# Patient Record
Sex: Female | Born: 1945 | Race: White | Hispanic: No | Smoking: Never smoker
Health system: Southern US, Community
[De-identification: ages and names within clinical notes are randomized; demographics above are authoritative.]

## PROBLEM LIST (undated history)

## (undated) DIAGNOSIS — K219 Gastro-esophageal reflux disease without esophagitis: Secondary | ICD-10-CM

## (undated) DIAGNOSIS — E785 Hyperlipidemia, unspecified: Secondary | ICD-10-CM

## (undated) DIAGNOSIS — E119 Type 2 diabetes mellitus without complications: Secondary | ICD-10-CM

## (undated) DIAGNOSIS — I10 Essential (primary) hypertension: Secondary | ICD-10-CM

## (undated) HISTORY — PX: ABDOMINAL HYSTERECTOMY: SHX81

## (undated) HISTORY — PX: CHOLECYSTECTOMY: SHX55

## (undated) HISTORY — DX: Hyperlipidemia, unspecified: E78.5

---

## 1998-12-17 ENCOUNTER — Emergency Department (HOSPITAL_COMMUNITY): Admission: EM | Admit: 1998-12-17 | Discharge: 1998-12-17 | Payer: Self-pay | Admitting: Emergency Medicine

## 2002-05-21 ENCOUNTER — Emergency Department (HOSPITAL_COMMUNITY): Admission: EM | Admit: 2002-05-21 | Discharge: 2002-05-21 | Payer: Self-pay | Admitting: Emergency Medicine

## 2002-05-21 ENCOUNTER — Encounter: Payer: Self-pay | Admitting: Emergency Medicine

## 2003-08-25 ENCOUNTER — Emergency Department (HOSPITAL_COMMUNITY): Admission: EM | Admit: 2003-08-25 | Discharge: 2003-08-25 | Payer: Self-pay | Admitting: Emergency Medicine

## 2003-12-30 ENCOUNTER — Encounter (INDEPENDENT_AMBULATORY_CARE_PROVIDER_SITE_OTHER): Payer: Self-pay | Admitting: *Deleted

## 2003-12-30 ENCOUNTER — Ambulatory Visit (HOSPITAL_COMMUNITY): Admission: RE | Admit: 2003-12-30 | Discharge: 2003-12-30 | Payer: Self-pay | Admitting: Gastroenterology

## 2005-04-26 ENCOUNTER — Emergency Department (HOSPITAL_COMMUNITY): Admission: EM | Admit: 2005-04-26 | Discharge: 2005-04-27 | Payer: Self-pay | Admitting: Emergency Medicine

## 2005-05-06 ENCOUNTER — Emergency Department (HOSPITAL_COMMUNITY): Admission: EM | Admit: 2005-05-06 | Discharge: 2005-05-06 | Payer: Self-pay | Admitting: Emergency Medicine

## 2009-07-07 ENCOUNTER — Encounter: Admission: RE | Admit: 2009-07-07 | Discharge: 2009-07-07 | Payer: Self-pay | Admitting: Family Medicine

## 2009-07-08 ENCOUNTER — Ambulatory Visit (HOSPITAL_COMMUNITY): Admission: RE | Admit: 2009-07-08 | Discharge: 2009-07-08 | Payer: Self-pay | Admitting: Family Medicine

## 2009-08-04 ENCOUNTER — Encounter: Admission: RE | Admit: 2009-08-04 | Discharge: 2009-08-04 | Payer: Self-pay | Admitting: Family Medicine

## 2010-02-16 ENCOUNTER — Encounter: Admission: RE | Admit: 2010-02-16 | Discharge: 2010-02-16 | Payer: Self-pay | Admitting: Otolaryngology

## 2010-08-14 NOTE — Op Note (Signed)
[name], [name]               ACCOUNT NO.:  [id number]   MEDICAL RECORD NO.:  [id number]          PATIENT TYPE:  AMB   LOCATION:  ENDO                         FACILITY:  [facility]   PHYSICIAN:  Anselmo Rod, M.D.  DATE OF BIRTH:  1945-04-15   DATE OF PROCEDURE:  12/30/2003  DATE OF DISCHARGE:                                 OPERATIVE REPORT   PROCEDURE PERFORMED:  Colonoscopy with snare polypectomy x1.   ENDOSCOPIST:  Anselmo Rod, M.D.   INSTRUMENT USED:  Olympus video colonoscope.   INDICATION FOR PROCEDURE:  [age]-year-old white female undergoing  screening colonoscopy to rule out colon polyps, masses, etc.   PREPROCEDURE PREPARATION:  Informed consent was procured from the patient.  The patient was fasted for 8 hours prior to the procedure and prepped with a  bottle of magnesium citrate and a gallon of GoLYTELY the night prior to the  procedure.   PREPROCEDURE PHYSICAL:  VITAL SIGNS:  The patient had stable vital signs.  NECK:  Neck supple.  CHEST:  Chest clear to auscultation.  S1 and S2 regular.  ABDOMEN:  Abdomen soft with normal bowel sounds.  No hepatosplenomegaly and  no masses palpable.   DESCRIPTION OF THE PROCEDURE:  The patient was placed in the left lateral  decubitus position and sedated with 75 mg of Demerol and 7.5 mg of Versed in  slow incremental doses.  Once the patient was adequately sedated and  maintained on low-flow oxygen and continuous cardiac monitoring, the Olympus  video colonoscope was advanced from the rectum to the cecum.  The  appendiceal orifice and the ileocecal valve were visualized and  photographed.  The patient's position was changed from the left lateral to  the supine and the right lateral position with gentle application of  abdominal pressure to reach the cecum.  Because of the patient's body  habitus and somewhat tortuous colon, the procedure was somewhat difficult.  A small flat polyp was snared from the proximal right  colon by hot snare.  There was extensive sigmoid diverticulosis with inspissated stool in several  of the diverticula.  Small internal hemorrhoids were seen on retroflexion.  The patient tolerated the procedure well without complications.   IMPRESSION:  1.  Small flat polyp snared from the proximal right colon by hot snare.  2.  Extensive sigmoid diverticulosis.  3.  Small internal hemorrhoids.   RECOMMENDATIONS:  1.  Continue a high-fiber diet with liberal fluid intake.  2.  Await pathology results.  3.  Avoid nonsteroidals including aspirin for the next 4 weeks.  4.  Outpatient followup in the next 2 weeks for further recommendations on      diverticulosis.       JNM/MEDQ  D:  12/30/2003  T:  12/30/2003  Job:  [id number]   cc:   Derenda Mis, [address] [place], [place], [place].D.

## 2010-08-14 NOTE — Op Note (Signed)
Jasmine Cohen, Jasmine Cohen               ACCOUNT NO.:  [id number]   MEDICAL RECORD NO.:  [id number]          PATIENT TYPE:  AMB   LOCATION:  ENDO                         FACILITY:  [facility]   PHYSICIAN:  Anselmo Rod, M.D.  DATE OF BIRTH:  1945-06-30   DATE OF PROCEDURE:  12/30/2003  DATE OF DISCHARGE:                                 OPERATIVE REPORT   PROCEDURE PERFORMED:  Colonoscopy with snare polypectomy x1.   ENDOSCOPIST:  Anselmo Rod, M.D.   INSTRUMENT USED:  Olympus video colonoscope.   INDICATIONS FOR PROCEDURE:  A 65 year old white female who underwent  screening colonoscopy.  The patient has occasional bright red blood per  rectum which she attributes to her hemorrhoids.   PREPROCEDURE PREPARATION:  Informed consent was procured from the patient.  The patient fasted for eight hours prior to the procedure and prepped with a  bottle of magnesium citrate and a gallop of GoLYTELY the night prior to the  procedure.   PREPROCEDURE PHYSICAL:  VITAL SIGNS:  Stable vital signs.  NECK:  Supple.  CHEST:  Clear to auscultation.  CARDIOVASCULAR:  S1 and S2 regular.  ABDOMEN:  Soft with normal bowel sounds.   DESCRIPTION OF PROCEDURE:  The patient was placed in left lateral decubitus  position, sedated with 75 mg of Demerol and 7.5 mg of Versed in slow  incremental doses.  Once the patient was adequately sedated and maintained  on low flow oxygen and continuous cardiac monitoring, the Olympus video  colonoscope was advanced from the rectum to the cecum with difficulty  because of the patient's ...   Dictation ended at this point.       JNM/MEDQ  D:  12/30/2003  T:  12/30/2003  Job:  [id number]   cc:   Derrek Gu, M.D.  [facility]  [address]

## 2012-11-27 ENCOUNTER — Encounter (HOSPITAL_COMMUNITY): Payer: Self-pay | Admitting: *Deleted

## 2012-11-27 ENCOUNTER — Emergency Department (HOSPITAL_COMMUNITY)
Admission: EM | Admit: 2012-11-27 | Discharge: 2012-11-27 | Disposition: A | Discharge status: Home / Self Care | Payer: Medicare Other | Attending: Emergency Medicine | Admitting: Emergency Medicine

## 2012-11-27 ENCOUNTER — Emergency Department (HOSPITAL_COMMUNITY): Payer: Medicare Other

## 2012-11-27 DIAGNOSIS — I10 Essential (primary) hypertension: Secondary | ICD-10-CM | POA: Insufficient documentation

## 2012-11-27 DIAGNOSIS — E1169 Type 2 diabetes mellitus with other specified complication: Secondary | ICD-10-CM | POA: Insufficient documentation

## 2012-11-27 DIAGNOSIS — Y929 Unspecified place or not applicable: Secondary | ICD-10-CM | POA: Insufficient documentation

## 2012-11-27 DIAGNOSIS — Y9389 Activity, other specified: Secondary | ICD-10-CM | POA: Insufficient documentation

## 2012-11-27 DIAGNOSIS — S91109A Unspecified open wound of unspecified toe(s) without damage to nail, initial encounter: Secondary | ICD-10-CM | POA: Insufficient documentation

## 2012-11-27 DIAGNOSIS — S8002XA Contusion of left knee, initial encounter: Secondary | ICD-10-CM

## 2012-11-27 DIAGNOSIS — W06XXXA Fall from bed, initial encounter: Secondary | ICD-10-CM | POA: Insufficient documentation

## 2012-11-27 DIAGNOSIS — S8000XA Contusion of unspecified knee, initial encounter: Secondary | ICD-10-CM | POA: Insufficient documentation

## 2012-11-27 DIAGNOSIS — W19XXXA Unspecified fall, initial encounter: Secondary | ICD-10-CM

## 2012-11-27 DIAGNOSIS — S91209A Unspecified open wound of unspecified toe(s) with damage to nail, initial encounter: Secondary | ICD-10-CM

## 2012-11-27 DIAGNOSIS — W1809XA Striking against other object with subsequent fall, initial encounter: Secondary | ICD-10-CM | POA: Insufficient documentation

## 2012-11-27 HISTORY — DX: Type 2 diabetes mellitus without complications: E11.9

## 2012-11-27 HISTORY — DX: Essential (primary) hypertension: I10

## 2012-11-27 MED ORDER — ACETAMINOPHEN 325 MG PO TABS
650.0000 mg | ORAL_TABLET | Freq: Once | ORAL | Status: AC
  Administered 2012-11-27: 650 mg via ORAL
  Filled 2012-11-27: qty 2

## 2012-11-27 MED ORDER — TRAMADOL HCL 50 MG PO TABS: 50.0000 mg | ORAL_TABLET | Freq: Three times a day (TID) | ORAL | Status: DC | PRN

## 2012-11-27 NOTE — ED Notes (Signed)
Patient transported to X-ray 

## 2012-11-27 NOTE — ED Notes (Signed)
Romeo Apple, MD, placed toenail into appropriate location and bound toe. MD instructed patient on care of toe and follow-up with a primary care doctor.

## 2012-11-27 NOTE — ED Provider Notes (Signed)
CSN: [id number]     Arrival date & time 11/27/12  3086 History   First MD Initiated Contact with Patient 11/27/12 [phone]     Chief Complaint  Patient presents with  . Foot Injury  . Knee Pain   (Consider location/radiation/quality/duration/timing/severity/associated sxs/prior Treatment) Patient is a 67 y.o. female presenting with foot injury and knee pain. The history is provided by the patient.  Foot Injury Location:  Foot and knee Injury: yes   Knee location:  L knee Foot location:  L foot Pain details:    Quality:  Aching   Radiates to:  Does not radiate   Severity:  Moderate   Onset quality:  Sudden   Duration:  1 hour   Timing:  Constant   Progression:  Unchanged Chronicity:  New Dislocation: no   Foreign body present:  No foreign bodies Prior injury to area: to foot yet, to knee no. Relieved by:  Nothing Ineffective treatments:  None tried Associated symptoms: no back pain, no fatigue, no fever, no neck pain and no numbness   Knee Pain Associated symptoms: no back pain, no fatigue, no fever, no neck pain and no numbness     Past Medical History  Diagnosis Date  . Hypertension   . Diabetes mellitus without complication    Past Surgical History  Procedure Laterality Date  . Cholecystectomy    . Abdominal hysterectomy     No family history on file. History  Substance Use Topics  . Smoking status: Never Smoker   . Smokeless tobacco: Not on file  . Alcohol Use: No   OB History   Grav Para Term Preterm Abortions TAB SAB Ect Mult Living                 Review of Systems  Constitutional: Negative for fever and fatigue.  HENT: Negative for congestion, drooling and neck pain.   Eyes: Negative for pain.  Respiratory: Negative for cough and shortness of breath.   Cardiovascular: Negative for chest pain.  Gastrointestinal: Negative for nausea, vomiting, abdominal pain and diarrhea.  Genitourinary: Negative for dysuria and hematuria.  Musculoskeletal: Negative for  back pain and gait problem.  Skin: Negative for color change.  Neurological: Negative for dizziness and headaches.  Hematological: Negative for adenopathy.  Psychiatric/Behavioral: Negative for behavioral problems.  All other systems reviewed and are negative.    Allergies  Codeine  Home Medications  No current outpatient prescriptions on file. BP 148/52  Pulse 76  Temp(Src) 98.6 F (37 C) (Oral)  Resp 20  SpO2 100% Physical Exam  Nursing note and vitals reviewed. Constitutional: She is oriented to person, place, and time. She appears well-developed and well-nourished.  HENT:  Head: Normocephalic.  Mouth/Throat: No oropharyngeal exudate.  Eyes: Conjunctivae and EOM are normal. Pupils are equal, round, and reactive to light.  Neck: Normal range of motion. Neck supple.  No vertebral ttp.   Cardiovascular: Normal rate, regular rhythm, normal heart sounds and intact distal pulses.  Exam reveals no gallop and no friction rub.   No murmur heard. Pulmonary/Chest: Effort normal and breath sounds normal. No respiratory distress. She has no wheezes.  Abdominal: Soft. Bowel sounds are normal. There is no tenderness. There is no rebound and no guarding.  Musculoskeletal: Normal range of motion. She exhibits no edema and no tenderness.       Feet:  Neurological: She is alert and oriented to person, place, and time.  Skin: Skin is warm and dry.  Psychiatric: She has  a normal mood and affect. Her behavior is normal.    ED Course  Procedures (including critical care time) Labs Review Labs Reviewed - No data to display Imaging Review Dg Pelvis 1-2 Views  11/27/2012   *RADIOLOGY REPORT*  Clinical Data: Fall with pelvic pain.  PELVIS - 1-2 VIEW  Comparison: None.  Findings: No acute fracture or diastasis is seen.  Degenerative changes are seen involving both hips, the sacroiliac joints and the pubic symphysis.  No bony lesions or destruction are identified. Soft tissues are unremarkable.   IMPRESSION: No acute pelvic fracture identified.   Original Report Authenticated By: Irish Lack, M.D.   Dg Tibia/fibula Left  11/27/2012   *RADIOLOGY REPORT*  Clinical Data: Foot injury.  LEFT TIBIA AND FIBULA - 2 VIEW  Comparison: No priors.  Findings: AP and lateral views of the left tibia and fibula demonstrate no acute displaced fractures.  Overlying soft tissues are unremarkable.  IMPRESSION: 1.  No acute radiographic abnormality of the left tibia or fibula.   Original Report Authenticated By: Trudie Reed, M.D.   Dg Ankle Complete Left  11/27/2012   *RADIOLOGY REPORT*  Clinical Data: Fall with left ankle pain.  LEFT ANKLE COMPLETE - 3+ VIEW  Comparison: None.  Findings: Moderate degenerative changes are seen involving the ankle mortise.  There is no evidence of acute fracture or dislocation.  Soft tissues are unremarkable.  No bony lesions are identified.  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  No acute fracture is identified.   Original Report Authenticated By: Irish Lack, M.D.   Dg Knee Complete 4 Views Left  11/27/2012   *RADIOLOGY REPORT*  Clinical Data: Fall with left knee pain.  LEFT KNEE - COMPLETE 4+ VIEW  Comparison: None.  Findings: Advanced osteoarthritis is noted involving all three joint spaces, particularly the lateral joint compartment and the patellofemoral joint.  No evidence of acute fracture, dislocation or joint effusion.  No bony lesions are identified.  Soft tissues are unremarkable.  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  No acute fracture identified.   Original Report Authenticated By: Irish Lack, M.D.   Dg Foot Complete Left  11/27/2012   *RADIOLOGY REPORT*  Clinical Data: Fall with left foot pain.  LEFT FOOT - COMPLETE 3+ VIEW  Comparison: None.  Findings: Degenerative changes are seen involving the toes and tarsal bones.  There is no evidence of acute fracture or dislocation.  Soft tissues are unremarkable.  No  bony lesions or erosions are identified.  IMPRESSION: No acute fracture.  Degenerative changes of the foot are present.   Original Report Authenticated By: Irish Lack, M.D.    MDM   1. Fall, initial encounter   2. Avulsed toenail, initial encounter   3. Contusion of left knee, initial encounter    7:20 AM 67 y.o. female pw accidental fall out of bed while having a nightmare pta. Pt fell onto left knee and left foot. Denies hitting head, no loc, fell onto carpet from 2-3 feet. Pt has partial dislocation of nail on 1st digit, left foot. Will get pain control, imaging to r/o fx, reduction of nail. Tetanus UTD.   I irrigated the nail bed w/ 1 L NS to remove any debris or clots. I reduced the nail plate. Placed the toe in a soft, bulky dressing.   9:10 AM: I interpreted/reviewed the labs and/or imaging which were non-contributory. Will place in hard soled shoe. Tramadol for pain control. I have discussed the diagnosis/risks/treatment options  with the patient and family and believe the pt to be eligible for discharge home to follow-up with pcp in 3-4 days. We also discussed returning to the ED immediately if new or worsening sx occur. We discussed the sx which are most concerning (e.g., worsening pain, fever) that necessitate immediate return. Any new prescriptions provided to the patient are listed below.    Junius Argyle, MD 11/27/12 1014

## 2012-11-27 NOTE — ED Notes (Signed)
Per EMS - pt reports she was having a dream and subsequently fell out of the bed, hit her foot on the floor and ripped her left great toenail almost completely off. Pt also admits to left knee pain. Pt A&Ox4 on arrival. Guaze dressing applied by EMS en route.

## 2012-11-27 NOTE — ED Notes (Signed)
Bed: JY78 Expected date:  Expected time:  Means of arrival:  Comments: EMS left toe nail injury, left knee injury from fall out of bed

## 2012-11-27 NOTE — ED Notes (Signed)
Pt presents w/ partially avulsed left great toenail and c/o left great toe pain and left knee pain. Pt admits to falling out of bed and hitting her toe on the floor. Pt A&Ox4.

## 2012-12-13 ENCOUNTER — Emergency Department (HOSPITAL_COMMUNITY)
Admission: EM | Admit: 2012-12-13 | Discharge: 2012-12-13 | Disposition: A | Discharge status: Home / Self Care | Payer: Medicare Other | Attending: Emergency Medicine | Admitting: Emergency Medicine

## 2012-12-13 ENCOUNTER — Encounter (HOSPITAL_COMMUNITY): Payer: Self-pay | Admitting: *Deleted

## 2012-12-13 ENCOUNTER — Emergency Department (HOSPITAL_COMMUNITY): Payer: Medicare Other

## 2012-12-13 DIAGNOSIS — Z79899 Other long term (current) drug therapy: Secondary | ICD-10-CM | POA: Insufficient documentation

## 2012-12-13 DIAGNOSIS — E119 Type 2 diabetes mellitus without complications: Secondary | ICD-10-CM | POA: Insufficient documentation

## 2012-12-13 DIAGNOSIS — R21 Rash and other nonspecific skin eruption: Secondary | ICD-10-CM | POA: Insufficient documentation

## 2012-12-13 DIAGNOSIS — F29 Unspecified psychosis not due to a substance or known physiological condition: Secondary | ICD-10-CM | POA: Insufficient documentation

## 2012-12-13 DIAGNOSIS — R410 Disorientation, unspecified: Secondary | ICD-10-CM

## 2012-12-13 DIAGNOSIS — R350 Frequency of micturition: Secondary | ICD-10-CM | POA: Insufficient documentation

## 2012-12-13 DIAGNOSIS — R443 Hallucinations, unspecified: Secondary | ICD-10-CM | POA: Insufficient documentation

## 2012-12-13 DIAGNOSIS — F19921 Other psychoactive substance use, unspecified with intoxication with delirium: Secondary | ICD-10-CM | POA: Insufficient documentation

## 2012-12-13 DIAGNOSIS — F411 Generalized anxiety disorder: Secondary | ICD-10-CM | POA: Insufficient documentation

## 2012-12-13 DIAGNOSIS — I10 Essential (primary) hypertension: Secondary | ICD-10-CM | POA: Insufficient documentation

## 2012-12-13 LAB — CBC WITH DIFFERENTIAL/PLATELET
Basophils Absolute: 0.1 10*3/uL (ref 0.0–0.1)
Basophils Relative: 1 % (ref 0–1)
Hemoglobin: 15.4 g/dL — ABNORMAL HIGH (ref 12.0–15.0)
Lymphocytes Relative: 21 % (ref 12–46)
MCH: 29.7 pg (ref 26.0–34.0)
MCV: 86.1 fL (ref 78.0–100.0)
Monocytes Absolute: 0.8 10*3/uL (ref 0.1–1.0)
Neutro Abs: 7.2 10*3/uL (ref 1.7–7.7)
Neutrophils Relative %: 70 % (ref 43–77)
Platelets: 398 10*3/uL (ref 150–400)
RDW: 13.1 % (ref 11.5–15.5)

## 2012-12-13 LAB — COMPREHENSIVE METABOLIC PANEL
Albumin: 3.5 g/dL (ref 3.5–5.2)
Alkaline Phosphatase: 78 U/L (ref 39–117)
CO2: 24 mEq/L (ref 19–32)
Calcium: 9.6 mg/dL (ref 8.4–10.5)
Chloride: 99 mEq/L (ref 96–112)
Creatinine, Ser: 1.1 mg/dL (ref 0.50–1.10)
GFR calc non Af Amer: 51 mL/min — ABNORMAL LOW (ref >90)
Sodium: 138 mEq/L (ref 135–145)
Total Bilirubin: 0.4 mg/dL (ref 0.3–1.2)

## 2012-12-13 LAB — URINALYSIS, ROUTINE W REFLEX MICROSCOPIC
Glucose, UA: NEGATIVE mg/dL
Specific Gravity, Urine: 1.025 (ref 1.005–1.030)
Urobilinogen, UA: 1 mg/dL (ref 0.0–1.0)
pH: 5.5 (ref 5.0–8.0)

## 2012-12-13 LAB — URINE MICROSCOPIC-ADD ON

## 2012-12-13 LAB — RAPID URINE DRUG SCREEN, HOSP PERFORMED: Opiates: NOT DETECTED

## 2012-12-13 MED ORDER — ONDANSETRON 4 MG PO TBDP
4.0000 mg | ORAL_TABLET | Freq: Once | ORAL | Status: AC
  Administered 2012-12-13: 4 mg via ORAL
  Filled 2012-12-13: qty 1

## 2012-12-13 NOTE — ED Notes (Signed)
Per son Pt called him last night around 1:30 am stating she saw someone in the back yard but they weren't really there, also was seeing spots on legs that aren't really there, pt has started new medications recently. Pts son states the pt has been thinking past couple weeks someone has been messing around her house, pt states "I saw what I saw", unable to state if it was real or not. Pt did have a fall on September 1st out of bed, was taking tramadol for that, then states was placed on a new medication to help get protein out of kidneys. Pt is alert/oriented person, place and month/year, unable to state date.

## 2012-12-13 NOTE — ED Provider Notes (Signed)
CSN: [id number]     Arrival date & time 12/13/12  1027 History   First MD Initiated Contact with Patient 12/13/12 1038     Chief Complaint  Patient presents with  . Altered Mental Status   (Consider location/radiation/quality/duration/timing/severity/associated sxs/prior Treatment) HPI Patient is brought in by her son for altered mental status evaluation. Son states that his mother called him multiple times throughout the night concerned that there were people roaming around her house. He states that she complaint on the hands of clocks being manipulated and that she is seeing spots her legs that disappeared. Patient has no history of psychiatric disorders. She states that she has not been herself recently. She's been up and down to the bathroom often times to urinate. Denies fevers or chills. She denies head or neck trauma. She denies alcohol or drug use. She was recently started on medication by her primary Dr. to "protect her kidneys". Patient lives by herself in a trailer. Past Medical History  Diagnosis Date  . Hypertension   . Diabetes mellitus without complication    Past Surgical History  Procedure Laterality Date  . Cholecystectomy    . Abdominal hysterectomy     No family history on file. History  Substance Use Topics  . Smoking status: Never Smoker   . Smokeless tobacco: Never Used  . Alcohol Use: No   OB History   Grav Para Term Preterm Abortions TAB SAB Ect Mult Living                 Review of Systems  Constitutional: Negative for fever and chills.  HENT: Negative for neck pain and neck stiffness.   Eyes: Negative for visual disturbance.  Respiratory: Negative for cough and shortness of breath.   Cardiovascular: Negative for chest pain.  Gastrointestinal: Negative for nausea, vomiting, abdominal pain, diarrhea and constipation.  Genitourinary: Positive for frequency. Negative for dysuria and difficulty urinating.  Musculoskeletal: Negative for myalgias and back  pain.  Skin: Positive for rash. Negative for wound.  Neurological: Negative for dizziness, weakness, light-headedness, numbness and headaches.  Psychiatric/Behavioral: Positive for hallucinations and confusion. The patient is nervous/anxious.   All other systems reviewed and are negative.    Allergies  Codeine and Contrast media  Home Medications   Current Outpatient Rx  Name  Route  Sig  Dispense  Refill  . hydrOXYzine (ATARAX/VISTARIL) 25 MG tablet   Oral   Take 25 mg by mouth 3 (three) times daily as needed for itching or anxiety.         Marland Kitchen lisinopril (PRINIVIL,ZESTRIL) 20 MG tablet   Oral   Take 10 mg by mouth daily.         . pravastatin (PRAVACHOL) 40 MG tablet   Oral   Take 80 mg by mouth at bedtime.         . traMADol (ULTRAM) 50 MG tablet   Oral   Take 50 mg by mouth every 6 (six) hours as needed for pain.         . Vitamin D, Ergocalciferol, (DRISDOL) 50000 UNITS CAPS capsule   Oral   Take 50,000 Units by mouth every 14 (fourteen) days.          BP 141/100  Pulse 81  Temp(Src) 98.1 F (36.7 C) (Oral)  Resp 18  SpO2 100% Physical Exam  Nursing note and vitals reviewed. Constitutional: She appears well-developed and well-nourished. No distress.  HENT:  Head: Normocephalic and atraumatic.  Mouth/Throat: Oropharynx is clear and  moist.  Eyes: EOM are normal. Pupils are equal, round, and reactive to light.  Neck: Normal range of motion. Neck supple.  No posterior cervical spine tenderness.  Cardiovascular: Normal rate and regular rhythm.   Pulmonary/Chest: Effort normal and breath sounds normal. No respiratory distress. She has no wheezes. She has no rales.  Abdominal: Soft. Bowel sounds are normal. She exhibits no distension and no mass. There is tenderness. There is no rebound (mild diffuse abdominal tenderness without focality) and no guarding.  Musculoskeletal: Normal range of motion. She exhibits no edema and no tenderness.  Neurological: She  is alert.  Patient is oriented to person and place. She incorrectly stated that the year was 2004. She does not know the day of the week. Patient is alert clear, goal oriented speech. Patient has 5/5 motor in all extremities. Sensation is intact to light touch.   Skin: Skin is warm and dry. No rash noted. No erythema.  Psychiatric: She has a normal mood and affect. Her behavior is normal.    ED Course  Procedures (including critical care time) Labs Review Labs Reviewed  CBC WITH DIFFERENTIAL - Abnormal; Notable for the following:    RBC 5.18 (*)    Hemoglobin 15.4 (*)    All other components within normal limits  COMPREHENSIVE METABOLIC PANEL - Abnormal; Notable for the following:    Glucose, Bld 139 (*)    GFR calc non Af Amer 51 (*)    GFR calc Af Amer 59 (*)    All other components within normal limits  URINALYSIS, ROUTINE W REFLEX MICROSCOPIC - Abnormal; Notable for the following:    APPearance CLOUDY (*)    Hgb urine dipstick TRACE (*)    Bilirubin Urine SMALL (*)    Ketones, ur 15 (*)    Leukocytes, UA SMALL (*)    All other components within normal limits  URINE MICROSCOPIC-ADD ON - Abnormal; Notable for the following:    Bacteria, UA FEW (*)    Casts HYALINE CASTS (*)    All other components within normal limits  URINE CULTURE  ETHANOL  URINE RAPID DRUG SCREEN (HOSP PERFORMED)   Imaging Review Ct Head Wo Contrast  12/13/2012   CLINICAL DATA:  Altered mental status.  EXAM: CT HEAD WITHOUT CONTRAST  TECHNIQUE: Contiguous axial images were obtained from the base of the skull through the vertex without intravenous contrast.  COMPARISON:  MRI 02/16/2010  FINDINGS: Ventricle size is normal. Negative for acute or chronic infarction. Negative for hemorrhage or mass. Calvarium intact.  IMPRESSION: Negative   Electronically Signed   By: Marlan Palau M.D.   On: 12/13/2012 11:24    MDM   Further history from the patient, she states she took her Vistaril last night at 10 PM  before her symptoms started. It was prescribed a year ago and she's only had 2 doses prior. The medication is out of date. She states that her delusions and hallucinations started roughly 2 hours after taking the medication. She is no longer hallucinating or delusional. She has goal oriented, clear speech. She does not appear to be responding to internal stimuli. Son states she is much closer to her baseline mental status. Psychiatric evaluation offered but the son and patient declined this at this point given that it now appears her symptoms are due to the Vistaril. Discharge the patient home with her son to observe for 2 sure complete symptom resolution. They've been given strict instructions to return to the emergency department for  any worsening of her mental status, fevers, headache, stiff neck, weakness or any concerns. Both the patient and her son state they understand the discharge instructions and agree with plan.  Loren Racer, MD 12/13/12 1339

## 2012-12-13 NOTE — Progress Notes (Signed)
Pt was discharged at 1343. Denice Bors, [facility] 12/13/2012 5:40 PM

## 2012-12-13 NOTE — ED Notes (Signed)
Patient transported to CT 

## 2012-12-13 NOTE — [facility] Counselor (Signed)
Writer informed the extender at [facility], Denice Bors (NP) that the ER has called regarding a consult for this patient.

## 2012-12-14 LAB — URINE CULTURE
Colony Count: NO GROWTH
Culture: NO GROWTH

## 2013-12-13 ENCOUNTER — Other Ambulatory Visit (HOSPITAL_COMMUNITY): Payer: Self-pay | Admitting: Family Medicine

## 2013-12-13 ENCOUNTER — Ambulatory Visit (HOSPITAL_COMMUNITY)
Admission: RE | Admit: 2013-12-13 | Discharge: 2013-12-13 | Disposition: A | Discharge status: Home / Self Care | Payer: Medicare Other | Source: Ambulatory Visit | Attending: Family Medicine | Admitting: Family Medicine

## 2013-12-13 DIAGNOSIS — M79609 Pain in unspecified limb: Secondary | ICD-10-CM | POA: Insufficient documentation

## 2013-12-13 DIAGNOSIS — R599 Enlarged lymph nodes, unspecified: Secondary | ICD-10-CM | POA: Insufficient documentation

## 2013-12-13 DIAGNOSIS — M25561 Pain in right knee: Secondary | ICD-10-CM

## 2013-12-13 DIAGNOSIS — M25569 Pain in unspecified knee: Secondary | ICD-10-CM | POA: Insufficient documentation

## 2013-12-13 NOTE — Progress Notes (Signed)
VASCULAR LAB PRELIMINARY  PRELIMINARY  PRELIMINARY  PRELIMINARY  Right lower extremity venous Doppler completed.    Preliminary report:  There is no DVT noted in the right lower extremity.    Rilei Kravitz, RVT 12/13/2013, 4:54 PM

## 2014-03-26 ENCOUNTER — Inpatient Hospital Stay (HOSPITAL_COMMUNITY)
Admission: EM | Admit: 2014-03-26 | Discharge: 2014-03-27 | DRG: 392 | Disposition: A | Discharge status: Home / Self Care | Payer: Medicare Other | Attending: Internal Medicine | Admitting: Internal Medicine

## 2014-03-26 ENCOUNTER — Emergency Department (HOSPITAL_COMMUNITY): Payer: Medicare Other

## 2014-03-26 ENCOUNTER — Encounter (HOSPITAL_COMMUNITY): Payer: Self-pay | Admitting: Emergency Medicine

## 2014-03-26 DIAGNOSIS — Z885 Allergy status to narcotic agent status: Secondary | ICD-10-CM

## 2014-03-26 DIAGNOSIS — R109 Unspecified abdominal pain: Secondary | ICD-10-CM | POA: Diagnosis present

## 2014-03-26 DIAGNOSIS — E119 Type 2 diabetes mellitus without complications: Secondary | ICD-10-CM

## 2014-03-26 DIAGNOSIS — D72829 Elevated white blood cell count, unspecified: Secondary | ICD-10-CM | POA: Diagnosis present

## 2014-03-26 DIAGNOSIS — R05 Cough: Secondary | ICD-10-CM

## 2014-03-26 DIAGNOSIS — E785 Hyperlipidemia, unspecified: Secondary | ICD-10-CM | POA: Diagnosis present

## 2014-03-26 DIAGNOSIS — R7989 Other specified abnormal findings of blood chemistry: Secondary | ICD-10-CM | POA: Diagnosis present

## 2014-03-26 DIAGNOSIS — R059 Cough, unspecified: Secondary | ICD-10-CM

## 2014-03-26 LAB — CBC WITH DIFFERENTIAL/PLATELET
Basophils Absolute: 0 10*3/uL (ref 0.0–0.1)
Basophils Relative: 0 % (ref 0–1)
Eosinophils Absolute: 0.1 10*3/uL (ref 0.0–0.7)
Eosinophils Relative: 1 % (ref 0–5)
HCT: 46.1 % — ABNORMAL HIGH (ref 36.0–46.0)
Hemoglobin: 15.3 g/dL — ABNORMAL HIGH (ref 12.0–15.0)
Lymphocytes Relative: 5 % — ABNORMAL LOW (ref 12–46)
Lymphs Abs: 1 10*3/uL (ref 0.7–4.0)
MCH: 30.4 pg (ref 26.0–34.0)
MCHC: 33.2 g/dL (ref 30.0–36.0)
MCV: 91.7 fL (ref 78.0–100.0)
Monocytes Absolute: 1.6 10*3/uL — ABNORMAL HIGH (ref 0.1–1.0)
Monocytes Relative: 8 % (ref 3–12)
Neutro Abs: 16.6 10*3/uL — ABNORMAL HIGH (ref 1.7–7.7)
Neutrophils Relative %: 86 % — ABNORMAL HIGH (ref 43–77)
Platelets: 359 10*3/uL (ref 150–400)
RBC: 5.03 MIL/uL (ref 3.87–5.11)
RDW: 13 % (ref 11.5–15.5)
WBC: 19.3 10*3/uL — ABNORMAL HIGH (ref 4.0–10.5)

## 2014-03-26 LAB — COMPREHENSIVE METABOLIC PANEL
ALT: 44 U/L — ABNORMAL HIGH (ref 0–35)
AST: 46 U/L — AB (ref 0–37)
Albumin: 4.5 g/dL (ref 3.5–5.2)
Alkaline Phosphatase: 114 U/L (ref 39–117)
Anion gap: 7 (ref 5–15)
BILIRUBIN TOTAL: 1.1 mg/dL (ref 0.3–1.2)
BUN: 22 mg/dL (ref 6–23)
CO2: 27 mmol/L (ref 19–32)
CREATININE: 0.9 mg/dL (ref 0.50–1.10)
Calcium: 9.4 mg/dL (ref 8.4–10.5)
Chloride: 105 mEq/L (ref 96–112)
GFR calc Af Amer: 74 mL/min — ABNORMAL LOW (ref >90)
GFR calc non Af Amer: 64 mL/min — ABNORMAL LOW (ref >90)
Glucose, Bld: 127 mg/dL — ABNORMAL HIGH (ref 70–99)
Potassium: 4.1 mmol/L (ref 3.5–5.1)
Sodium: 139 mmol/L (ref 135–145)
Total Protein: 7.7 g/dL (ref 6.0–8.3)

## 2014-03-26 LAB — URINALYSIS, ROUTINE W REFLEX MICROSCOPIC
Glucose, UA: NEGATIVE mg/dL
Hgb urine dipstick: NEGATIVE
Ketones, ur: NEGATIVE mg/dL
Leukocytes, UA: NEGATIVE
Nitrite: NEGATIVE
Protein, ur: NEGATIVE mg/dL
Specific Gravity, Urine: 1.035 — ABNORMAL HIGH (ref 1.005–1.030)
Urobilinogen, UA: 1 mg/dL (ref 0.0–1.0)
pH: 5.5 (ref 5.0–8.0)

## 2014-03-26 MED ORDER — ONDANSETRON HCL 4 MG/2ML IJ SOLN
4.0000 mg | Freq: Once | INTRAMUSCULAR | Status: AC
  Administered 2014-03-27: 4 mg via INTRAVENOUS
  Filled 2014-03-26: qty 2

## 2014-03-26 MED ORDER — SODIUM CHLORIDE 0.9 % IV BOLUS (SEPSIS)
500.0000 mL | Freq: Once | INTRAVENOUS | Status: AC
Start: 2014-03-26 — End: 2014-03-27
  Administered 2014-03-27: 500 mL via INTRAVENOUS

## 2014-03-26 NOTE — ED Notes (Addendum)
Per EMS woke up from nap with nausea and abdominal pain, vomited small amount in route with EMS-states she felt better after belching and vomiting-CBG 113-EMS states patient a bit dramatic, states this time is very stressful for her

## 2014-03-27 ENCOUNTER — Encounter (HOSPITAL_COMMUNITY): Payer: Self-pay | Admitting: Emergency Medicine

## 2014-03-27 DIAGNOSIS — R109 Unspecified abdominal pain: Secondary | ICD-10-CM | POA: Diagnosis present

## 2014-03-27 DIAGNOSIS — D72829 Elevated white blood cell count, unspecified: Secondary | ICD-10-CM | POA: Diagnosis present

## 2014-03-27 DIAGNOSIS — E785 Hyperlipidemia, unspecified: Secondary | ICD-10-CM | POA: Diagnosis present

## 2014-03-27 DIAGNOSIS — E119 Type 2 diabetes mellitus without complications: Secondary | ICD-10-CM

## 2014-03-27 DIAGNOSIS — R7989 Other specified abnormal findings of blood chemistry: Secondary | ICD-10-CM | POA: Diagnosis present

## 2014-03-27 DIAGNOSIS — Z885 Allergy status to narcotic agent status: Secondary | ICD-10-CM | POA: Diagnosis not present

## 2014-03-27 DIAGNOSIS — R1084 Generalized abdominal pain: Secondary | ICD-10-CM

## 2014-03-27 LAB — I-STAT TROPONIN, ED: TROPONIN I, POC: 0 ng/mL (ref 0.00–0.08)

## 2014-03-27 LAB — COMPREHENSIVE METABOLIC PANEL
ALBUMIN: 3.5 g/dL (ref 3.5–5.2)
ALT: 35 U/L (ref 0–35)
ANION GAP: 5 (ref 5–15)
AST: 33 U/L (ref 0–37)
Alkaline Phosphatase: 87 U/L (ref 39–117)
BILIRUBIN TOTAL: 0.7 mg/dL (ref 0.3–1.2)
BUN: 17 mg/dL (ref 6–23)
CO2: 26 mmol/L (ref 19–32)
Calcium: 8.1 mg/dL — ABNORMAL LOW (ref 8.4–10.5)
Chloride: 106 mEq/L (ref 96–112)
Creatinine, Ser: 0.84 mg/dL (ref 0.50–1.10)
GFR calc Af Amer: 81 mL/min — ABNORMAL LOW (ref >90)
GFR calc non Af Amer: 70 mL/min — ABNORMAL LOW (ref >90)
GLUCOSE: 124 mg/dL — AB (ref 70–99)
Potassium: 3.4 mmol/L — ABNORMAL LOW (ref 3.5–5.1)
Sodium: 137 mmol/L (ref 135–145)
Total Protein: 5.9 g/dL — ABNORMAL LOW (ref 6.0–8.3)

## 2014-03-27 LAB — CBC WITH DIFFERENTIAL/PLATELET
Basophils Absolute: 0 10*3/uL (ref 0.0–0.1)
Basophils Relative: 0 % (ref 0–1)
EOS PCT: 2 % (ref 0–5)
Eosinophils Absolute: 0.2 10*3/uL (ref 0.0–0.7)
HEMATOCRIT: 39 % (ref 36.0–46.0)
Hemoglobin: 12.9 g/dL (ref 12.0–15.0)
LYMPHS ABS: 2 10*3/uL (ref 0.7–4.0)
LYMPHS PCT: 21 % (ref 12–46)
MCH: 30.3 pg (ref 26.0–34.0)
MCHC: 33.1 g/dL (ref 30.0–36.0)
MCV: 91.5 fL (ref 78.0–100.0)
MONO ABS: 0.8 10*3/uL (ref 0.1–1.0)
Monocytes Relative: 8 % (ref 3–12)
NEUTROS ABS: 6.7 10*3/uL (ref 1.7–7.7)
Neutrophils Relative %: 69 % (ref 43–77)
PLATELETS: 278 10*3/uL (ref 150–400)
RBC: 4.26 MIL/uL (ref 3.87–5.11)
RDW: 13.1 % (ref 11.5–15.5)
WBC: 9.7 10*3/uL (ref 4.0–10.5)

## 2014-03-27 LAB — GLUCOSE, CAPILLARY
GLUCOSE-CAPILLARY: 104 mg/dL — AB (ref 70–99)
GLUCOSE-CAPILLARY: 134 mg/dL — AB (ref 70–99)

## 2014-03-27 LAB — HEPATITIS PANEL, ACUTE
HCV Ab: NEGATIVE
HEP A IGM: NONREACTIVE
Hep B C IgM: NONREACTIVE
Hepatitis B Surface Ag: NEGATIVE

## 2014-03-27 LAB — I-STAT CG4 LACTIC ACID, ED: Lactic Acid, Venous: 0.99 mmol/L (ref 0.5–2.2)

## 2014-03-27 LAB — LACTIC ACID, PLASMA: LACTIC ACID, VENOUS: 1.1 mmol/L (ref 0.5–2.2)

## 2014-03-27 MED ORDER — FAMOTIDINE 20 MG PO TABS
20.0000 mg | ORAL_TABLET | Freq: Every day | ORAL | Status: DC
  Administered 2014-03-27: 20 mg via ORAL
  Filled 2014-03-27: qty 1

## 2014-03-27 MED ORDER — SODIUM CHLORIDE 0.9 % IV SOLN
INTRAVENOUS | Status: DC
  Administered 2014-03-27: 04:00:00 via INTRAVENOUS

## 2014-03-27 MED ORDER — METRONIDAZOLE 500 MG PO TABS: 500.0000 mg | ORAL_TABLET | Freq: Three times a day (TID) | ORAL | Status: DC

## 2014-03-27 MED ORDER — POTASSIUM CHLORIDE CRYS ER 20 MEQ PO TBCR
40.0000 meq | EXTENDED_RELEASE_TABLET | Freq: Once | ORAL | Status: AC
  Administered 2014-03-27: 40 meq via ORAL
  Filled 2014-03-27: qty 2

## 2014-03-27 MED ORDER — INSULIN ASPART 100 UNIT/ML ~~LOC~~ SOLN: 0.0000 [IU] | Freq: Three times a day (TID) | SUBCUTANEOUS | Status: DC

## 2014-03-27 MED ORDER — ONDANSETRON HCL 4 MG/2ML IJ SOLN: 4.0000 mg | Freq: Four times a day (QID) | INTRAMUSCULAR | Status: DC | PRN

## 2014-03-27 MED ORDER — LISINOPRIL 10 MG PO TABS
10.0000 mg | ORAL_TABLET | Freq: Every day | ORAL | Status: DC
  Administered 2014-03-27: 10 mg via ORAL
  Filled 2014-03-27: qty 1

## 2014-03-27 MED ORDER — METRONIDAZOLE IN NACL 5-0.79 MG/ML-% IV SOLN
500.0000 mg | Freq: Once | INTRAVENOUS | Status: AC
  Administered 2014-03-27: 500 mg via INTRAVENOUS
  Filled 2014-03-27: qty 100

## 2014-03-27 MED ORDER — ENOXAPARIN SODIUM 60 MG/0.6ML ~~LOC~~ SOLN
50.0000 mg | SUBCUTANEOUS | Status: DC
  Administered 2014-03-27: 50 mg via SUBCUTANEOUS
  Filled 2014-03-27: qty 0.6

## 2014-03-27 MED ORDER — METRONIDAZOLE IN NACL 5-0.79 MG/ML-% IV SOLN
500.0000 mg | Freq: Three times a day (TID) | INTRAVENOUS | Status: DC
  Filled 2014-03-27: qty 100

## 2014-03-27 MED ORDER — CIPROFLOXACIN HCL 500 MG PO TABS: 500.0000 mg | ORAL_TABLET | Freq: Two times a day (BID) | ORAL | Status: DC

## 2014-03-27 MED ORDER — ONDANSETRON HCL 4 MG PO TABS: 4.0000 mg | ORAL_TABLET | Freq: Four times a day (QID) | ORAL | Status: DC | PRN

## 2014-03-27 MED ORDER — ATORVASTATIN CALCIUM 40 MG PO TABS
40.0000 mg | ORAL_TABLET | Freq: Every day | ORAL | Status: DC
  Filled 2014-03-27: qty 1

## 2014-03-27 MED ORDER — ACETAMINOPHEN 325 MG PO TABS: 650.0000 mg | ORAL_TABLET | Freq: Four times a day (QID) | ORAL | Status: DC | PRN

## 2014-03-27 MED ORDER — CIPROFLOXACIN IN D5W 400 MG/200ML IV SOLN
400.0000 mg | Freq: Once | INTRAVENOUS | Status: AC
  Administered 2014-03-27: 400 mg via INTRAVENOUS
  Filled 2014-03-27: qty 200

## 2014-03-27 MED ORDER — ACETAMINOPHEN 650 MG RE SUPP: 650.0000 mg | Freq: Four times a day (QID) | RECTAL | Status: DC | PRN

## 2014-03-27 MED ORDER — CIPROFLOXACIN IN D5W 400 MG/200ML IV SOLN
400.0000 mg | Freq: Two times a day (BID) | INTRAVENOUS | Status: DC
  Filled 2014-03-27: qty 200

## 2014-03-27 NOTE — Discharge Summary (Signed)
Physician Discharge Summary  Mikle BosworthLucille P Haller FAO:[id number] DOB: 07/09/1945 DOA: 03/26/2014  PCP: Kaleen MaskELKINS,WILSON OLIVER, MD  Admit date: 03/26/2014 Discharge date: 03/27/2014  Recommendations for Outpatient Follow-up:  1. Continue cipro and flagyl for 14 days on discharge and follow up with Dr. Loreta AveMann in 1 week from discharge.  Discharge Diagnoses:  Principal Problem:   Abdominal pain Active Problems:   Leucocytosis   Diabetes mellitus type 2, controlled   Hyperlipidemia    Discharge Condition: stable   Diet recommendation: as tolerated   History of present illness:  68 y.o. female history of diabetes mellitus type 2, hyperlipidemia who presented to the ER because of abdominal discomfort with nausea and vomiting. Patient's symptoms started 1 day prior to this admission. CT abdomen and pelvis showed nonspecific findings in the root of the small bowel mesentery concerning for panniculitis. Given patient's nausea vomiting and leukocytosis patient has been admitted for further management.   Hospital Course:   Principal Problem:    Abdominal pain / Leucocytosis - spoke with Dr. Elnoria HowardHung of GI who recommended cipro and flagyl on discharge and follow up with GI outpatient in 1-2 weeks after discharge - this was communicated with the pt -she feels better this am and prefers to go home today. Active Problems:   Diabetes mellitus type 2, controlled - controlled    Hyperlipidemia - continue statin therapy      Signed:  Manson PasseyEVINE, Joselynn Amoroso, MD  [facility] 03/27/2014, 10:32 AM  Pager #: [phone]  Procedures:  None   Consultations:  GI - phone call only   Discharge Exam: Filed Vitals:   03/27/14 0856  BP: 143/71  Pulse:   Temp:   Resp:    Filed Vitals:   03/27/14 0200 03/27/14 0300 03/27/14 0407 03/27/14 0856  BP: 148/67  175/70 143/71  Pulse: 70  104   Temp:   98 F (36.7 C)   TempSrc:   Oral   Resp: 17  19   Height:  5\' 8"  (1.727 m)    Weight:  108.863 kg  (240 lb)    SpO2: 99%  100%     General: Pt is alert, follows commands appropriately, not in acute distress Cardiovascular: Regular rate and rhythm, S1/S2 +, no murmurs Respiratory: Clear to auscultation bilaterally, no wheezing, no crackles, no rhonchi Abdominal: Soft, non tender, non distended, bowel sounds +, no guarding Extremities: no edema, no cyanosis, pulses palpable bilaterally DP and PT Neuro: Grossly nonfocal  Discharge Instructions  Discharge Instructions    Call MD for:  difficulty breathing, headache or visual disturbances    Complete by:  As directed      Call MD for:  persistant dizziness or light-headedness    Complete by:  As directed      Call MD for:  persistant nausea and vomiting    Complete by:  As directed      Call MD for:  severe uncontrolled pain    Complete by:  As directed      Diet - low sodium heart healthy    Complete by:  As directed      Discharge instructions    Complete by:  As directed   1. Continue cipro and flagyl for 14 days on discharge and follow up with Dr. Loreta AveMann in 1 week from discharge.     Increase activity slowly    Complete by:  As directed             Medication List    TAKE  these medications        atorvastatin 40 MG tablet  Commonly known as:  LIPITOR     ciprofloxacin 500 MG tablet  Commonly known as:  CIPRO  Take 1 tablet (500 mg total) by mouth 2 (two) times daily.     lisinopril 20 MG tablet  Commonly known as:  PRINIVIL,ZESTRIL  Take 10 mg by mouth daily.     meloxicam 15 MG tablet  Commonly known as:  MOBIC  Take 7.5-15 mg by mouth 2 (two) times daily as needed for pain.     metroNIDAZOLE 500 MG tablet  Commonly known as:  FLAGYL  Take 1 tablet (500 mg total) by mouth 3 (three) times daily.     ranitidine 150 MG tablet  Commonly known as:  ZANTAC  Take 150-300 mg by mouth every morning.     Vitamin D (Ergocalciferol) 50000 UNITS Caps capsule  Commonly known as:  DRISDOL  Take 50,000 Units by mouth  every 14 (fourteen) days.          The results of significant diagnostics from this hospitalization (including imaging, microbiology, ancillary and laboratory) are listed below for reference.    Significant Diagnostic Studies: Dg Chest 2 View  03/27/2014   CLINICAL DATA:  Cough.  EXAM: CHEST  2 VIEW  COMPARISON:  07/08/2009  FINDINGS: Normal heart size and Stable mediastinal contours. No acute infiltrate or edema. No effusion or pneumothorax. No acute osseous findings.  IMPRESSION: No pneumonia.   Electronically Signed   By: Tiburcio Pea M.D.   On: 03/27/2014 00:04   Ct Renal Stone Study  03/26/2014   CLINICAL DATA:  69 year old female with acute onset of nausea and global abdominal pain earlier today, with some emesis.  EXAM: CT ABDOMEN AND PELVIS WITHOUT CONTRAST  TECHNIQUE: Multidetector CT imaging of the abdomen and pelvis was performed following the standard protocol without IV contrast.  COMPARISON:  No priors.  FINDINGS: Lower chest: Atherosclerotic calcifications in the left anterior descending, left circumflex and right coronary arteries.  Hepatobiliary: No definite cystic or solid hepatic lesions on today's non contrast CT examination. Status post cholecystectomy.  Pancreas: Unremarkable.  Spleen: Unremarkable.  Adrenals/Urinary Tract: There are no abnormal calcifications within the collecting system of either kidney, along the course of either ureter, or within the lumen of the urinary bladder. No hydroureteronephrosis or perinephric stranding to suggest urinary tract obstruction at this time. The unenhanced appearance of the kidneys is unremarkable bilaterally. Bilateral adrenal glands are normal in appearance.  Stomach/Bowel: Normal appearance of the stomach. No pathologic dilatation of small bowel or colon. 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 Normal appendix. Haziness in the root of the small bowel mesentery where there are multiple prominent but nonenlarged mesenteric lymph nodes.  Vascular/Lymphatic: Atherosclerotic calcifications throughout the abdominal and pelvic vasculature, without evidence of aneurysm. No lymphadenopathy noted in the abdomen or pelvis on today's non contrast CT examination.  Reproductive: Status post hysterectomy.  Ovaries are atrophic.  Other: No significant volume of ascites.  No pneumoperitoneum.  Musculoskeletal: There are no aggressive appearing lytic or blastic lesions noted in the visualized portions of the skeleton.  IMPRESSION: 1. No acute findings in the abdomen or pelvis to account for the patient's symptoms. 2. Specifically, no urinary tract calculi and no findings of urinary tract obstruction. 3. Nonspecific haziness in the root of the small bowel mesentery with multiple  prominent but nonenlarged mesenteric lymph nodes. These findings are nonspecific, but can be seen in the setting of mesenteric panniculitis, which could be a source of chronic abdominal pain. This is unlikely to be related to the acute patient's acute presentation. 4. Atherosclerosis, including 3 vessel coronary artery disease. Please note that although the presence of coronary artery calcium documents the presence of coronary artery disease, the severity of this disease and any potential stenosis cannot be assessed on this non-gated CT examination. Assessment for potential risk factor modification, dietary therapy or pharmacologic therapy may be warranted, if clinically indicated.   Electronically Signed   By: Trudie Reedaniel  Entrikin M.D.   On: 03/26/2014 23:57    Microbiology: No results found for this or any previous visit (from the past 240 hour(s)).   Labs: Basic Metabolic Panel:  Recent Labs Lab 03/26/14 1817 03/27/14 0650  NA 139 137  K 4.1 3.4*  CL 105 106  CO2 27 26  GLUCOSE 127* 124*  BUN 22 17  CREATININE 0.90 0.84   CALCIUM 9.4 8.1*   Liver Function Tests:  Recent Labs Lab 03/26/14 1817 03/27/14 0650  AST 46* 33  ALT 44* 35  ALKPHOS 114 87  BILITOT 1.1 0.7  PROT 7.7 5.9*  ALBUMIN 4.5 3.5   No results for input(s): LIPASE, AMYLASE in the last 168 hours. No results for input(s): AMMONIA in the last 168 hours. CBC:  Recent Labs Lab 03/26/14 1817 03/27/14 0650  WBC 19.3* 9.7  NEUTROABS 16.6* 6.7  HGB 15.3* 12.9  HCT 46.1* 39.0  MCV 91.7 91.5  PLT 359 278   Cardiac Enzymes: No results for input(s): CKTOTAL, CKMB, CKMBINDEX, TROPONINI in the last 168 hours. BNP: BNP (last 3 results) No results for input(s): PROBNP in the last 8760 hours. CBG:  Recent Labs Lab 03/27/14 0729  GLUCAP 104*    Time coordinating discharge: Over 30 minutes

## 2014-03-27 NOTE — ED Provider Notes (Signed)
CSN: [id number]     Arrival date & time 03/26/14  1740 History   First MD Initiated Contact with Patient 03/26/14 2312     Chief Complaint  Patient presents with  . Abdominal Pain     (Consider location/radiation/quality/duration/timing/severity/associated sxs/prior Treatment) Patient is a 68 y.o. female presenting with abdominal pain. The history is provided by the patient.  Abdominal Pain Pain location:  L flank and LLQ Pain quality: cramping   Pain radiates to:  Does not radiate Pain severity:  Moderate Onset quality:  Gradual Timing:  Constant Progression:  Unchanged Chronicity:  New Context: retching   Context: not trauma   Relieved by:  Nothing Worsened by:  Nothing tried Ineffective treatments:  None tried Associated symptoms: cough and vomiting   Associated symptoms: no anorexia, no constipation, no fever and no shortness of breath   Risk factors: being elderly     Past Medical History  Diagnosis Date  . Hypertension   . Diabetes mellitus without complication    Past Surgical History  Procedure Laterality Date  . Cholecystectomy    . Abdominal hysterectomy     History reviewed. No pertinent family history. History  Substance Use Topics  . Smoking status: Never Smoker   . Smokeless tobacco: Never Used  . Alcohol Use: No   OB History    No data available     Review of Systems  Constitutional: Negative for fever.  Respiratory: Positive for cough. Negative for shortness of breath.   Gastrointestinal: Positive for vomiting and abdominal pain. Negative for constipation and anorexia.  All other systems reviewed and are negative.     Allergies  Codeine; Contrast media; and Vistaril  Home Medications   Prior to Admission medications   Medication Sig Start Date End Date Taking? Authorizing Provider  atorvastatin (LIPITOR) 40 MG tablet  12/24/13  Yes Historical Provider, MD  lisinopril (PRINIVIL,ZESTRIL) 20 MG tablet Take 10 mg by mouth daily.   Yes  Historical Provider, MD  meloxicam (MOBIC) 15 MG tablet Take 7.5-15 mg by mouth 2 (two) times daily as needed for pain.  03/20/14  Yes Historical Provider, MD  ranitidine (ZANTAC) 150 MG tablet Take 150-300 mg by mouth every morning.  02/01/14  Yes Historical Provider, MD  Vitamin D, Ergocalciferol, (DRISDOL) 50000 UNITS CAPS capsule Take 50,000 Units by mouth every 14 (fourteen) days.   Yes Historical Provider, MD   BP 157/67 mmHg  Pulse 67  Temp(Src) 97.8 F (36.6 C) (Oral)  Resp 20  SpO2 100% Physical Exam  Constitutional: She is oriented to person, place, and time. She appears well-developed and well-nourished.  HENT:  Head: Normocephalic and atraumatic.  Mouth/Throat: Oropharynx is clear and moist.  Eyes: Conjunctivae are normal. Pupils are equal, round, and reactive to light.  Neck: Normal range of motion. Neck supple.  Cardiovascular: Normal rate, regular rhythm and intact distal pulses.   Pulmonary/Chest: Effort normal and breath sounds normal. No respiratory distress. She has no wheezes. She has no rales.  Abdominal: Soft. She exhibits no distension. Bowel sounds are increased. There is tenderness. There is no rigidity, no rebound, no guarding, no tenderness at McBurney's point and negative Murphy's sign.  Musculoskeletal: Normal range of motion. She exhibits no edema or tenderness.  Neurological: She is alert and oriented to person, place, and time.  Skin: Skin is warm and dry. She is not diaphoretic.  Psychiatric: She has a normal mood and affect.    ED Course  Procedures (including critical care time) Labs  Review Labs Reviewed  CBC WITH DIFFERENTIAL - Abnormal; Notable for the following:    WBC 19.3 (*)    Hemoglobin 15.3 (*)    HCT 46.1 (*)    Neutrophils Relative % 86 (*)    Neutro Abs 16.6 (*)    Lymphocytes Relative 5 (*)    Monocytes Absolute 1.6 (*)    All other components within normal limits  COMPREHENSIVE METABOLIC PANEL - Abnormal; Notable for the  following:    Glucose, Bld 127 (*)    AST 46 (*)    ALT 44 (*)    GFR calc non Af Amer 64 (*)    GFR calc Af Amer 74 (*)    All other components within normal limits  URINALYSIS, ROUTINE W REFLEX MICROSCOPIC - Abnormal; Notable for the following:    Specific Gravity, Urine 1.035 (*)    Bilirubin Urine SMALL (*)    All other components within normal limits  I-STAT TROPOININ, ED    Imaging Review Dg Chest 2 View  03/27/2014   CLINICAL DATA:  Cough.  EXAM: CHEST  2 VIEW  COMPARISON:  07/08/2009  FINDINGS: Normal heart size and Stable mediastinal contours. No acute infiltrate or edema. No effusion or pneumothorax. No acute osseous findings.  IMPRESSION: No pneumonia.   Electronically Signed   By: Tiburcio Pea M.D.   On: 03/27/2014 00:04   Ct Renal Stone Study  03/26/2014   CLINICAL DATA:  68 year old female with acute onset of nausea and global abdominal pain earlier today, with some emesis.  EXAM: CT ABDOMEN AND PELVIS WITHOUT CONTRAST  TECHNIQUE: Multidetector CT imaging of the abdomen and pelvis was performed following the standard protocol without IV contrast.  COMPARISON:  No priors.  FINDINGS: Lower chest: Atherosclerotic calcifications in the left anterior descending, left circumflex and right coronary arteries.  Hepatobiliary: No definite cystic or solid hepatic lesions on today's non contrast CT examination. Status post cholecystectomy.  Pancreas: Unremarkable.  Spleen: Unremarkable.  Adrenals/Urinary Tract: There are no abnormal calcifications within the collecting system of either kidney, along the course of either ureter, or within the lumen of the urinary bladder. No hydroureteronephrosis or perinephric stranding to suggest urinary tract obstruction at this time. The unenhanced appearance of the kidneys is unremarkable bilaterally. Bilateral adrenal glands are normal in appearance.  Stomach/Bowel: Normal appearance of the stomach. No pathologic dilatation of small bowel or colon.  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 Normal appendix. Haziness in the root of the small bowel mesentery where there are multiple prominent but nonenlarged mesenteric lymph nodes.  Vascular/Lymphatic: Atherosclerotic calcifications throughout the abdominal and pelvic vasculature, without evidence of aneurysm. No lymphadenopathy noted in the abdomen or pelvis on today's non contrast CT examination.  Reproductive: Status post hysterectomy.  Ovaries are atrophic.  Other: No significant volume of ascites.  No pneumoperitoneum.  Musculoskeletal: There are no aggressive appearing lytic or blastic lesions noted in the visualized portions of the skeleton.  IMPRESSION: 1. No acute findings in the abdomen or pelvis to account for the patient's symptoms. 2. Specifically, no urinary tract calculi and no findings of urinary tract obstruction. 3. Nonspecific haziness in the root of the small bowel mesentery with multiple prominent but nonenlarged mesenteric lymph nodes. These findings are nonspecific, but can be seen in the setting of mesenteric panniculitis, which could be a source of chronic abdominal pain. This is unlikely to be  related to the acute patient's acute presentation. 4. Atherosclerosis, including 3 vessel coronary artery disease. Please note that although the presence of coronary artery calcium documents the presence of coronary artery disease, the severity of this disease and any potential stenosis cannot be assessed on this non-gated CT examination. Assessment for potential risk factor modification, dietary therapy or pharmacologic therapy may be warranted, if clinically indicated.   Electronically Signed   By: Trudie Reedaniel  Entrikin M.D.   On: 03/26/2014 23:57     EKG Interpretation   Date/Time:  Tuesday March 26 2014 22:59:19 EST Ventricular Rate:  62 PR  Interval:  156 QRS Duration: 86 QT Interval:  448 QTC Calculation: 455 R Axis:   59 Text Interpretation:  Sinus rhythm Confirmed by [facility]  MD, Marysa Wessner  ([id number]) on 03/26/2014 11:34:37 PM      MDM   Final diagnoses:  Cough   Medications  ciprofloxacin (CIPRO) IVPB 400 mg (not administered)  metroNIDAZOLE (FLAGYL) IVPB 500 mg (not administered)  sodium chloride 0.9 % bolus 500 mL (500 mLs Intravenous New Bag/Given 03/27/14 0015)  ondansetron (ZOFRAN) injection 4 mg (4 mg Intravenous Given 03/27/14 0016)       Marigold Mom K Kyo Cocuzza-Rasch, MD 03/27/14 [phone]

## 2014-03-27 NOTE — Progress Notes (Signed)
Spoke with Dr. Elnoria HowardHung who recommended abx on discharge and follow up with Dr. Loreta AveMann. Manson Passeylma Brecklyn [place] [facility] [phone]

## 2014-03-27 NOTE — ED Notes (Addendum)
Pt I-stat troponin drawn an hour ago RN was with another pt forgetting tube was in pocket. Blood redrawn by mini lab.

## 2014-03-27 NOTE — H&P (Signed)
[facility] History and Physical  Jasmine BosworthLucille P Buhl WUJ:[id number] DOB: 09/29/1945 DOA: 03/26/2014  Referring physician: ER physician. PCP: Kaleen MaskELKINS,WILSON OLIVER, MD   Chief Complaint: Abdominal discomfort and nausea vomiting.  HPI: Jasmine Cohen is a 68 y.o. female history of diabetes mellitus type 2, hyperlipidemia presented to the ER because of abdominal discomfort with nausea vomiting. Patient's symptoms started last evening. Denies any chest pain or shortness of breath or any fever chills or diarrhea. Denies any new medications. In the ER patient's labs revealed leukocytosis but patient was afebrile. CT abdomen and pelvis showed nonspecific findings in the root of the small bowel mesentery concerning for panniculitis. Given patient's nausea vomiting and leukocytosis patient has been admitted for further management. On exam patient's abdomen is benign. Patient Arixtra on Cipro and Flagyl.   Review of Systems: As presented in the history of presenting illness, rest negative.  Past Medical History  Diagnosis Date  . Hypertension   . Diabetes mellitus without complication    Past Surgical History  Procedure Laterality Date  . Cholecystectomy    . Abdominal hysterectomy     Social History:  reports that she has never smoked. She has never used smokeless tobacco. She reports that she does not drink alcohol or use illicit drugs. Where does patient live home. Can patient participate in ADLs? Yes.  Allergies  Allergen Reactions  . Codeine     Numbness and tingling  . Contrast Media [Iodinated Diagnostic Agents] Hives  . Vistaril [Hydroxyzine] Other (See Comments)    hallucinations    Family History:  Family History  Problem Relation Age of Onset  . Breast cancer Mother   . Diabetes Mellitus II Neg Hx       Prior to Admission medications   Medication Sig Start Date End Date Taking? Authorizing Provider  atorvastatin (LIPITOR) 40 MG tablet  12/24/13  Yes Historical  Provider, MD  lisinopril (PRINIVIL,ZESTRIL) 20 MG tablet Take 10 mg by mouth daily.   Yes Historical Provider, MD  meloxicam (MOBIC) 15 MG tablet Take 7.5-15 mg by mouth 2 (two) times daily as needed for pain.  03/20/14  Yes Historical Provider, MD  ranitidine (ZANTAC) 150 MG tablet Take 150-300 mg by mouth every morning.  02/01/14  Yes Historical Provider, MD  Vitamin D, Ergocalciferol, (DRISDOL) 50000 UNITS CAPS capsule Take 50,000 Units by mouth every 14 (fourteen) days.   Yes Historical Provider, MD    Physical Exam: Filed Vitals:   03/26/14 2300 03/27/14 0147 03/27/14 0200 03/27/14 0300  BP: 157/67 144/65 148/67   Pulse: 67 68 70   Temp:      TempSrc:      Resp: 20 18 17    Height:    5\' 8"  (1.727 m)  Weight:    108.863 kg (240 lb)  SpO2: 100% 100% 99%      General:  Well-developed and nourished.  Eyes: Anicteric no pallor.  ENT: No discharge from the ears eyes nose or mouth.  Neck: No mass felt.  Cardiovascular: S1 and S2 heard.  Respiratory: No rhonchi or crepitations.  Abdomen: Soft nontender bowel sounds present. No guarding or rigidity.  Skin: No rash.  Musculoskeletal: No edema.  Psychiatric: Appears normal.  Neurologic: Alert awake oriented to time place and person. Moves all extremities.  Labs on Admission:  Basic Metabolic Panel:  Recent Labs Lab 03/26/14 1817  NA 139  K 4.1  CL 105  CO2 27  GLUCOSE 127*  BUN 22  CREATININE 0.90  CALCIUM  9.4   Liver Function Tests:  Recent Labs Lab 03/26/14 1817  AST 46*  ALT 44*  ALKPHOS 114  BILITOT 1.1  PROT 7.7  ALBUMIN 4.5   No results for input(s): LIPASE, AMYLASE in the last 168 hours. No results for input(s): AMMONIA in the last 168 hours. CBC:  Recent Labs Lab 03/26/14 1817  WBC 19.3*  NEUTROABS 16.6*  HGB 15.3*  HCT 46.1*  MCV 91.7  PLT 359   Cardiac Enzymes: No results for input(s): CKTOTAL, CKMB, CKMBINDEX, TROPONINI in the last 168 hours.  BNP (last 3 results) No results  for input(s): PROBNP in the last 8760 hours. CBG: No results for input(s): GLUCAP in the last 168 hours.  Radiological Exams on Admission: Dg Chest 2 View  03/27/2014   CLINICAL DATA:  Cough.  EXAM: CHEST  2 VIEW  COMPARISON:  07/08/2009  FINDINGS: Normal heart size and Stable mediastinal contours. No acute infiltrate or edema. No effusion or pneumothorax. No acute osseous findings.  IMPRESSION: No pneumonia.   Electronically Signed   By: Tiburcio PeaJonathan  Watts M.D.   On: 03/27/2014 00:04   Ct Renal Stone Study  03/26/2014   CLINICAL DATA:  68 year old female with acute onset of nausea and global abdominal pain earlier today, with some emesis.  EXAM: CT ABDOMEN AND PELVIS WITHOUT CONTRAST  TECHNIQUE: Multidetector CT imaging of the abdomen and pelvis was performed following the standard protocol without IV contrast.  COMPARISON:  No priors.  FINDINGS: Lower chest: Atherosclerotic calcifications in the left anterior descending, left circumflex and right coronary arteries.  Hepatobiliary: No definite cystic or solid hepatic lesions on today's non contrast CT examination. Status post cholecystectomy.  Pancreas: Unremarkable.  Spleen: Unremarkable.  Adrenals/Urinary Tract: There are no abnormal calcifications within the collecting system of either kidney, along the course of either ureter, or within the lumen of the urinary bladder. No hydroureteronephrosis or perinephric stranding to suggest urinary tract obstruction at this time. The unenhanced appearance of the kidneys is unremarkable bilaterally. Bilateral adrenal glands are normal in appearance.  Stomach/Bowel: Normal appearance of the stomach. No pathologic dilatation of small bowel or colon. 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 Normal appendix. Haziness in the root of the small bowel mesentery where there are  multiple prominent but nonenlarged mesenteric lymph nodes.  Vascular/Lymphatic: Atherosclerotic calcifications throughout the abdominal and pelvic vasculature, without evidence of aneurysm. No lymphadenopathy noted in the abdomen or pelvis on today's non contrast CT examination.  Reproductive: Status post hysterectomy.  Ovaries are atrophic.  Other: No significant volume of ascites.  No pneumoperitoneum.  Musculoskeletal: There are no aggressive appearing lytic or blastic lesions noted in the visualized portions of the skeleton.  IMPRESSION: 1. No acute findings in the abdomen or pelvis to account for the patient's symptoms. 2. Specifically, no urinary tract calculi and no findings of urinary tract obstruction. 3. Nonspecific haziness in the root of the small bowel mesentery with multiple prominent but nonenlarged mesenteric lymph nodes. These findings are nonspecific, but can be seen in the setting of mesenteric panniculitis, which could be a source of chronic abdominal pain. This is unlikely to be related to the acute patient's acute presentation. 4. Atherosclerosis, including 3 vessel coronary artery disease. Please note that although the presence of coronary artery calcium documents the presence of coronary artery disease, the severity of this disease and any potential stenosis cannot be assessed on  this non-gated CT examination. Assessment for potential risk factor modification, dietary therapy or pharmacologic therapy may be warranted, if clinically indicated.   Electronically Signed   By: Trudie Reed M.D.   On: 03/26/2014 23:57    EKG: Independently reviewed. Normal sinus rhythm.  Assessment/Plan Principal Problem:   Abdominal pain Active Problems:   Leucocytosis   Diabetes mellitus type 2, controlled   Hyperlipidemia   1. 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 Patient has been placed on ciprofloxacin clear  liquid diet and IV fluids for now. May discuss with patient's gastroenterologist Dr. Loreta Ave in a.m. Patient states she has had a colonoscopy last year which was unremarkable. 2. Leukocytosis - patient is afebrile. Follow CBC closely. See #1. 3. Diabetes mellitus type 2 - patient is usually on diet control. Patient has been placed on sliding scale coverage for now. 4. Hyperlipidemia - on statins. Patient's LFTs mildly elevated and if LFTs show an increasing trend in statins should be held. 5. Elevated LFTs - patient has had previous history of cholecystectomy. Check acute hepatitis panel and follow LFTs. See #4.   DVT Prophylaxis Lovenox.  Code Status: Full code.  Family Communication: Patient's son thru the phone. Patient's son lives in [place] state.  Disposition Plan: Admit to inpatient.    S[address] [place]ow[place] N. [facility] Pager [phone].  If 7PM-7AM, please contact night-coverage [email or web address] Password [email or web address] 03/27/2014, 4:07 AM

## 2014-03-27 NOTE — ED Notes (Signed)
RN informed this Clinical research associatewriter labs would be collected by Charity fundraiserN.

## 2014-03-27 NOTE — Progress Notes (Signed)
ANTIBIOTIC CONSULT NOTE - INITIAL  Pharmacy Consult for Cipro Indication: Intra-abdominal infection  Allergies  Allergen Reactions  . Codeine     Numbness and tingling  . Contrast Media [Iodinated Diagnostic Agents] Hives  . Vistaril [Hydroxyzine] Other (See Comments)    hallucinations    Patient Measurements: Height: 5\' 8"  (172.7 cm) Weight: 240 lb (108.863 kg) IBW/kg (Calculated) : 63.9  Vital Signs: Temp: 98 F (36.7 C) (12/30 0407) Temp Source: Oral (12/30 0407) BP: 175/70 mmHg (12/30 0407) Pulse Rate: 104 (12/30 0407) Intake/Output from previous day: 12/29 0701 - 12/30 0700 In: -  Out: 300 [Urine:300] Intake/Output from this shift: Total I/O In: -  Out: 300 [Urine:300]  Labs:  Recent Labs  03/26/14 1817  WBC 19.3*  HGB 15.3*  PLT 359  CREATININE 0.90   Estimated Creatinine Clearance: 77.4 mL/min (by C-G formula based on Cr of 0.9). No results for input(s): VANCOTROUGH, VANCOPEAK, VANCORANDOM, GENTTROUGH, GENTPEAK, GENTRANDOM, TOBRATROUGH, TOBRAPEAK, TOBRARND, AMIKACINPEAK, AMIKACINTROU, AMIKACIN in the last 72 hours.   Microbiology: No results found for this or any previous visit (from the past 720 hour(s)).  Medical History: Past Medical History  Diagnosis Date  . Hypertension   . Diabetes mellitus without complication     Medications:  Scheduled:  . atorvastatin  40 mg Oral q1800  . ciprofloxacin  400 mg Intravenous Once  . ciprofloxacin  400 mg Intravenous Q12H  . enoxaparin (LOVENOX) injection  50 mg Subcutaneous Q24H  . famotidine  20 mg Oral Daily  . insulin aspart  0-9 Units Subcutaneous TID WC  . lisinopril  10 mg Oral Daily  . metronidazole  500 mg Intravenous Once  . metronidazole  500 mg Intravenous Q8H   Infusions:  . sodium chloride     PRN: acetaminophen **OR** acetaminophen, ondansetron **OR** ondansetron (ZOFRAN) IV  Assessment: 68 yo female presents with nausea and abdominal pain. Pharmacy is consulted to dose cipro for  possible intra-abdominal infection; MD dosing metronidazole.  12/30 >> Cipro >> 12/30 >> Flagyl >>  Tmax: Afebrile WBC: 19.3k Renal: SCr 0.9, CrCl 77 ml/min  No cultures ordered  Goal of Therapy:  Eradication of infection Dose appropriate for renal function  Plan:   Cipro 400mg  IV q12h Follow up renal function & cultures  Loralee PacasErin Antwyne Pingree, PharmD, BCPS Pharmacy: [phone] 03/27/2014,4:11 AM

## 2014-03-28 NOTE — Care Management Utilization Note (Signed)
UR Complete.  Marji Kuehnel, BSN, CM [phone]. 

## 2014-08-07 ENCOUNTER — Ambulatory Visit
Admission: RE | Admit: 2014-08-07 | Discharge: 2014-08-07 | Disposition: A | Discharge status: Home / Self Care | Payer: Medicare Other | Source: Ambulatory Visit | Attending: Family Medicine | Admitting: Family Medicine

## 2014-08-07 ENCOUNTER — Other Ambulatory Visit: Payer: Self-pay | Admitting: Family Medicine

## 2014-08-07 DIAGNOSIS — R0781 Pleurodynia: Secondary | ICD-10-CM

## 2014-09-01 ENCOUNTER — Emergency Department (HOSPITAL_COMMUNITY): Payer: Medicare Other

## 2014-09-01 ENCOUNTER — Encounter (HOSPITAL_COMMUNITY): Payer: Self-pay | Admitting: Emergency Medicine

## 2014-09-01 ENCOUNTER — Emergency Department (HOSPITAL_COMMUNITY)
Admission: EM | Admit: 2014-09-01 | Discharge: 2014-09-02 | Disposition: A | Discharge status: Home / Self Care | Payer: Medicare Other | Attending: Emergency Medicine | Admitting: Emergency Medicine

## 2014-09-01 DIAGNOSIS — E119 Type 2 diabetes mellitus without complications: Secondary | ICD-10-CM | POA: Insufficient documentation

## 2014-09-01 DIAGNOSIS — Z792 Long term (current) use of antibiotics: Secondary | ICD-10-CM | POA: Diagnosis not present

## 2014-09-01 DIAGNOSIS — Z79899 Other long term (current) drug therapy: Secondary | ICD-10-CM | POA: Insufficient documentation

## 2014-09-01 DIAGNOSIS — R05 Cough: Secondary | ICD-10-CM | POA: Insufficient documentation

## 2014-09-01 DIAGNOSIS — R059 Cough, unspecified: Secondary | ICD-10-CM

## 2014-09-01 DIAGNOSIS — I10 Essential (primary) hypertension: Secondary | ICD-10-CM | POA: Insufficient documentation

## 2014-09-01 MED ORDER — IPRATROPIUM-ALBUTEROL 0.5-2.5 (3) MG/3ML IN SOLN
3.0000 mL | RESPIRATORY_TRACT | Status: DC
  Administered 2014-09-01 – 2014-09-02 (×2): 3 mL via RESPIRATORY_TRACT
  Filled 2014-09-01 (×2): qty 3

## 2014-09-01 NOTE — ED Provider Notes (Signed)
CSN: [id number]     Arrival date & time 09/01/14  2246 History   First MD Initiated Contact with Patient 09/01/14 2302     Chief Complaint  Patient presents with  . Cough     (Consider location/radiation/quality/duration/timing/severity/associated sxs/prior Treatment) HPI Jasmine Cohen is a 69 y.o. female comes in for evaluation of cough. Patient states she started coughing on May 4 has been intermittently coughing since then. She reports associated brownish yellow sputum production. She denies any hemoptysis. She reports seeing her primary care doctor who started her on benzonatate, which did not help. She was also started on a promethazine syrup which helped somewhat. She also reports she saw her PCP and the again on Friday and was started on amoxicillin, took her first dose yesterday. She denies fevers, chills, nausea or vomiting. She does report associated chest discomfort when she coughs, but not at rest. Denies any leg swelling, recent travel or surgeries.  Past Medical History  Diagnosis Date  . Hypertension   . Diabetes mellitus without complication    Past Surgical History  Procedure Laterality Date  . Cholecystectomy    . Abdominal hysterectomy     Family History  Problem Relation Age of Onset  . Breast cancer Mother   . Diabetes Mellitus II Neg Hx    History  Substance Use Topics  . Smoking status: Never Smoker   . Smokeless tobacco: Never Used  . Alcohol Use: No   OB History    No data available     Review of Systems A 10 point review of systems was completed and was negative except for pertinent positives and negatives as mentioned in the history of present illness     Allergies  Codeine; Contrast media; and Vistaril  Home Medications   Prior to Admission medications   Medication Sig Start Date End Date Taking? Authorizing Provider  atorvastatin (LIPITOR) 40 MG tablet  12/24/13   Historical Provider, MD  ciprofloxacin (CIPRO) 500 MG tablet Take 1 tablet  (500 mg total) by mouth 2 (two) times daily. 03/27/14   Alison MurrayAlma M Devine, MD  guaiFENesin-dextromethorphan (ROBITUSSIN DM) 100-10 MG/5ML syrup Take 5 mLs by mouth every 4 (four) hours as needed for cough. 09/02/14   Joycie PeekBenjamin Ademide Schaberg, PA-C  lisinopril (PRINIVIL,ZESTRIL) 20 MG tablet Take 10 mg by mouth daily.    Historical Provider, MD  meloxicam (MOBIC) 15 MG tablet Take 7.5-15 mg by mouth 2 (two) times daily as needed for pain.  03/20/14   Historical Provider, MD  metroNIDAZOLE (FLAGYL) 500 MG tablet Take 1 tablet (500 mg total) by mouth 3 (three) times daily. 03/27/14   Alison MurrayAlma M Devine, MD  ranitidine (ZANTAC) 150 MG tablet Take 150-300 mg by mouth every morning.  02/01/14   Historical Provider, MD  Vitamin D, Ergocalciferol, (DRISDOL) 50000 UNITS CAPS capsule Take 50,000 Units by mouth every 14 (fourteen) days.    Historical Provider, MD   BP 141/68 mmHg  Pulse 90  Temp(Src) 98 F (36.7 C) (Oral)  Resp 18  SpO2 97% Physical Exam  Constitutional: She is oriented to person, place, and time. She appears well-developed and well-nourished.  HENT:  Head: Normocephalic and atraumatic.  Mouth/Throat: Oropharynx is clear and moist.  Eyes: Conjunctivae are normal. Pupils are equal, round, and reactive to light. Right eye exhibits no discharge. Left eye exhibits no discharge. No scleral icterus.  Neck: Neck supple.  Cardiovascular: Normal rate, regular rhythm and normal heart sounds.   Pulmonary/Chest: Effort normal and breath sounds  normal. No respiratory distress. She has no wheezes. She has no rales.  Patient actively coughing. Diffuse wheezing throughout lung fields. No tachypnea, hypoxia or other evidence of respiratory distress.  Abdominal: Soft. There is no tenderness.  Musculoskeletal: She exhibits no tenderness.  Neurological: She is alert and oriented to person, place, and time.  Cranial Nerves II-XII grossly intact  Skin: Skin is warm and dry. No rash noted.  Psychiatric: She has a normal  mood and affect.  Nursing note and vitals reviewed.   ED Course  Procedures (including critical care time) Labs Review Labs Reviewed - No data to display  Imaging Review Dg Chest 2 View  09/02/2014   CLINICAL DATA:  Cough for 1 month.  EXAM: CHEST  2 VIEW  COMPARISON:  08/07/2014.  FINDINGS: The heart size and mediastinal contours are within normal limits. Both lungs are clear. The visualized skeletal structures are unremarkable.  IMPRESSION: No active cardiopulmonary disease. Unchanged from prior chest radiograph.   Electronically Signed   By: Davonna Belling M.D.   On: 09/02/2014 00:26     EKG Interpretation None     Meds given in ED:  Medications  ipratropium-albuterol (DUONEB) 0.5-2.5 (3) MG/3ML nebulizer solution 3 mL (3 mLs Nebulization Given 09/02/14 0044)  albuterol (PROVENTIL HFA;VENTOLIN HFA) 108 (90 BASE) MCG/ACT inhaler 2 puff (not administered)    New Prescriptions   GUAIFENESIN-DEXTROMETHORPHAN (ROBITUSSIN DM) 100-10 MG/5ML SYRUP    Take 5 mLs by mouth every 4 (four) hours as needed for cough.   Filed Vitals:   09/01/14 2246 09/01/14 2250  BP:  141/68  Pulse:  90  Temp:  98 F (36.7 C)  TempSrc:  Oral  Resp:  18  SpO2: 98% 97%     MDM  Vitals stable - WNL -afebrile Pt resting comfortably in ED. PE--mild diffuse wheezing. Patient actively coughing. No unilateral leg swelling, calf tenderness or erythema. Imaging--chest x-ray shows no acute cardiopulmonary disease and is unchanged from prior chest x-ray.  DDX--patient likely suffering from bronchitis/viral process. Recently placed on antibiotics by her PCP, encouraged to complete trial. Reports she feels better after breathing treatments in ED. Will DC with MDI, antitussives. Low concern for bacterial infection at this time. No evidence of PE, low Well's score.  I discussed all relevant lab findings and imaging results with pt and they verbalized understanding. Discussed f/u with PCP within 48 hrs and return  precautions, pt very amenable to plan.  Final diagnoses:  Cough        Joycie Peek, PA-C 09/02/14 0125  Tomasita Crumble, MD 09/02/14 [phone]

## 2014-09-01 NOTE — ED Notes (Signed)
Pt arrives with c/o cough, states she been coughing x1 month and was seen by PCP who gave suppressent. Pt was seen two days ago for same by PCP, XRAY was negative, given amoxicillin and promethazine. Started again at 2030, cough dry and nonproductive, pt dry heaves as well.

## 2014-09-02 MED ORDER — GUAIFENESIN-DM 100-10 MG/5ML PO SYRP: 5.0000 mL | ORAL_SOLUTION | ORAL | Status: DC | PRN

## 2014-09-02 MED ORDER — ALBUTEROL SULFATE HFA 108 (90 BASE) MCG/ACT IN AERS
2.0000 | INHALATION_SPRAY | Freq: Once | RESPIRATORY_TRACT | Status: AC
  Administered 2014-09-02: 2 via RESPIRATORY_TRACT
  Filled 2014-09-02: qty 6.7

## 2014-09-02 MED ORDER — IPRATROPIUM-ALBUTEROL 0.5-2.5 (3) MG/3ML IN SOLN: 3.0000 mL | RESPIRATORY_TRACT | Status: DC

## 2014-09-02 NOTE — Discharge Instructions (Signed)
Please follow-up with your primary care for further evaluation and management of your symptoms. Please usually inhaler as needed for your wheezing. Take your medications as prescribed for your cough. Return to ED for new or worsening symptoms.  Cough, Adult  A cough is a reflex that helps clear your throat and airways. It can help heal the body or may be a reaction to an irritated airway. A cough may only last 2 or 3 weeks (acute) or may last more than 8 weeks (chronic).  CAUSES Acute cough:  Viral or bacterial infections. Chronic cough:  Infections.  Allergies.  Asthma.  Post-nasal drip.  Smoking.  Heartburn or acid reflux.  Some medicines.  Chronic lung problems (COPD).  Cancer. SYMPTOMS   Cough.  Fever.  Chest pain.  Increased breathing rate.  High-pitched whistling sound when breathing (wheezing).  Colored mucus that you cough up (sputum). TREATMENT   A bacterial cough may be treated with antibiotic medicine.  A viral cough must run its course and will not respond to antibiotics.  Your caregiver may recommend other treatments if you have a chronic cough. HOME CARE INSTRUCTIONS   Only take over-the-counter or prescription medicines for pain, discomfort, or fever as directed by your caregiver. Use cough suppressants only as directed by your caregiver.  Use a cold steam vaporizer or humidifier in your bedroom or home to help loosen secretions.  Sleep in a semi-upright position if your cough is worse at night.  Rest as needed.  Stop smoking if you smoke. SEEK IMMEDIATE MEDICAL CARE IF:   You have pus in your sputum.  Your cough starts to worsen.  You cannot control your cough with suppressants and are losing sleep.  You begin coughing up blood.  You have difficulty breathing.  You develop pain which is getting worse or is uncontrolled with medicine.  You have a fever. MAKE SURE YOU:   Understand these instructions.  Will watch your  condition.  Will get help right away if you are not doing well or get worse. Document Released: 09/11/2010 Document Revised: 06/07/2011 Document Reviewed: 09/11/2010 [facility] Patient Information 2015 [place], [place]. This information is not intended to replace advice given to you by your health care provider. Make sure you discuss any questions you have with your health care provider.

## 2014-11-18 ENCOUNTER — Emergency Department (HOSPITAL_COMMUNITY)
Admission: EM | Admit: 2014-11-18 | Discharge: 2014-11-18 | Disposition: A | Discharge status: Home / Self Care | Payer: Medicare Other | Attending: Emergency Medicine | Admitting: Emergency Medicine

## 2014-11-18 ENCOUNTER — Emergency Department (HOSPITAL_COMMUNITY): Payer: Medicare Other

## 2014-11-18 ENCOUNTER — Encounter (HOSPITAL_COMMUNITY): Payer: Self-pay | Admitting: Emergency Medicine

## 2014-11-18 DIAGNOSIS — R1013 Epigastric pain: Secondary | ICD-10-CM | POA: Insufficient documentation

## 2014-11-18 DIAGNOSIS — R110 Nausea: Secondary | ICD-10-CM | POA: Insufficient documentation

## 2014-11-18 DIAGNOSIS — R61 Generalized hyperhidrosis: Secondary | ICD-10-CM | POA: Diagnosis not present

## 2014-11-18 DIAGNOSIS — Z8709 Personal history of other diseases of the respiratory system: Secondary | ICD-10-CM | POA: Insufficient documentation

## 2014-11-18 DIAGNOSIS — F419 Anxiety disorder, unspecified: Secondary | ICD-10-CM | POA: Insufficient documentation

## 2014-11-18 DIAGNOSIS — K219 Gastro-esophageal reflux disease without esophagitis: Secondary | ICD-10-CM | POA: Insufficient documentation

## 2014-11-18 DIAGNOSIS — R0602 Shortness of breath: Secondary | ICD-10-CM | POA: Diagnosis present

## 2014-11-18 DIAGNOSIS — R079 Chest pain, unspecified: Secondary | ICD-10-CM | POA: Diagnosis not present

## 2014-11-18 DIAGNOSIS — Z7982 Long term (current) use of aspirin: Secondary | ICD-10-CM | POA: Diagnosis not present

## 2014-11-18 DIAGNOSIS — I10 Essential (primary) hypertension: Secondary | ICD-10-CM | POA: Insufficient documentation

## 2014-11-18 DIAGNOSIS — E876 Hypokalemia: Secondary | ICD-10-CM | POA: Insufficient documentation

## 2014-11-18 DIAGNOSIS — R202 Paresthesia of skin: Secondary | ICD-10-CM | POA: Insufficient documentation

## 2014-11-18 DIAGNOSIS — E119 Type 2 diabetes mellitus without complications: Secondary | ICD-10-CM | POA: Diagnosis not present

## 2014-11-18 DIAGNOSIS — Z79899 Other long term (current) drug therapy: Secondary | ICD-10-CM | POA: Diagnosis not present

## 2014-11-18 HISTORY — DX: Gastro-esophageal reflux disease without esophagitis: K21.9

## 2014-11-18 LAB — I-STAT TROPONIN, ED
TROPONIN I, POC: 0 ng/mL (ref 0.00–0.08)
Troponin i, poc: 0 ng/mL (ref 0.00–0.08)

## 2014-11-18 LAB — BASIC METABOLIC PANEL
Anion gap: 12 (ref 5–15)
BUN: 14 mg/dL (ref 6–20)
CO2: 24 mmol/L (ref 22–32)
Calcium: 9.1 mg/dL (ref 8.9–10.3)
Chloride: 105 mmol/L (ref 101–111)
Creatinine, Ser: 1.05 mg/dL — ABNORMAL HIGH (ref 0.44–1.00)
GFR calc non Af Amer: 53 mL/min — ABNORMAL LOW (ref >60)
GLUCOSE: 120 mg/dL — AB (ref 65–99)
POTASSIUM: 3.4 mmol/L — AB (ref 3.5–5.1)
Sodium: 141 mmol/L (ref 135–145)

## 2014-11-18 LAB — CBC
HEMATOCRIT: 40.3 % (ref 36.0–46.0)
HEMOGLOBIN: 13.7 g/dL (ref 12.0–15.0)
MCH: 29.7 pg (ref 26.0–34.0)
MCHC: 34 g/dL (ref 30.0–36.0)
MCV: 87.2 fL (ref 78.0–100.0)
Platelets: 380 10*3/uL (ref 150–400)
RBC: 4.62 MIL/uL (ref 3.87–5.11)
RDW: 13.6 % (ref 11.5–15.5)
WBC: 7.6 10*3/uL (ref 4.0–10.5)

## 2014-11-18 LAB — LIPASE, BLOOD: Lipase: 32 U/L (ref 22–51)

## 2014-11-18 LAB — BRAIN NATRIURETIC PEPTIDE: B NATRIURETIC PEPTIDE 5: 12.5 pg/mL (ref 0.0–100.0)

## 2014-11-18 MED ORDER — PANTOPRAZOLE SODIUM 40 MG IV SOLR
40.0000 mg | Freq: Once | INTRAVENOUS | Status: AC
  Administered 2014-11-18: 40 mg via INTRAVENOUS
  Filled 2014-11-18: qty 40

## 2014-11-18 MED ORDER — SODIUM CHLORIDE 0.9 % IV BOLUS (SEPSIS)
500.0000 mL | Freq: Once | INTRAVENOUS | Status: AC
  Administered 2014-11-18: 500 mL via INTRAVENOUS

## 2014-11-18 NOTE — ED Provider Notes (Addendum)
Pt seen and evaluated.  D/W PA Muthersbaugh.  Patient described sudden onset of symptoms about awakening. Last less than 1 minute. Had no associated shortness of breath, nausea, dyspnea, or other changes. No history of known heart disease. History of diabetes. States she has not truly hypertensive but is on a medicine "protect the kidneys from the diabetes.  EKG shows no changes. First troponin normal. Patient with heart score of 3. Plan second troponin and reevaluation.  On exam she has tenderness across her chest diffusely. She states the pain she has had swelling in the emergency room was with leaning against the x-ray plate". Sounds and seems reproducible.   EKG: normal EKG, normal sinus rhythm, unchanged from previous tracings.    Rolland Porter, MD 11/18/14 0800  Rolland Porter, MD 11/18/14 [phone]

## 2014-11-18 NOTE — ED Notes (Signed)
MD at bedside. 

## 2014-11-18 NOTE — Discharge Instructions (Signed)
1. Medications: usual home medications 2. Treatment: rest, drink plenty of fluids,  3. Follow Up: Please followup with your primary doctor in 2 days for discussion of your diagnoses and further evaluation after today's visit; if you do not have a primary care doctor use the resource guide provided to find one; Please return to the ER for return of chest pain, worsening symptoms or other concerns

## 2014-11-18 NOTE — ED Notes (Signed)
PA Muthersbaugh at bedside. 

## 2014-11-18 NOTE — ED Provider Notes (Signed)
CSN: [id number]     Arrival date & time 11/18/14  7829 History   First MD Initiated Contact with Patient 11/18/14 0602     Chief Complaint  Patient presents with  . Chest Pain  . Shortness of Breath     (Consider location/radiation/quality/duration/timing/severity/associated sxs/prior Treatment) The history is provided by the patient and medical records. No language interpreter was used.     Jasmine Cohen is a 69 y.o. female  with a hx of HTN, NIDDM, GERD presents to the Emergency Department complaining of sudden onset, now resolved chest pain that felt like a baseball bat to the chest with associated left arm paresthesias onset 4am. Associated symptoms include nausea and shortness of breath.  Pt reports she has bronchitis 1 month ago.  Pt reports persistence of chest tightness, but all other symptoms including pain have resolved.  Pt reports the pain was 10/10 when it happened.  She took ASA at home PTA.  No nitroglycerine given.  Nothing makes it better and nothing makes it worse.  Pt denies fever, chills, headache, neck pain, neck stiffness, abd pain, vomiting, diarrhea, weakness, dizziness, syncope.       Past Medical History  Diagnosis Date  . Hypertension   . Diabetes mellitus without complication   . GERD (gastroesophageal reflux disease)    Past Surgical History  Procedure Laterality Date  . Cholecystectomy    . Abdominal hysterectomy     Family History  Problem Relation Age of Onset  . Breast cancer Mother   . Diabetes Mellitus II Neg Hx    Social History  Substance Use Topics  . Smoking status: Never Smoker   . Smokeless tobacco: Never Used  . Alcohol Use: No   OB History    No data available     Review of Systems  Constitutional: Positive for diaphoresis. Negative for fever, appetite change, fatigue and unexpected weight change.  HENT: Negative for mouth sores.   Eyes: Negative for visual disturbance.  Respiratory: Positive for shortness of breath. Negative  for cough, chest tightness and wheezing.   Cardiovascular: Positive for chest pain.  Gastrointestinal: Positive for nausea. Negative for vomiting, abdominal pain, diarrhea and constipation.  Endocrine: Negative for polydipsia, polyphagia and polyuria.  Genitourinary: Negative for dysuria, urgency, frequency and hematuria.  Musculoskeletal: Negative for back pain and neck stiffness.  Skin: Negative for rash.  Allergic/Immunologic: Negative for immunocompromised state.  Neurological: Negative for syncope, light-headedness and headaches.  Hematological: Does not bruise/bleed easily.  Psychiatric/Behavioral: Negative for sleep disturbance. The patient is not nervous/anxious.       Allergies  Codeine; Contrast media; and Vistaril  Home Medications   Prior to Admission medications   Medication Sig Start Date End Date Taking? Authorizing Provider  aspirin EC 325 MG tablet Take 650 mg by mouth 4 (four) times daily.   Yes Historical Provider, MD  atorvastatin (LIPITOR) 40 MG tablet Take 40 mg by mouth at bedtime.  12/24/13  Yes Historical Provider, MD  lisinopril (PRINIVIL,ZESTRIL) 20 MG tablet Take 10 mg by mouth daily.   Yes Historical Provider, MD  metFORMIN (GLUCOPHAGE) 1000 MG tablet Take 500 mg by mouth 2 (two) times daily with a meal.   Yes Historical Provider, MD  ranitidine (ZANTAC) 150 MG tablet Take 300 mg by mouth every morning.  02/01/14  Yes Historical Provider, MD  saccharomyces boulardii (FLORASTOR) 250 MG capsule Take 250 mg by mouth daily.   Yes Historical Provider, MD   BP 120/61 mmHg  Pulse 72  Temp(Src) 98.3 F (36.8 C) (Oral)  Resp 16  Ht  (1.727 m)  Wt 240 lb (108.863 kg)  BMI 36.50 kg/m2  SpO2 99% Physical Exam  Constitutional: She appears well-developed and well-nourished. No distress.  Awake, alert, nontoxic appearance Obese  HENT:  Head: Normocephalic and atraumatic.  Mouth/Throat: Oropharynx is clear and moist. No oropharyngeal exudate.  Eyes:  Conjunctivae are normal. No scleral icterus.  Neck: Normal range of motion. Neck supple.  Cardiovascular: Normal rate, regular rhythm, normal heart sounds and intact distal pulses.   No murmur heard. Pulmonary/Chest: Effort normal and breath sounds normal. No respiratory distress. She has no wheezes.  Equal chest expansion  Abdominal: Soft. Bowel sounds are normal. She exhibits no mass. There is no tenderness. There is no rebound and no guarding.  Soft and nontender  Musculoskeletal: Normal range of motion. She exhibits no edema.  Neurological: She is alert.  Speech is clear and goal oriented Moves extremities without ataxia  Skin: Skin is warm and dry. No rash noted. She is not diaphoretic. No erythema.  Psychiatric: She has a normal mood and affect.  Nursing note and vitals reviewed.   ED Course  Procedures (including critical care time) Labs Review Labs Reviewed  BASIC METABOLIC PANEL - Abnormal; Notable for the following:    Potassium 3.4 (*)    Glucose, Bld 120 (*)    Creatinine, Ser 1.05 (*)    GFR calc non Af Amer 53 (*)    All other components within normal limits  CBC  BRAIN NATRIURETIC PEPTIDE  LIPASE, BLOOD  I-STAT TROPOININ, ED  Jasmine Cohen, ED    Imaging Review Dg Chest 2 View  11/18/2014   CLINICAL DATA:  Centralized chest pain tonight.  EXAM: CHEST  2 VIEW  COMPARISON:  09/01/2014  FINDINGS: The cardiomediastinal contours are normal. The lungs are clear. Pulmonary vasculature is normal. No consolidation, pleural effusion, or pneumothorax. No acute osseous abnormalities are seen. There is degenerative change in the spine.  IMPRESSION: No acute pulmonary process.   Electronically Signed   By: Rubye Oaks M.D.   On: 11/18/2014 06:47   I have personally reviewed and evaluated these images and lab results as part of my medical decision-making.   EKG Interpretation None        MDM   Final diagnoses:  Chest pain, unspecified chest pain type   Epigastric abdominal pain  Anxiety  Diabetes mellitus type 2, controlled   Jasmine Cohen presents with improved chest pain which woke her from sleep.  No cardiac hx.  Pt with hx of anxiety and she is clearly anxious during hx and physical.  Will begin cardiac work-up.  Heart Score 3 - low risk will delta trop. ECG nonischemic.    9:03 AM  Pt resting comfortably.  No CP.  Mild hypokalemia noted and repleted in the department.    9:49 AM Pt remains CP free and resting comfortably.  Abd remains soft and nontender.  Initial troponin and delta troponin negative.  Slightly elevated serum creatinine, patient given judicious fluid bolus. BNP within normal limits and chest x-ray without evidence of pneumonia or pulmonary edema.  Lipase is not elevated.  Patients symptoms are more consistent with anxiety but no evidence of ACS today.  PERC negative.    BP 120/61 mmHg  Pulse 72  Temp(Src) 98.3 F (36.8 C) (Oral)  Resp 16  Ht  (1.727 m)  Wt 240 lb (108.863 kg)  BMI 36.50 kg/m2  SpO2  99%  The patient was discussed with and seen by Dr. Fayrene Fearing who agrees with the treatment plan.   Dierdre Forth, PA-C 11/18/14 0981  April Palumbo, MD 11/18/14 [phone]

## 2014-11-18 NOTE — ED Notes (Signed)
Pt brought to ED from home by [facility] for 10/10 mid chest pain going to her left, pt describe pain as pressure when she woke up at 4 am with nausea and SOB associated. Pt got 324 mg PO Aspirin before EMS arrival. VS for [facility] 120/87, HR 70, R18, 94% on RA, CBG 116.

## 2014-11-18 NOTE — ED Notes (Signed)
Pt mild anxious after CXray, c/o CP 6/10, son at the bedside. MD to be notified.

## 2015-02-15 IMAGING — CR DG FOOT COMPLETE 3+V*L*
3 series · 3 of 3 positions shown · non-contrast
Comparison: None.

CLINICAL DATA: Fall with left foot pain.

LEFT FOOT - COMPLETE 3+ VIEW

[x foot ap left]
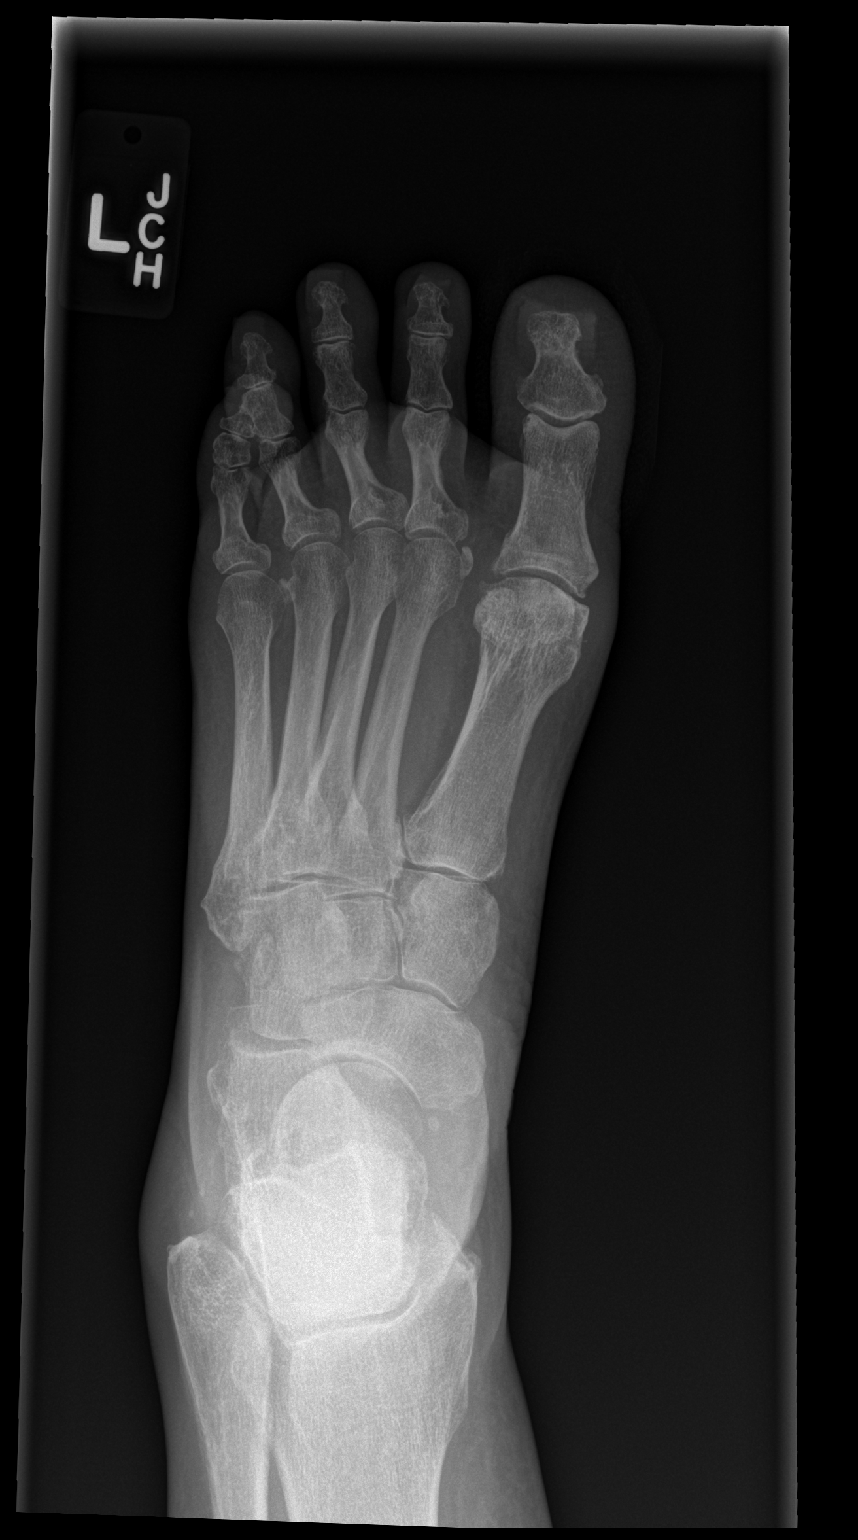

[x foot obl left]
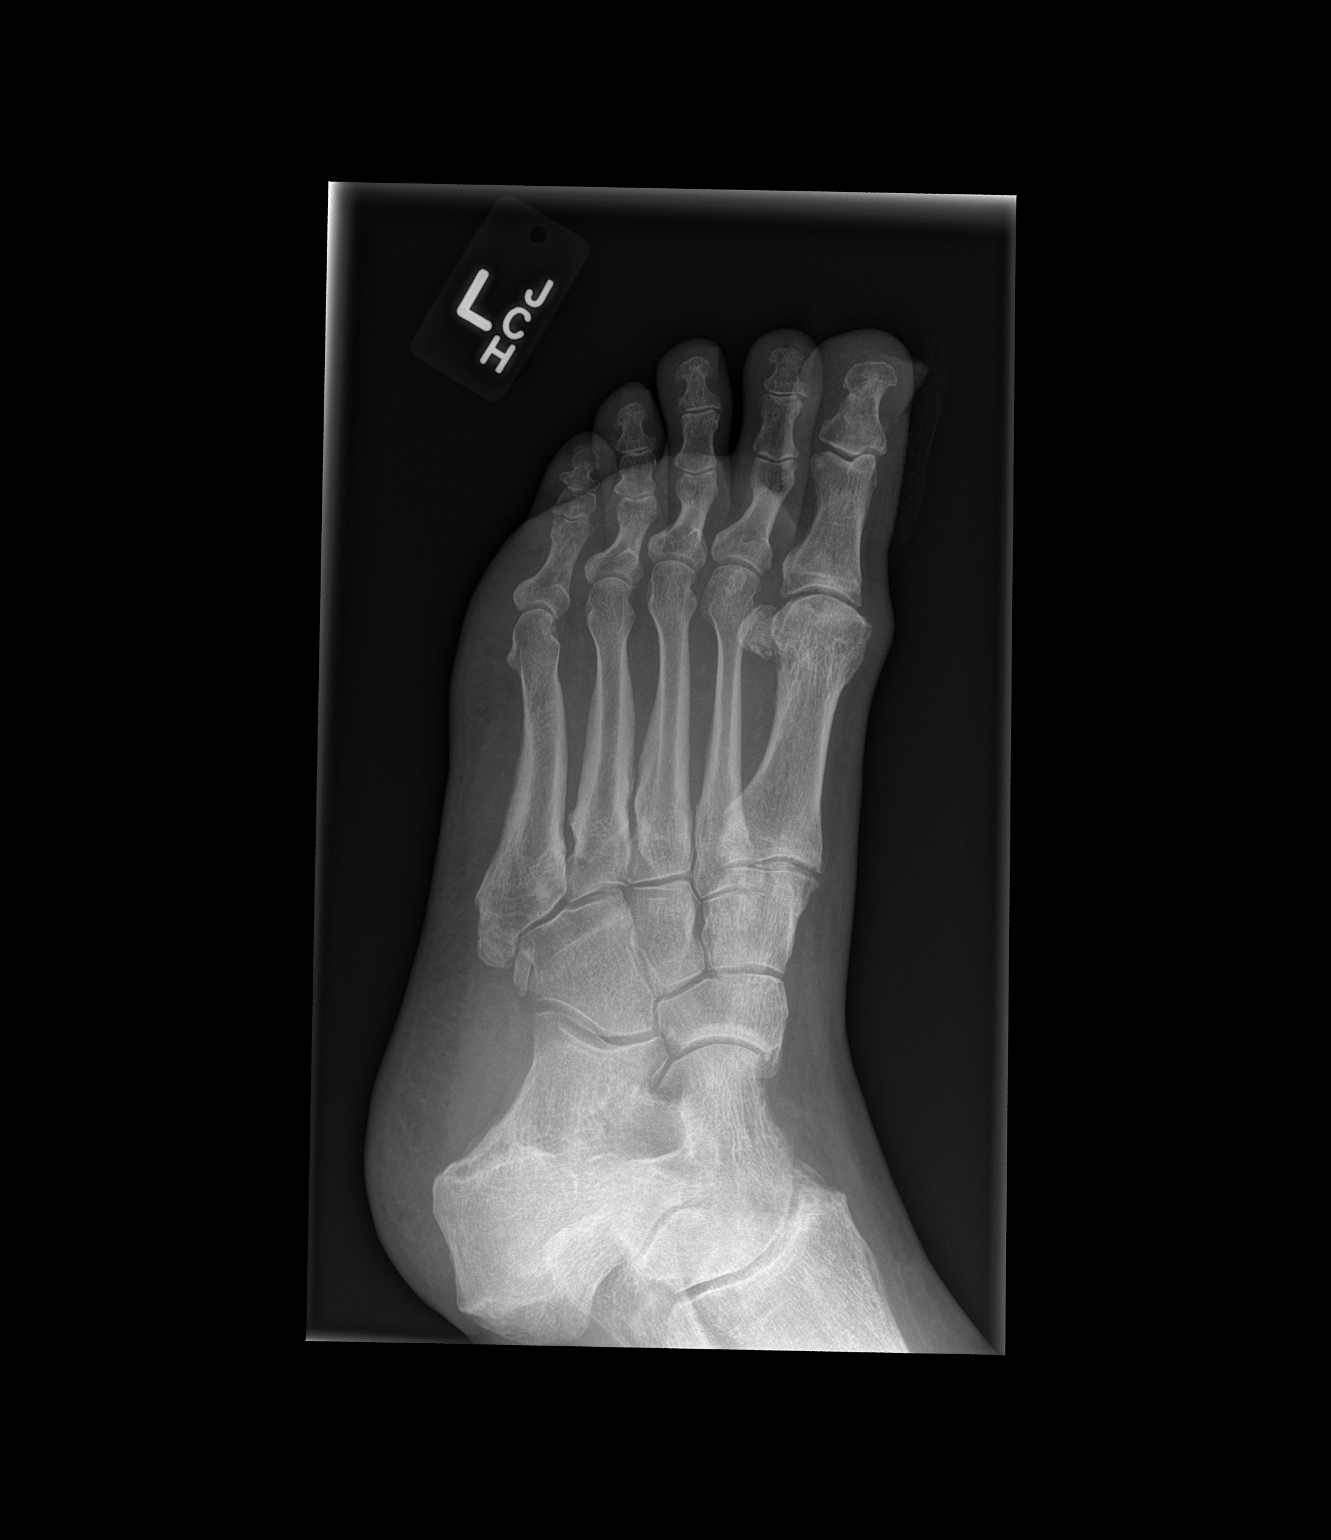

[x foot lat left]
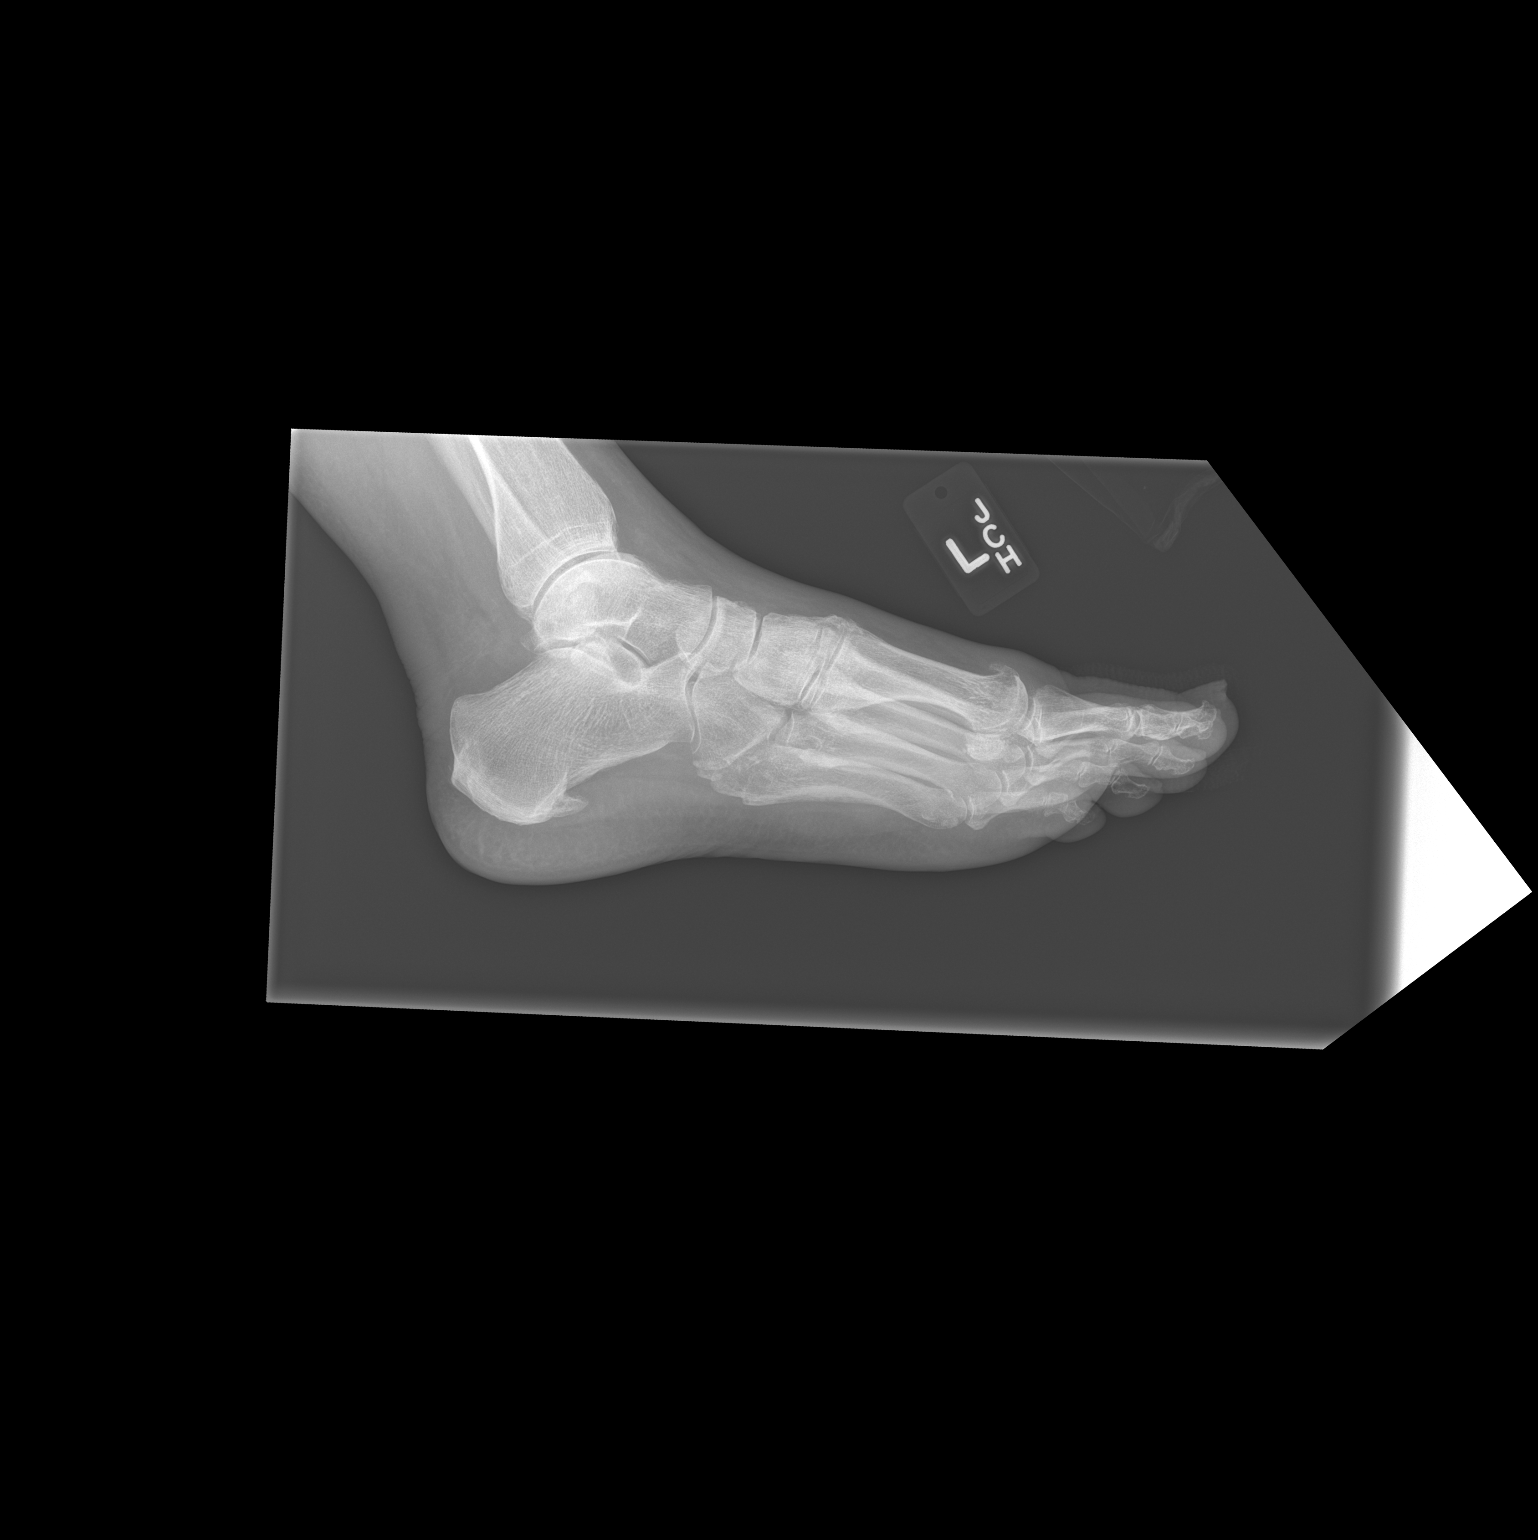

[3 of 3 positions shown; findings below may reference images not displayed]

FINDINGS: Degenerative changes are seen involving the toes and
tarsal bones.  There is no evidence of acute fracture or
dislocation.  Soft tissues are unremarkable.  No bony lesions or
erosions are identified.
IMPRESSION: No acute fracture.  Degenerative changes of the foot are present.

## 2015-02-15 IMAGING — CR DG ANKLE COMPLETE 3+V*L*
3 series · 3 of 3 positions shown · non-contrast
Comparison: None.

CLINICAL DATA: Fall with left ankle pain.

LEFT ANKLE COMPLETE - 3+ VIEW

[x ankle ap left]
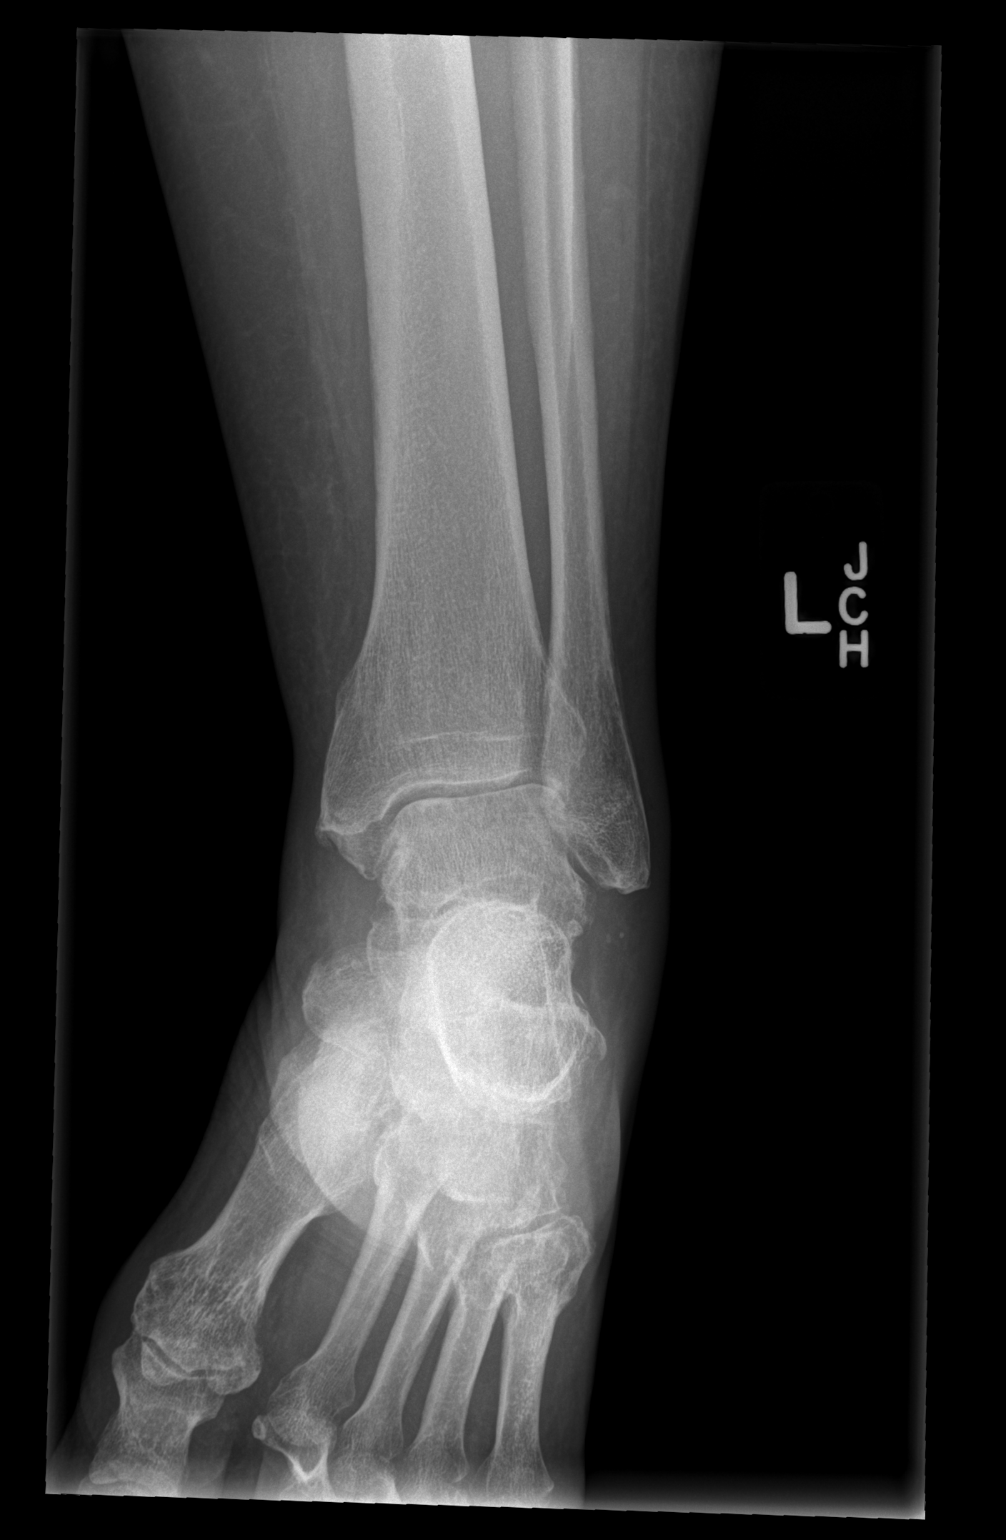

[x ankle obl left]
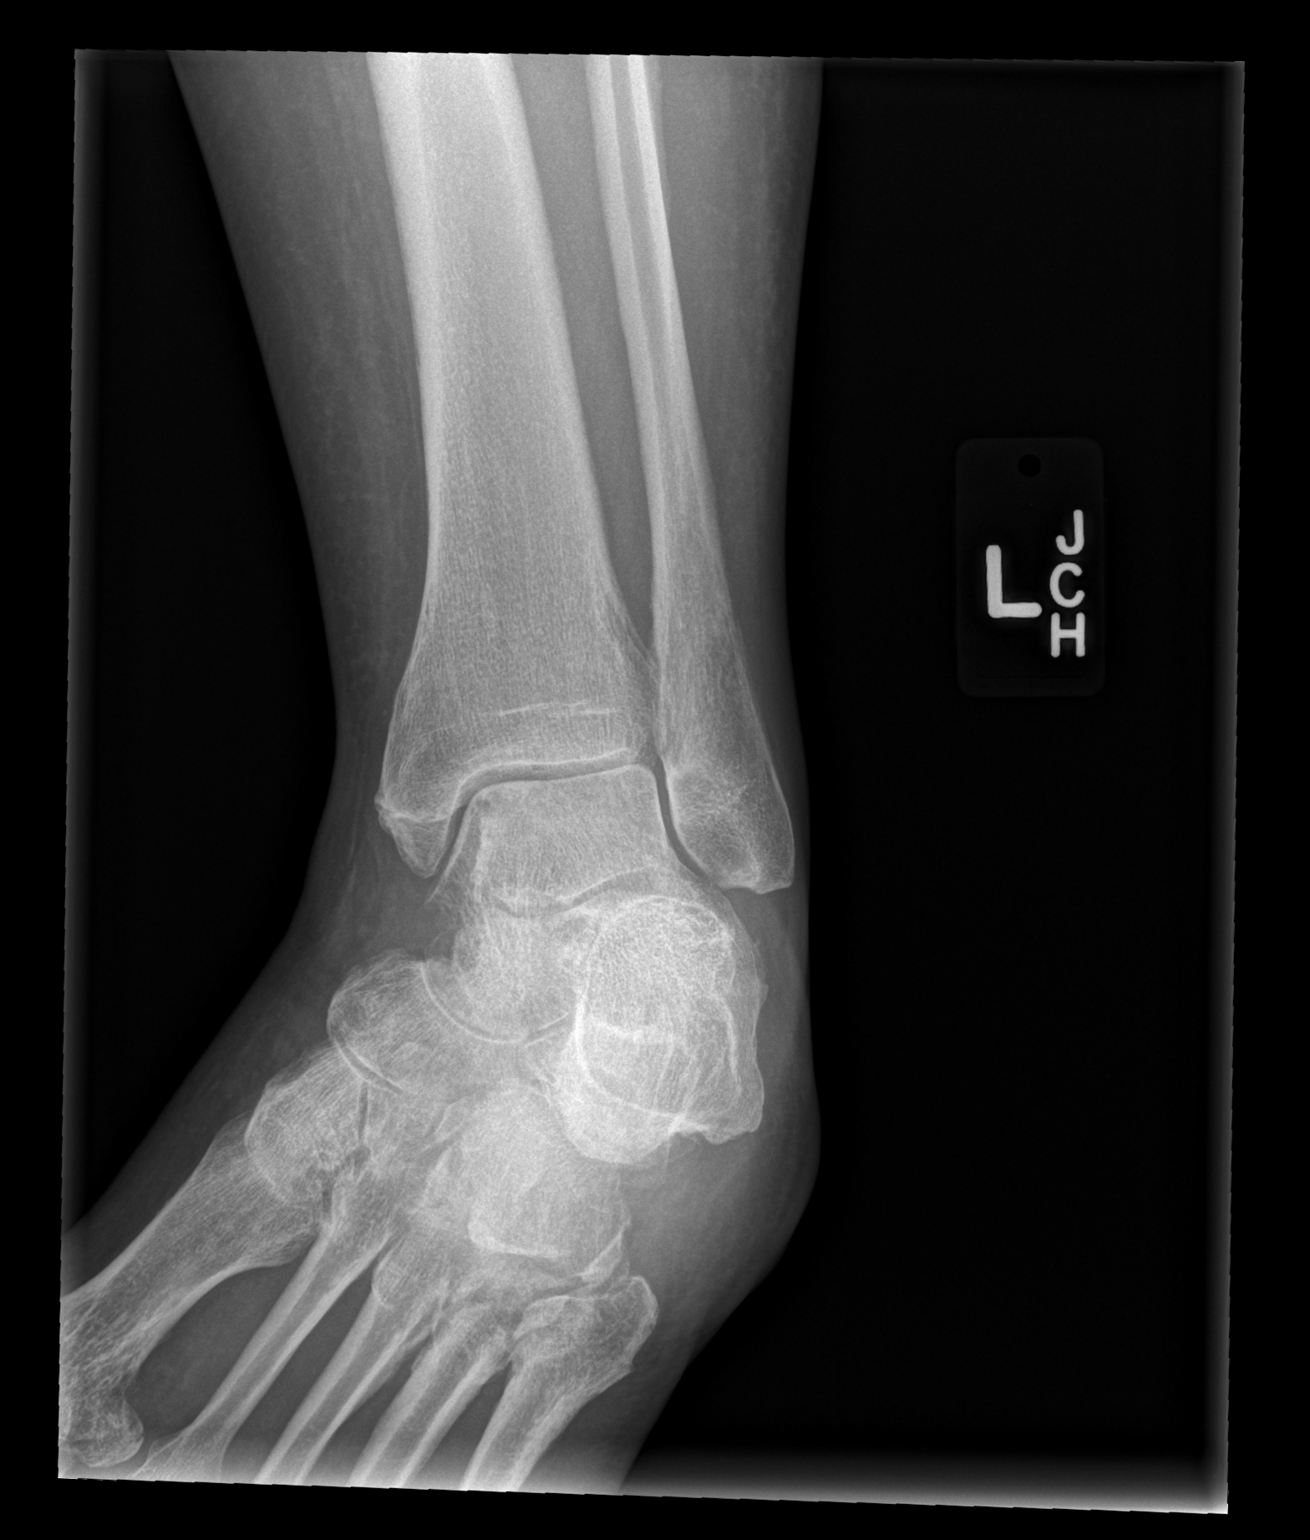

[x ankle lat left]
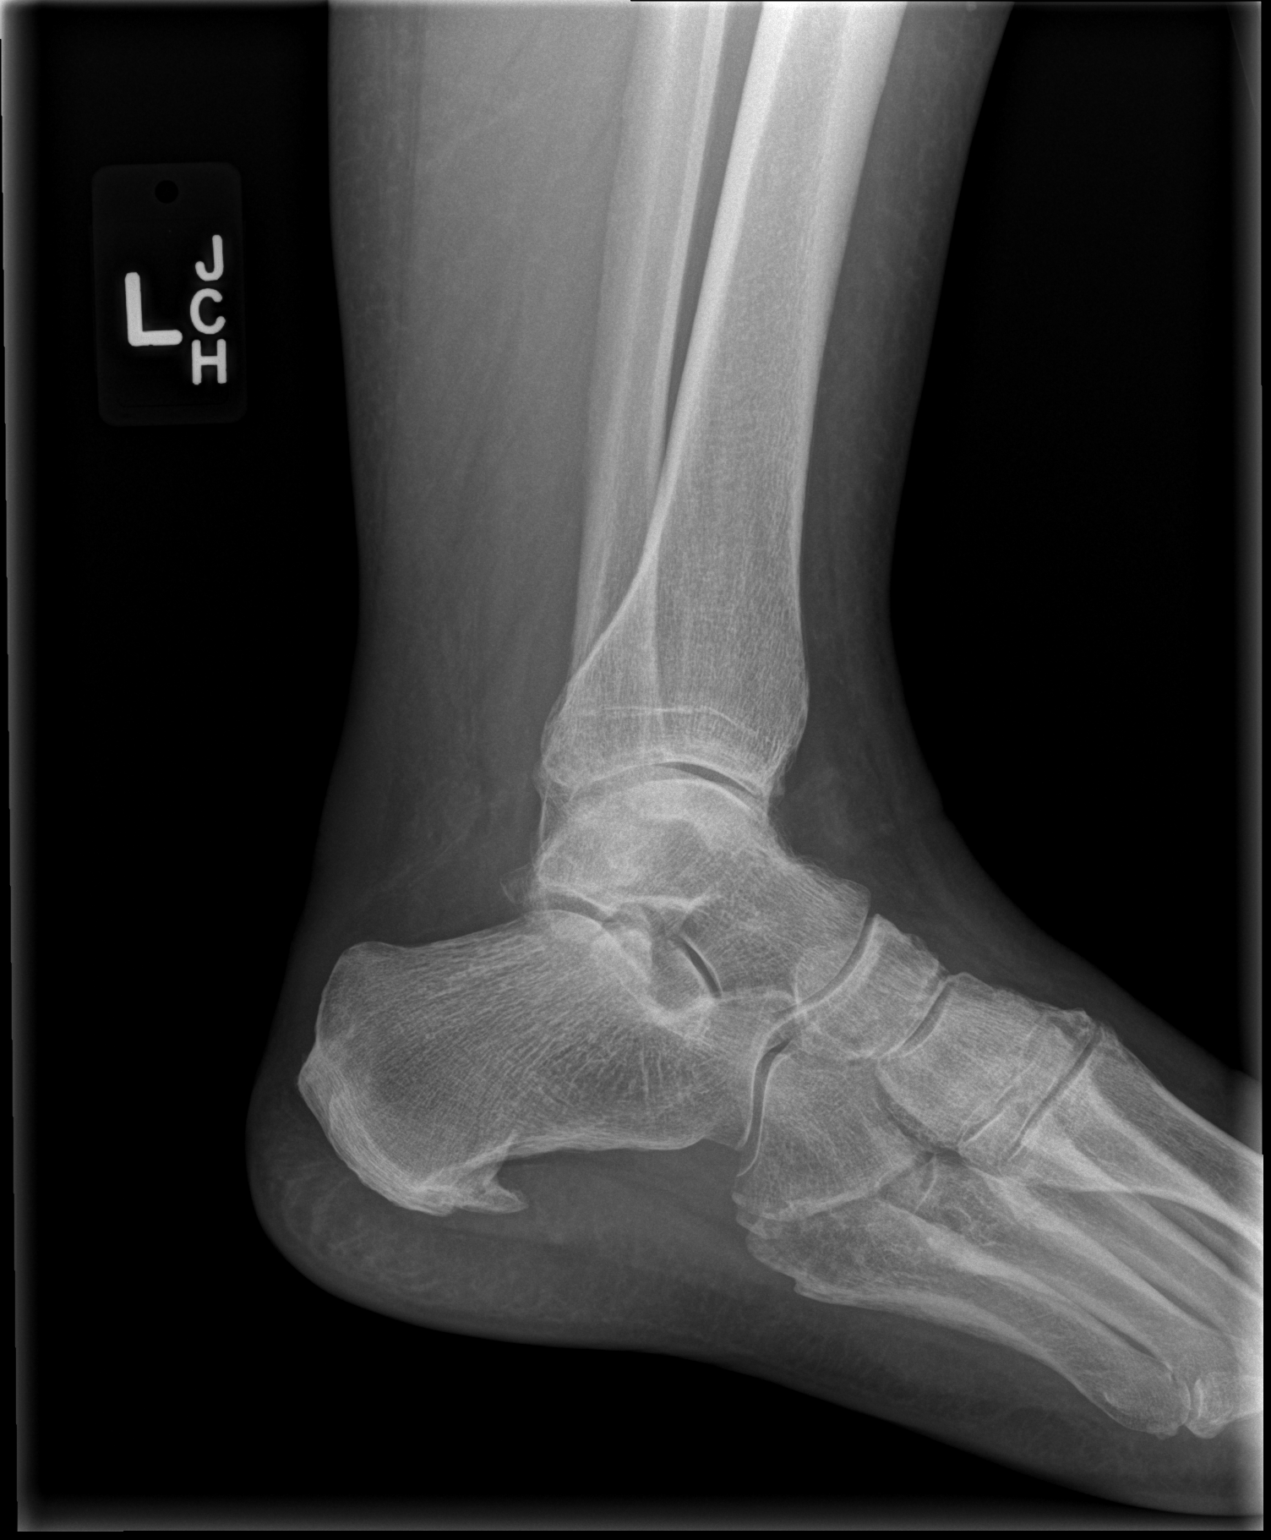

[3 of 3 positions shown; findings below may reference images not displayed]

FINDINGS: Moderate degenerative changes are seen involving the
ankle mortise.  There is no evidence of acute fracture or
dislocation.  Soft tissues are unremarkable.  No bony lesions are
identified.
IMPRESSION: Moderate degenerative changes of the left ankle.  No acute fracture
is identified.

## 2016-09-03 ENCOUNTER — Encounter (HOSPITAL_COMMUNITY): Payer: Self-pay | Admitting: Nurse Practitioner

## 2016-09-03 ENCOUNTER — Emergency Department (HOSPITAL_COMMUNITY)
Admission: EM | Admit: 2016-09-03 | Discharge: 2016-09-03 | Disposition: A | Discharge status: Home / Self Care | Payer: Medicare Other | Attending: Emergency Medicine | Admitting: Emergency Medicine

## 2016-09-03 ENCOUNTER — Emergency Department (HOSPITAL_COMMUNITY): Payer: Medicare Other

## 2016-09-03 DIAGNOSIS — W010XXA Fall on same level from slipping, tripping and stumbling without subsequent striking against object, initial encounter: Secondary | ICD-10-CM | POA: Diagnosis not present

## 2016-09-03 DIAGNOSIS — Y92009 Unspecified place in unspecified non-institutional (private) residence as the place of occurrence of the external cause: Secondary | ICD-10-CM | POA: Diagnosis not present

## 2016-09-03 DIAGNOSIS — S60222A Contusion of left hand, initial encounter: Secondary | ICD-10-CM | POA: Diagnosis not present

## 2016-09-03 DIAGNOSIS — I10 Essential (primary) hypertension: Secondary | ICD-10-CM | POA: Insufficient documentation

## 2016-09-03 DIAGNOSIS — E119 Type 2 diabetes mellitus without complications: Secondary | ICD-10-CM | POA: Insufficient documentation

## 2016-09-03 DIAGNOSIS — Z79899 Other long term (current) drug therapy: Secondary | ICD-10-CM | POA: Insufficient documentation

## 2016-09-03 DIAGNOSIS — S8011XA Contusion of right lower leg, initial encounter: Secondary | ICD-10-CM | POA: Insufficient documentation

## 2016-09-03 DIAGNOSIS — S8001XA Contusion of right knee, initial encounter: Secondary | ICD-10-CM

## 2016-09-03 DIAGNOSIS — Z7982 Long term (current) use of aspirin: Secondary | ICD-10-CM | POA: Insufficient documentation

## 2016-09-03 DIAGNOSIS — Y9301 Activity, walking, marching and hiking: Secondary | ICD-10-CM | POA: Insufficient documentation

## 2016-09-03 DIAGNOSIS — S0083XA Contusion of other part of head, initial encounter: Secondary | ICD-10-CM | POA: Diagnosis not present

## 2016-09-03 DIAGNOSIS — Z7984 Long term (current) use of oral hypoglycemic drugs: Secondary | ICD-10-CM | POA: Insufficient documentation

## 2016-09-03 DIAGNOSIS — Y999 Unspecified external cause status: Secondary | ICD-10-CM | POA: Insufficient documentation

## 2016-09-03 DIAGNOSIS — S0093XA Contusion of unspecified part of head, initial encounter: Secondary | ICD-10-CM

## 2016-09-03 DIAGNOSIS — S0990XA Unspecified injury of head, initial encounter: Secondary | ICD-10-CM | POA: Diagnosis present

## 2016-09-03 MED ORDER — BACITRACIN ZINC 500 UNIT/GM EX OINT
TOPICAL_OINTMENT | CUTANEOUS | Status: AC
  Administered 2016-09-03: 1
  Filled 2016-09-03: qty 0.9

## 2016-09-03 MED ORDER — ACETAMINOPHEN 325 MG PO TABS
650.0000 mg | ORAL_TABLET | Freq: Once | ORAL | Status: AC
  Administered 2016-09-03: 650 mg via ORAL
  Filled 2016-09-03: qty 2

## 2016-09-03 NOTE — Discharge Instructions (Signed)
Use Tylenol as needed for pain. Use a knee sleeve as needed for comfort.

## 2016-09-03 NOTE — ED Provider Notes (Signed)
[facility]-EMERGENCY DEPT Provider Note   CSN: [id number] Arrival date & time: 09/03/16  1351     History   Chief Complaint No chief complaint on file.   HPI Jasmine Cohen is a 71 y.o. female.  71 y/o female here after a mechnical fall at home No loc or headache since fall C/o pain to left hand and right knee described and dull and worse with movement or palpation Denies neck pain, chest discomfort, back pain, or abd pain Pt denies use of blood thinners Ems called and pt transported here      Past Medical History:  Diagnosis Date  . Diabetes mellitus without complication (HCC)   . GERD (gastroesophageal reflux disease)   . Hypertension     Patient Active Problem List   Diagnosis Date Noted  . Abdominal pain 03/27/2014  . Leucocytosis 03/27/2014  . Diabetes mellitus type 2, controlled (HCC) 03/27/2014  . Hyperlipidemia 03/27/2014  . Fall 11/27/2012  . Avulsed toenail 11/27/2012  . Contusion of left knee 11/27/2012    Past Surgical History:  Procedure Laterality Date  . ABDOMINAL HYSTERECTOMY    . CHOLECYSTECTOMY      OB History    No data available       Home Medications    Prior to Admission medications   Medication Sig Start Date End Date Taking? Authorizing Provider  aspirin EC 325 MG tablet Take 650 mg by mouth 4 (four) times daily.   Yes [provider]  atorvastatin (LIPITOR) 40 MG tablet Take 40 mg by mouth at bedtime.  12/24/13  Yes [provider]  lisinopril (PRINIVIL,ZESTRIL) 20 MG tablet Take 10 mg by mouth daily.   Yes [provider]  metFORMIN (GLUCOPHAGE) 1000 MG tablet Take 500 mg by mouth 2 (two) times daily with a meal.   Yes [provider]  ranitidine (ZANTAC) 150 MG tablet Take 300 mg by mouth every morning.  02/01/14  Yes [provider]  saccharomyces boulardii (FLORASTOR) 250 MG capsule Take 250 mg by mouth daily.   Yes [provider]    Family History Family History  Problem  Relation Age of Onset  . Breast cancer Mother   . Diabetes Mellitus II Neg Hx     Social History Social History  Substance Use Topics  . Smoking status: Never Smoker  . Smokeless tobacco: Never Used  . Alcohol use No     Allergies   Codeine; Contrast media [iodinated diagnostic agents]; and Vistaril [hydroxyzine]   Review of Systems Review of Systems  All other systems reviewed and are negative.    Physical Exam Updated Vital Signs BP 138/84   Pulse 82   Resp 18   Ht 1.727 m (5\' 8" )   Wt 108.9 kg (240 lb)   SpO2 99%   BMI 36.49 kg/m   Physical Exam  Constitutional: She is oriented to person, place, and time. She appears well-developed and well-nourished.  Non-toxic appearance. No distress.  HENT:  Head: Normocephalic and atraumatic.    Eyes: Conjunctivae, EOM and lids are normal. Pupils are equal, round, and reactive to light.  Neck: Normal range of motion. Neck supple. No spinous process tenderness and no muscular tenderness present. No tracheal deviation present. No thyroid mass present.  Cardiovascular: Normal rate, regular rhythm and normal heart sounds.  Exam reveals no gallop.   No murmur heard. Pulmonary/Chest: Effort normal and breath sounds normal. No stridor. No respiratory distress. She has no decreased breath sounds. She has no  wheezes. She has no rhonchi. She has no rales.  Abdominal: Soft. Normal appearance and bowel sounds are normal. She exhibits no distension. There is no tenderness. There is no rebound and no CVA tenderness.  Musculoskeletal: She exhibits no edema.       Right knee: She exhibits decreased range of motion and swelling. She exhibits no effusion and no ecchymosis. Tenderness found.       Hands: Pt able to open and close hand w/o difficulty Radial pulse 2 plus No brusining to skin  Neurological: She is alert and oriented to person, place, and time. She has normal strength. No cranial nerve deficit or sensory deficit. GCS eye subscore  is 4. GCS verbal subscore is 5. GCS motor subscore is 6.  Skin: Skin is warm and dry. No abrasion and no rash noted.  Psychiatric: She has a normal mood and affect. Her speech is normal and behavior is normal.  Nursing note and vitals reviewed.    ED Treatments / Results  Labs (all labs ordered are listed, but only abnormal results are displayed) Labs Reviewed - No data to display  EKG  EKG Interpretation None       Radiology No results found.  Procedures Procedures (including critical care time)  Medications Ordered in ED Medications  acetaminophen (TYLENOL) tablet 650 mg (not administered)     Initial Impression / Assessment and Plan / ED Course  I have reviewed the triage vital signs and the nursing notes.  Pertinent labs & imaging results that were available during my care of the patient were reviewed by me and considered in my medical decision making (see chart for details).     Patient given Tylenol here. Feels better. X-rays of her tib-fib knee and left hand were all negative. Patient alert and oriented 4 here. Has not been vomiting. Has no headache. No indication for acute intracranial imaging. Will be discharged home with return precautions.  Final Clinical Impressions(s) / ED Diagnoses   Final diagnoses:  None    New Prescriptions New Prescriptions   No medications on file     Lorre Nick, MD 09/03/16 1542

## 2016-09-03 NOTE — ED Triage Notes (Signed)
Patient was walking up the ramp to her house and tripped and fell hit her head. NO LOC. Right knee pain. Patient not on blood thinners.

## 2016-09-03 NOTE — ED Notes (Signed)
Ice placed on patient's right knee.

## 2016-09-03 NOTE — ED Notes (Signed)
[facility] Expected date:  Expected time:  Means of arrival:  Comments: EMS , fall, leg pain

## 2016-09-03 NOTE — ED Notes (Signed)
Ortho tech at bedside 

## 2016-09-03 NOTE — ED Notes (Signed)
Pt ambulatory and independent at discharge.  Verbalized understanding of discharge instructions 

## 2017-04-28 ENCOUNTER — Ambulatory Visit
Admission: RE | Admit: 2017-04-28 | Discharge: 2017-04-28 | Disposition: A | Discharge status: Home / Self Care | Payer: Medicare Other | Source: Ambulatory Visit | Attending: Nurse Practitioner | Admitting: Nurse Practitioner

## 2017-04-28 ENCOUNTER — Other Ambulatory Visit: Payer: Self-pay | Admitting: Nurse Practitioner

## 2017-04-28 DIAGNOSIS — R52 Pain, unspecified: Secondary | ICD-10-CM

## 2017-08-16 ENCOUNTER — Ambulatory Visit
Admission: RE | Admit: 2017-08-16 | Discharge: 2017-08-16 | Disposition: A | Discharge status: Home / Self Care | Payer: Medicare Other | Source: Ambulatory Visit | Attending: Family Medicine | Admitting: Family Medicine

## 2017-08-16 ENCOUNTER — Other Ambulatory Visit: Payer: Self-pay | Admitting: Family Medicine

## 2017-08-16 DIAGNOSIS — R0602 Shortness of breath: Secondary | ICD-10-CM

## 2017-08-23 ENCOUNTER — Other Ambulatory Visit: Payer: Self-pay | Admitting: Gastroenterology

## 2017-08-23 DIAGNOSIS — R1032 Left lower quadrant pain: Secondary | ICD-10-CM

## 2017-09-02 ENCOUNTER — Ambulatory Visit
Admission: RE | Admit: 2017-09-02 | Discharge: 2017-09-02 | Disposition: A | Discharge status: Home / Self Care | Payer: Medicare Other | Source: Ambulatory Visit | Attending: Gastroenterology | Admitting: Gastroenterology

## 2017-09-02 ENCOUNTER — Other Ambulatory Visit: Payer: Self-pay | Admitting: Gastroenterology

## 2017-09-02 DIAGNOSIS — R1032 Left lower quadrant pain: Secondary | ICD-10-CM

## 2017-10-11 ENCOUNTER — Ambulatory Visit
Admission: RE | Admit: 2017-10-11 | Discharge: 2017-10-11 | Disposition: A | Discharge status: Home / Self Care | Payer: Medicare Other | Source: Ambulatory Visit | Attending: Nurse Practitioner | Admitting: Nurse Practitioner

## 2017-10-11 ENCOUNTER — Other Ambulatory Visit: Payer: Self-pay | Admitting: Nurse Practitioner

## 2017-10-11 DIAGNOSIS — M542 Cervicalgia: Secondary | ICD-10-CM

## 2018-03-31 ENCOUNTER — Emergency Department (HOSPITAL_COMMUNITY)
Admission: EM | Admit: 2018-03-31 | Discharge: 2018-03-31 | Disposition: A | Discharge status: Home / Self Care | Payer: Medicare Other | Attending: Emergency Medicine | Admitting: Emergency Medicine

## 2018-03-31 ENCOUNTER — Encounter (HOSPITAL_COMMUNITY): Payer: Self-pay

## 2018-03-31 ENCOUNTER — Other Ambulatory Visit: Payer: Self-pay

## 2018-03-31 ENCOUNTER — Emergency Department (HOSPITAL_COMMUNITY): Payer: Medicare Other

## 2018-03-31 DIAGNOSIS — E119 Type 2 diabetes mellitus without complications: Secondary | ICD-10-CM | POA: Diagnosis not present

## 2018-03-31 DIAGNOSIS — Z79899 Other long term (current) drug therapy: Secondary | ICD-10-CM | POA: Insufficient documentation

## 2018-03-31 DIAGNOSIS — I10 Essential (primary) hypertension: Secondary | ICD-10-CM | POA: Insufficient documentation

## 2018-03-31 DIAGNOSIS — M7918 Myalgia, other site: Secondary | ICD-10-CM

## 2018-03-31 DIAGNOSIS — R52 Pain, unspecified: Secondary | ICD-10-CM | POA: Insufficient documentation

## 2018-03-31 LAB — CBC
HCT: 43.8 % (ref 36.0–46.0)
HEMOGLOBIN: 14.1 g/dL (ref 12.0–15.0)
MCH: 29.5 pg (ref 26.0–34.0)
MCHC: 32.2 g/dL (ref 30.0–36.0)
MCV: 91.6 fL (ref 80.0–100.0)
NRBC: 0 % (ref 0.0–0.2)
Platelets: 365 10*3/uL (ref 150–400)
RBC: 4.78 MIL/uL (ref 3.87–5.11)
RDW: 13.7 % (ref 11.5–15.5)
WBC: 11 10*3/uL — ABNORMAL HIGH (ref 4.0–10.5)

## 2018-03-31 LAB — URINALYSIS, ROUTINE W REFLEX MICROSCOPIC
Bilirubin Urine: NEGATIVE
GLUCOSE, UA: NEGATIVE mg/dL
Hgb urine dipstick: NEGATIVE
Ketones, ur: NEGATIVE mg/dL
LEUKOCYTES UA: NEGATIVE
Nitrite: NEGATIVE
PH: 9 — AB (ref 5.0–8.0)
PROTEIN: NEGATIVE mg/dL
Specific Gravity, Urine: 1.006 (ref 1.005–1.030)

## 2018-03-31 LAB — BASIC METABOLIC PANEL
ANION GAP: 12 (ref 5–15)
BUN: 16 mg/dL (ref 8–23)
CO2: 25 mmol/L (ref 22–32)
Calcium: 9.2 mg/dL (ref 8.9–10.3)
Chloride: 104 mmol/L (ref 98–111)
Creatinine, Ser: 0.89 mg/dL (ref 0.44–1.00)
GFR calc Af Amer: >60 mL/min (ref >60)
Glucose, Bld: 141 mg/dL — ABNORMAL HIGH (ref 70–99)
POTASSIUM: 4.1 mmol/L (ref 3.5–5.1)
Sodium: 141 mmol/L (ref 135–145)

## 2018-03-31 LAB — CBG MONITORING, ED: GLUCOSE-CAPILLARY: 107 mg/dL — AB (ref 70–99)

## 2018-03-31 MED ORDER — FENTANYL CITRATE (PF) 100 MCG/2ML IJ SOLN
50.0000 ug | Freq: Once | INTRAMUSCULAR | Status: AC
Start: 2018-03-31 — End: 2018-03-31
  Administered 2018-03-31: 50 ug via INTRAVENOUS
  Filled 2018-03-31: qty 2

## 2018-03-31 NOTE — ED Notes (Signed)
Patient ambulated to restroom with 1 person assistance due to pain.

## 2018-03-31 NOTE — ED Triage Notes (Signed)
Patient comes from home. Patient is AOx4 and ambulatory. Patient arrived via [facility]. Patient called [phone] due to having left flank pain and left back pain that began at 4am this morning. Patient recently had colonoscopy about 15 days ago. Pain is 10/10. Patient has no other complaints.

## 2018-03-31 NOTE — ED Notes (Addendum)
MD made aware patient is requesting additional pain medicine.

## 2018-03-31 NOTE — Discharge Instructions (Addendum)
Test showed no life-threatening condition.  Sent on ice pack at home.  Take your normal pain medication 2-3 times per day.

## 2018-03-31 NOTE — ED Notes (Addendum)
Patient ambulated to restroom with minimal assistance. Did not get urine sample, as there was not an order and patient was in a lot of pain. Will attempt again.

## 2018-03-31 NOTE — ED Notes (Signed)
ED Provider at bedside. 

## 2018-03-31 NOTE — ED Notes (Signed)
Bed: [facility] Expected date:  Expected time:  Means of arrival:  Comments: Abdominal pain s/p colonoscopy

## 2018-03-31 NOTE — ED Notes (Signed)
Urine culture has been sent to lab with urine.

## 2018-04-01 NOTE — ED Provider Notes (Signed)
[facility]-EMERGENCY DEPT Provider Note   CSN: [id number] Arrival date & time: 03/31/18  0734     History   Chief Complaint Chief Complaint  Patient presents with  . Flank Pain    HPI Jasmine Cohen is a 73 y.o. female.  Pain over the left buttock area since 4 AM today.  No flank pain, hematuria, dysuria, fever, chills trauma, radiation down the left leg.  No evidence of pathology over the flank.  Severity is moderate.  Movement makes pain worse.     Past Medical History:  Diagnosis Date  . Diabetes mellitus without complication (HCC)   . GERD (gastroesophageal reflux disease)   . Hypertension     Patient Active Problem List   Diagnosis Date Noted  . Abdominal pain 03/27/2014  . Leucocytosis 03/27/2014  . Diabetes mellitus type 2, controlled (HCC) 03/27/2014  . Hyperlipidemia 03/27/2014  . Fall 11/27/2012  . Avulsed toenail 11/27/2012  . Contusion of left knee 11/27/2012    Past Surgical History:  Procedure Laterality Date  . ABDOMINAL HYSTERECTOMY    . CHOLECYSTECTOMY       OB History   No obstetric history on file.      Home Medications    Prior to Admission medications   Medication Sig Start Date End Date Taking? Authorizing Provider  acetaminophen (TYLENOL) 650 MG CR tablet Take 650 mg by mouth daily.   Yes [provider]  atorvastatin (LIPITOR) 40 MG tablet Take 40 mg by mouth at bedtime.  12/24/13  Yes [provider]  lisinopril (PRINIVIL,ZESTRIL) 20 MG tablet Take 20 mg by mouth daily.    Yes [provider]  metFORMIN (GLUCOPHAGE) 1000 MG tablet Take 500 mg by mouth 2 (two) times daily with a meal.   Yes [provider]  saccharomyces boulardii (FLORASTOR) 250 MG capsule Take 250 mg by mouth daily.   Yes [provider]    Family History Family History  Problem Relation Age of Onset  . Breast cancer Mother   . Diabetes Mellitus II Neg Hx     Social History Social History    Tobacco Use  . Smoking status: Never Smoker  . Smokeless tobacco: Never Used  Substance Use Topics  . Alcohol use: No  . Drug use: No     Allergies   Codeine; Contrast media [iodinated diagnostic agents]; and Vistaril [hydroxyzine]   Review of Systems Review of Systems  All other systems reviewed and are negative.    Physical Exam Updated Vital Signs BP (!) 145/70   Pulse 70   Temp 97.6 F (36.4 C) (Oral)   Resp 20   Ht 5\' 8"  (1.727 m)   Wt 113.4 kg   SpO2 98%   BMI 38.01 kg/m   Physical Exam Vitals signs and nursing note reviewed.  Constitutional:      Appearance: She is well-developed.  HENT:     Head: Normocephalic and atraumatic.  Eyes:     Conjunctiva/sclera: Conjunctivae normal.  Neck:     Musculoskeletal: Neck supple.  Cardiovascular:     Rate and Rhythm: Normal rate and regular rhythm.  Pulmonary:     Effort: Pulmonary effort is normal.     Breath sounds: Normal breath sounds.  Abdominal:     General: Bowel sounds are normal.     Palpations: Abdomen is soft.  Musculoskeletal:     Comments: Left buttocks: Tender over the mid gluteus maximus on the left.  No lower back tenderness.  No flank tenderness.  No radicular pathology.  Skin:    General: Skin is warm and dry.  Neurological:     Mental Status: She is alert and oriented to person, place, and time.  Psychiatric:        Behavior: Behavior normal.      ED Treatments / Results  Labs (all labs ordered are listed, but only abnormal results are displayed) Labs Reviewed  URINALYSIS, ROUTINE W REFLEX MICROSCOPIC - Abnormal; Notable for the following components:      Result Value   Color, Urine STRAW (*)    pH 9.0 (*)    All other components within normal limits  BASIC METABOLIC PANEL - Abnormal; Notable for the following components:   Glucose, Bld 141 (*)    All other components within normal limits  CBC - Abnormal; Notable for the following components:   WBC 11.0 (*)    All other  components within normal limits  CBG MONITORING, ED - Abnormal; Notable for the following components:   Glucose-Capillary 107 (*)    All other components within normal limits    EKG None  Radiology Dg Hip Unilat W Or Wo Pelvis 2-3 Views Left  Result Date: 03/31/2018 CLINICAL DATA:  Left flank pain radiating into the left hip and groin for 2 days. EXAM: DG HIP (WITH OR WITHOUT PELVIS) 2-3V LEFT COMPARISON:  Pelvic CT 09/02/2017. FINDINGS: The mineralization and alignment are normal. There is no evidence of acute fracture or dislocation. There is no evidence of femoral head avascular necrosis. The hip joint spaces are preserved. Mild osteitis pubis. Bilateral pelvic calcifications, likely phleboliths. IMPRESSION: Negative left hip radiographs.  Mild osteitis pubis. Electronically Signed   By: Carey Bullocks M.D.   On: 03/31/2018 09:51    Procedures Procedures (including critical care time)  Medications Ordered in ED Medications  fentaNYL (SUBLIMAZE) injection 50 mcg (50 mcg Intravenous Given 03/31/18 0918)  fentaNYL (SUBLIMAZE) injection 50 mcg (50 mcg Intravenous Given 03/31/18 1224)     Initial Impression / Assessment and Plan / ED Course  I have reviewed the triage vital signs and the nursing notes.  Pertinent labs & imaging results that were available during my care of the patient were reviewed by me and considered in my medical decision making (see chart for details).     Patient presents with a predominantly buttock pain.  Uncertain etiology.  No flank pain.  Urinalysis negative.  Basic labs acceptable.  Plain films of left hip negative.  Discussed findings with the patient and her granddaughter in law.  Pain was well controlled at discharge.  Final Clinical Impressions(s) / ED Diagnoses   Final diagnoses:  Left buttock pain    ED Discharge Orders    None       Donnetta Hutching, MD 04/01/18 [phone]

## 2019-02-19 ENCOUNTER — Ambulatory Visit
Admission: RE | Admit: 2019-02-19 | Discharge: 2019-02-19 | Disposition: A | Discharge status: Home / Self Care | Payer: Medicare Other | Source: Ambulatory Visit | Attending: Family Medicine | Admitting: Family Medicine

## 2019-02-19 ENCOUNTER — Other Ambulatory Visit: Payer: Self-pay | Admitting: Family Medicine

## 2019-02-19 ENCOUNTER — Other Ambulatory Visit: Payer: Self-pay

## 2019-02-19 DIAGNOSIS — R609 Edema, unspecified: Secondary | ICD-10-CM

## 2019-03-13 ENCOUNTER — Emergency Department (HOSPITAL_COMMUNITY): Payer: Medicare Other

## 2019-03-13 ENCOUNTER — Other Ambulatory Visit: Payer: Self-pay

## 2019-03-13 ENCOUNTER — Inpatient Hospital Stay (HOSPITAL_COMMUNITY)
Admission: EM | Admit: 2019-03-13 | Discharge: 2019-03-17 | DRG: 871 | Disposition: A | Discharge status: Home Health | Payer: Medicare Other | Attending: Internal Medicine | Admitting: Internal Medicine

## 2019-03-13 DIAGNOSIS — H9190 Unspecified hearing loss, unspecified ear: Secondary | ICD-10-CM | POA: Diagnosis present

## 2019-03-13 DIAGNOSIS — E876 Hypokalemia: Secondary | ICD-10-CM | POA: Diagnosis not present

## 2019-03-13 DIAGNOSIS — Z20828 Contact with and (suspected) exposure to other viral communicable diseases: Secondary | ICD-10-CM | POA: Diagnosis present

## 2019-03-13 DIAGNOSIS — Z7984 Long term (current) use of oral hypoglycemic drugs: Secondary | ICD-10-CM

## 2019-03-13 DIAGNOSIS — R55 Syncope and collapse: Secondary | ICD-10-CM | POA: Diagnosis present

## 2019-03-13 DIAGNOSIS — I10 Essential (primary) hypertension: Secondary | ICD-10-CM

## 2019-03-13 DIAGNOSIS — Z885 Allergy status to narcotic agent status: Secondary | ICD-10-CM

## 2019-03-13 DIAGNOSIS — A419 Sepsis, unspecified organism: Principal | ICD-10-CM | POA: Diagnosis present

## 2019-03-13 DIAGNOSIS — Z91041 Radiographic dye allergy status: Secondary | ICD-10-CM

## 2019-03-13 DIAGNOSIS — R4182 Altered mental status, unspecified: Secondary | ICD-10-CM

## 2019-03-13 DIAGNOSIS — Z90710 Acquired absence of both cervix and uterus: Secondary | ICD-10-CM

## 2019-03-13 DIAGNOSIS — E119 Type 2 diabetes mellitus without complications: Secondary | ICD-10-CM | POA: Diagnosis not present

## 2019-03-13 DIAGNOSIS — R6510 Systemic inflammatory response syndrome (SIRS) of non-infectious origin without acute organ dysfunction: Secondary | ICD-10-CM

## 2019-03-13 DIAGNOSIS — N179 Acute kidney failure, unspecified: Secondary | ICD-10-CM

## 2019-03-13 DIAGNOSIS — K219 Gastro-esophageal reflux disease without esophagitis: Secondary | ICD-10-CM | POA: Diagnosis present

## 2019-03-13 DIAGNOSIS — G9341 Metabolic encephalopathy: Secondary | ICD-10-CM | POA: Diagnosis not present

## 2019-03-13 DIAGNOSIS — K5732 Diverticulitis of large intestine without perforation or abscess without bleeding: Secondary | ICD-10-CM | POA: Diagnosis present

## 2019-03-13 DIAGNOSIS — R339 Retention of urine, unspecified: Secondary | ICD-10-CM | POA: Diagnosis present

## 2019-03-13 DIAGNOSIS — E860 Dehydration: Secondary | ICD-10-CM | POA: Diagnosis present

## 2019-03-13 DIAGNOSIS — Z803 Family history of malignant neoplasm of breast: Secondary | ICD-10-CM

## 2019-03-13 DIAGNOSIS — Z888 Allergy status to other drugs, medicaments and biological substances status: Secondary | ICD-10-CM

## 2019-03-13 DIAGNOSIS — E785 Hyperlipidemia, unspecified: Secondary | ICD-10-CM

## 2019-03-13 DIAGNOSIS — R6520 Severe sepsis without septic shock: Secondary | ICD-10-CM | POA: Diagnosis present

## 2019-03-13 DIAGNOSIS — K5792 Diverticulitis of intestine, part unspecified, without perforation or abscess without bleeding: Secondary | ICD-10-CM

## 2019-03-13 LAB — COMPREHENSIVE METABOLIC PANEL
ALT: 19 U/L (ref 0–44)
AST: 24 U/L (ref 15–41)
Albumin: 4 g/dL (ref 3.5–5.0)
Alkaline Phosphatase: 101 U/L (ref 38–126)
Anion gap: 14 (ref 5–15)
BUN: 11 mg/dL (ref 8–23)
CO2: 23 mmol/L (ref 22–32)
Calcium: 9.1 mg/dL (ref 8.9–10.3)
Chloride: 100 mmol/L (ref 98–111)
Creatinine, Ser: 1.17 mg/dL — ABNORMAL HIGH (ref 0.44–1.00)
GFR calc Af Amer: 54 mL/min — ABNORMAL LOW (ref >60)
GFR calc non Af Amer: 46 mL/min — ABNORMAL LOW (ref >60)
Glucose, Bld: 161 mg/dL — ABNORMAL HIGH (ref 70–99)
Potassium: 4.1 mmol/L (ref 3.5–5.1)
Sodium: 137 mmol/L (ref 135–145)
Total Bilirubin: 0.6 mg/dL (ref 0.3–1.2)
Total Protein: 7 g/dL (ref 6.5–8.1)

## 2019-03-13 LAB — CBC WITH DIFFERENTIAL/PLATELET
Abs Immature Granulocytes: 0.06 10*3/uL (ref 0.00–0.07)
Basophils Absolute: 0.1 10*3/uL (ref 0.0–0.1)
Basophils Relative: 1 %
Eosinophils Absolute: 0 10*3/uL (ref 0.0–0.5)
Eosinophils Relative: 0 %
HCT: 44 % (ref 36.0–46.0)
Hemoglobin: 14.5 g/dL (ref 12.0–15.0)
Immature Granulocytes: 1 %
Lymphocytes Relative: 8 %
Lymphs Abs: 1.1 10*3/uL (ref 0.7–4.0)
MCH: 29.9 pg (ref 26.0–34.0)
MCHC: 33 g/dL (ref 30.0–36.0)
MCV: 90.7 fL (ref 80.0–100.0)
Monocytes Absolute: 0.3 10*3/uL (ref 0.1–1.0)
Monocytes Relative: 2 %
Neutro Abs: 11.6 10*3/uL — ABNORMAL HIGH (ref 1.7–7.7)
Neutrophils Relative %: 88 %
Platelets: 350 10*3/uL (ref 150–400)
RBC: 4.85 MIL/uL (ref 3.87–5.11)
RDW: 12.9 % (ref 11.5–15.5)
WBC: 13.1 10*3/uL — ABNORMAL HIGH (ref 4.0–10.5)
nRBC: 0 % (ref 0.0–0.2)

## 2019-03-13 LAB — POCT I-STAT EG7
Bicarbonate: 23 mmol/L (ref 20.0–28.0)
Calcium, Ion: 1.01 mmol/L — ABNORMAL LOW (ref 1.15–1.40)
HCT: 36 % (ref 36.0–46.0)
Hemoglobin: 12.2 g/dL (ref 12.0–15.0)
O2 Saturation: 95 %
Potassium: 3.5 mmol/L (ref 3.5–5.1)
Sodium: 139 mmol/L (ref 135–145)
TCO2: 24 mmol/L (ref 22–32)
pCO2, Ven: 33 mmHg — ABNORMAL LOW (ref 44.0–60.0)
pH, Ven: 7.451 — ABNORMAL HIGH (ref 7.250–7.430)
pO2, Ven: 69 mmHg — ABNORMAL HIGH (ref 32.0–45.0)

## 2019-03-13 LAB — FERRITIN: Ferritin: 133 ng/mL (ref 11–307)

## 2019-03-13 LAB — SODIUM, URINE, RANDOM: Sodium, Ur: 110 mmol/L

## 2019-03-13 LAB — LACTIC ACID, PLASMA
Lactic Acid, Venous: 2.7 mmol/L (ref 0.5–1.9)
Lactic Acid, Venous: 0.9 mmol/L (ref 0.5–1.9)
Lactic Acid, Venous: 1.1 mmol/L (ref 0.5–1.9)

## 2019-03-13 LAB — D-DIMER, QUANTITATIVE: D-Dimer, Quant: 5.18 ug/mL-FEU — ABNORMAL HIGH (ref 0.00–0.50)

## 2019-03-13 LAB — APTT: aPTT: 26 seconds (ref 24–36)

## 2019-03-13 LAB — CBG MONITORING, ED
Glucose-Capillary: 155 mg/dL — ABNORMAL HIGH (ref 70–99)
Glucose-Capillary: 101 mg/dL — ABNORMAL HIGH (ref 70–99)

## 2019-03-13 LAB — URINALYSIS, ROUTINE W REFLEX MICROSCOPIC
Bilirubin Urine: NEGATIVE
Glucose, UA: NEGATIVE mg/dL
Hgb urine dipstick: NEGATIVE
Ketones, ur: 20 mg/dL — AB
Leukocytes,Ua: NEGATIVE
Nitrite: NEGATIVE
Protein, ur: NEGATIVE mg/dL
Specific Gravity, Urine: 1.017 (ref 1.005–1.030)
pH: 8 (ref 5.0–8.0)

## 2019-03-13 LAB — MAGNESIUM: Magnesium: 1.7 mg/dL (ref 1.7–2.4)

## 2019-03-13 LAB — RESPIRATORY PANEL BY RT PCR (FLU A&B, COVID)
Influenza A by PCR: NEGATIVE
Influenza B by PCR: NEGATIVE
SARS Coronavirus 2 by RT PCR: NEGATIVE

## 2019-03-13 LAB — PROTIME-INR
INR: 0.9 (ref 0.8–1.2)
Prothrombin Time: 12.5 seconds (ref 11.4–15.2)

## 2019-03-13 LAB — FIBRINOGEN: Fibrinogen: 474 mg/dL (ref 210–475)

## 2019-03-13 LAB — TRIGLYCERIDES: Triglycerides: 57 mg/dL (ref <150)

## 2019-03-13 LAB — C-REACTIVE PROTEIN: CRP: 0.5 mg/dL (ref <1.0)

## 2019-03-13 LAB — CK: Total CK: 313 U/L — ABNORMAL HIGH (ref 38–234)

## 2019-03-13 LAB — CREATININE, URINE, RANDOM: Creatinine, Urine: 55.38 mg/dL

## 2019-03-13 LAB — POC SARS CORONAVIRUS 2 AG -  ED: SARS Coronavirus 2 Ag: NEGATIVE

## 2019-03-13 LAB — AMMONIA: Ammonia: 30 umol/L (ref 9–35)

## 2019-03-13 LAB — LACTATE DEHYDROGENASE: LDH: 182 U/L (ref 98–192)

## 2019-03-13 LAB — PROCALCITONIN: Procalcitonin: <0.1 ng/mL

## 2019-03-13 MED ORDER — VANCOMYCIN HCL 10 G IV SOLR
1250.0000 mg | INTRAVENOUS | Status: DC
  Filled 2019-03-13: qty 1250

## 2019-03-13 MED ORDER — METRONIDAZOLE IN NACL 5-0.79 MG/ML-% IV SOLN
500.0000 mg | Freq: Three times a day (TID) | INTRAVENOUS | Status: DC
  Administered 2019-03-14: 500 mg via INTRAVENOUS
  Filled 2019-03-13: qty 100

## 2019-03-13 MED ORDER — VANCOMYCIN HCL 10 G IV SOLR
2500.0000 mg | Freq: Once | INTRAVENOUS | Status: AC
  Administered 2019-03-13: 2500 mg via INTRAVENOUS
  Filled 2019-03-13: qty 2500

## 2019-03-13 MED ORDER — SODIUM CHLORIDE 0.9 % IV SOLN: 2.0000 g | Freq: Once | INTRAVENOUS | Status: DC

## 2019-03-13 MED ORDER — DIPHENHYDRAMINE HCL 50 MG/ML IJ SOLN
50.0000 mg | Freq: Once | INTRAMUSCULAR | Status: AC
  Administered 2019-03-14: 50 mg via INTRAVENOUS
  Filled 2019-03-13 (×2): qty 1

## 2019-03-13 MED ORDER — METRONIDAZOLE IN NACL 5-0.79 MG/ML-% IV SOLN
500.0000 mg | Freq: Once | INTRAVENOUS | Status: AC
  Administered 2019-03-13: 500 mg via INTRAVENOUS
  Filled 2019-03-13: qty 100

## 2019-03-13 MED ORDER — SODIUM CHLORIDE 0.9 % IV BOLUS (SEPSIS)
1000.0000 mL | Freq: Once | INTRAVENOUS | Status: AC
  Administered 2019-03-13: 1000 mL via INTRAVENOUS

## 2019-03-13 MED ORDER — HYDROCORTISONE NA SUCCINATE PF 250 MG IJ SOLR
200.0000 mg | Freq: Once | INTRAMUSCULAR | Status: AC
  Administered 2019-03-13: 200 mg via INTRAVENOUS
  Filled 2019-03-13: qty 200

## 2019-03-13 MED ORDER — SODIUM CHLORIDE 0.9 % IV BOLUS
500.0000 mL | Freq: Once | INTRAVENOUS | Status: AC
  Administered 2019-03-13: 500 mL via INTRAVENOUS

## 2019-03-13 MED ORDER — ACETAMINOPHEN 650 MG RE SUPP
650.0000 mg | Freq: Once | RECTAL | Status: AC
  Administered 2019-03-13: 16:00:00 650 mg via RECTAL
  Filled 2019-03-13: qty 1

## 2019-03-13 MED ORDER — INSULIN ASPART 100 UNIT/ML ~~LOC~~ SOLN
0.0000 [IU] | SUBCUTANEOUS | Status: DC
  Administered 2019-03-15 (×3): 1 [IU] via SUBCUTANEOUS
  Administered 2019-03-16 (×2): 2 [IU] via SUBCUTANEOUS
  Administered 2019-03-16: 1 [IU] via SUBCUTANEOUS

## 2019-03-13 MED ORDER — VANCOMYCIN HCL IN DEXTROSE 1-5 GM/200ML-% IV SOLN: 1000.0000 mg | Freq: Once | INTRAVENOUS | Status: DC

## 2019-03-13 MED ORDER — DIPHENHYDRAMINE HCL 25 MG PO CAPS
50.0000 mg | ORAL_CAPSULE | Freq: Once | ORAL | Status: AC
  Filled 2019-03-13: qty 2

## 2019-03-13 MED ORDER — SODIUM CHLORIDE 0.9 % IV SOLN
2.0000 g | Freq: Two times a day (BID) | INTRAVENOUS | Status: DC
  Administered 2019-03-13 – 2019-03-14 (×2): 2 g via INTRAVENOUS
  Filled 2019-03-13 (×3): qty 2

## 2019-03-13 NOTE — ED Provider Notes (Signed)
[facility] EMERGENCY DEPARTMENT Provider Note   CSN: [id number] Arrival date & time: 03/13/19  1355     History Chief Complaint  Patient presents with  . Altered Mental Status    Jasmine Cohen is a 73 y.o. female.  Level 5 caveat secondary to altered mental status.  EMS was called to this patient's house by a neighbor after the neighbor found her unresponsive.  Reportedly last known well was yesterday.  At baseline it sounds like she is very functional and drives.  Patient unable to give any history.  The history is provided by the EMS personnel. The history is limited by the condition of the patient.  Altered Mental Status Presenting symptoms: partial responsiveness   Severity:  Severe Most recent episode:  Today Timing:  Constant Progression:  Unchanged Chronicity:  New      Past Medical History:  Diagnosis Date  . Diabetes mellitus without complication (HCC)   . GERD (gastroesophageal reflux disease)   . Hypertension     Patient Active Problem List   Diagnosis Date Noted  . Abdominal pain 03/27/2014  . Leucocytosis 03/27/2014  . Diabetes mellitus type 2, controlled (HCC) 03/27/2014  . Hyperlipidemia 03/27/2014  . Fall 11/27/2012  . Avulsed toenail 11/27/2012  . Contusion of left knee 11/27/2012    Past Surgical History:  Procedure Laterality Date  . ABDOMINAL HYSTERECTOMY    . CHOLECYSTECTOMY       OB History   No obstetric history on file.     Family History  Problem Relation Age of Onset  . Breast cancer Mother   . Diabetes Mellitus II Neg Hx     Social History   Tobacco Use  . Smoking status: Never Smoker  . Smokeless tobacco: Never Used  Substance Use Topics  . Alcohol use: No  . Drug use: No    Home Medications Prior to Admission medications   Medication Sig Start Date End Date Taking? Authorizing Provider  acetaminophen (TYLENOL) 650 MG CR tablet Take 650 mg by mouth daily.    [provider]    atorvastatin (LIPITOR) 40 MG tablet Take 40 mg by mouth at bedtime.  12/24/13   [provider]  lisinopril (PRINIVIL,ZESTRIL) 20 MG tablet Take 20 mg by mouth daily.     [provider]  metFORMIN (GLUCOPHAGE) 1000 MG tablet Take 500 mg by mouth 2 (two) times daily with a meal.    [provider]  saccharomyces boulardii (FLORASTOR) 250 MG capsule Take 250 mg by mouth daily.    [provider]    Allergies    Codeine, Contrast media [iodinated diagnostic agents], and Vistaril [hydroxyzine]  Review of Systems   Review of Systems  Unable to perform ROS: Mental status change    Physical Exam Updated Vital Signs BP (!) 144/59   Pulse 87   Temp (!) 101.1 F (38.4 C) (Rectal)   Resp 18   Ht  (1.727 m)   Wt 113.4 kg   SpO2 97%   BMI 38.01 kg/m   Physical Exam Vitals and nursing note reviewed.  Constitutional:      General: She is not in acute distress.    Appearance: She is well-developed.  HENT:     Head: Normocephalic and atraumatic.  Eyes:     Conjunctiva/sclera: Conjunctivae normal.  Cardiovascular:     Rate and Rhythm: Normal rate and regular rhythm.     Heart sounds: No murmur.  Pulmonary:  Effort: Pulmonary effort is normal. No respiratory distress.     Breath sounds: Normal breath sounds.  Abdominal:     Palpations: Abdomen is soft.     Tenderness: There is no abdominal tenderness. There is no guarding or rebound.  Musculoskeletal:        General: No deformity. Normal range of motion.     Cervical back: Neck supple.     Right lower leg: No edema.     Left lower leg: No edema.  Skin:    General: Skin is warm and dry.     Capillary Refill: Capillary refill takes less than 2 seconds.  Neurological:     Comments: Patient is opening her eyes to voice and stimulation.  Not speaking.  Moving both upper extremities purposefully.     ED Results / Procedures / Treatments   Labs (all labs ordered are listed, but only  abnormal results are displayed) Labs Reviewed  CBC WITH DIFFERENTIAL/PLATELET - Abnormal; Notable for the following components:      Result Value   WBC 13.1 (*)    Neutro Abs 11.6 (*)    All other components within normal limits  COMPREHENSIVE METABOLIC PANEL - Abnormal; Notable for the following components:   Glucose, Bld 161 (*)    Creatinine, Ser 1.17 (*)    GFR calc non Af Amer 46 (*)    GFR calc Af Amer 54 (*)    All other components within normal limits  LACTIC ACID, PLASMA - Abnormal; Notable for the following components:   Lactic Acid, Venous 2.7 (*)    All other components within normal limits  D-DIMER, QUANTITATIVE (NOT AT [facility]) - Abnormal; Notable for the following components:   D-Dimer, Quant 5.18 (*)    All other components within normal limits  CBG MONITORING, ED - Abnormal; Notable for the following components:   Glucose-Capillary 155 (*)    All other components within normal limits  CULTURE, BLOOD (ROUTINE X 2)  CULTURE, BLOOD (ROUTINE X 2)  URINE CULTURE  RESPIRATORY PANEL BY RT PCR (FLU A&B, COVID)  AMMONIA  PROCALCITONIN  LACTATE DEHYDROGENASE  FERRITIN  FIBRINOGEN  C-REACTIVE PROTEIN  TRIGLYCERIDES  PROTIME-INR  URINALYSIS, ROUTINE W REFLEX MICROSCOPIC  POC SARS CORONAVIRUS 2 AG -  ED    EKG EKG Interpretation  Date/Time:  Tuesday March 13 2019 14:04:50 EST Ventricular Rate:  84 PR Interval:    QRS Duration: 81 QT Interval:  407 QTC Calculation: 482 R Axis:   24 Text Interpretation: Sinus rhythm Probable left atrial enlargement similar to prior 12/15 Confirmed by Aletta Edouard [id number]) on 03/13/2019 2:10:53 PM Also confirmed by Aletta Edouard [phone])  on 03/13/2019 2:27:03 PM   Radiology CT Head Wo Contrast  Result Date: 03/13/2019 CLINICAL DATA:  Encephalopathy, unresponsive EXAM: CT HEAD WITHOUT CONTRAST TECHNIQUE: Contiguous axial images were obtained from the base of the skull through the vertex without intravenous contrast.  COMPARISON:  December 13, 2012 FINDINGS: Brain: No evidence of acute territorial infarction, hemorrhage, hydrocephalus,extra-axial collection or mass lesion/mass effect. There is mild dilatation the ventricles and sulci consistent with age-related atrophy. Low-attenuation changes in the deep white matter consistent with small vessel ischemia. Vascular: No hyperdense vessel or unexpected calcification. Skull: The skull is intact. No fracture or focal lesion identified. Sinuses/Orbits: The visualized paranasal sinuses and mastoid air cells are clear. The orbits and globes intact. Other: None IMPRESSION: No acute intracranial abnormality. Findings consistent with mild age related atrophy and chronic small vessel ischemia Electronically Signed  By: Jonna ClarkBindu  Avutu M.D.   On: 03/13/2019 16:11   DG Chest Port 1 View  Result Date: 03/13/2019 CLINICAL DATA:  Altered mental status with chest pain and fever EXAM: PORTABLE CHEST 1 VIEW COMPARISON:  Aug 16, 2017 FINDINGS: There is no evident edema or consolidation. Heart size and pulmonary vascularity are normal. No adenopathy. No pneumothorax. There is degenerative change in the thoracic spine. IMPRESSION: No edema or consolidation.  No adenopathy. Electronically Signed   By: Bretta BangWilliam  Woodruff III M.D.   On: 03/13/2019 14:28    Procedures .Critical Care Performed by: Terrilee FilesButler, Aries Townley C, MD Authorized by: Terrilee FilesButler, Deaven Barron C, MD   Critical care provider statement:    Critical care time (minutes):  45   Critical care was time spent personally by me on the following activities:  Evaluation of patient's response to treatment, examination of patient, ordering and performing treatments and interventions, ordering and review of laboratory studies, ordering and review of radiographic studies, pulse oximetry, re-evaluation of patient's condition, obtaining history from patient or surrogate and review of old charts   I assumed direction of critical care for this patient from  another provider in my specialty: no     (including critical care time)  Medications Ordered in ED Medications  sodium chloride 0.9 % bolus 1,000 mL (has no administration in time range)    And  sodium chloride 0.9 % bolus 1,000 mL (has no administration in time range)    And  sodium chloride 0.9 % bolus 1,000 mL ( Intravenous Restarted 03/13/19 1649)  metroNIDAZOLE (FLAGYL) IVPB 500 mg (has no administration in time range)  vancomycin (VANCOCIN) 2,500 mg in sodium chloride 0.9 % 500 mL IVPB (2,500 mg Intravenous New Bag/Given 03/13/19 1702)  ceFEPIme (MAXIPIME) 2 g in sodium chloride 0.9 % 100 mL IVPB (2 g Intravenous New Bag/Given 03/13/19 1702)  vancomycin (VANCOCIN) 1,250 mg in sodium chloride 0.9 % 250 mL IVPB (has no administration in time range)  sodium chloride 0.9 % bolus 500 mL (0 mLs Intravenous Stopped 03/13/19 1649)  acetaminophen (TYLENOL) suppository 650 mg (650 mg Rectal Given 03/13/19 1629)    ED Course  I have reviewed the triage vital signs and the nursing notes.  Pertinent labs & imaging results that were available during my care of the patient were reviewed by me and considered in my medical decision making (see chart for details).  Clinical Course as of Mar 12 1706  Tue Mar 13, 2019  [id number] 73 year old female here with altered mental status last known well yesterday.  Febrile to 101.1 rectally.  Differential includes Covid, sepsis, CNS bleed, stroke, pneumonia, urosepsis, metabolic derangement.   [MB]  1413 Patient was signed out to Dr. Lynelle DoctorKnapp to follow-up on lab work and initiate antibiotics.  Patient will need to be admitted to the hospital.   [MB]  1428 Chest x-ray interpreted by me as no gross infiltrates.  Awaiting radiology reading.   [MB]    Clinical Course User Index [MB] Terrilee FilesButler, Scott Fix C, MD   MDM Rules/Calculators/A&P                     Jasmine Cohen was evaluated in Emergency Department on 03/13/2019 for the symptoms described in the history of  present illness. She was evaluated in the context of the global COVID-19 pandemic, which necessitated consideration that the patient might be at risk for infection with the SARS-CoV-2 virus that causes COVID-19. Institutional protocols and algorithms that pertain to the evaluation  of patients at risk for COVID-19 are in a state of rapid change based on information released by regulatory bodies including the CDC and federal and state organizations. These policies and algorithms were followed during the patient's care in the ED.    Final Clinical Impression(s) / ED Diagnoses Final diagnoses:  Altered mental status, unspecified altered mental status type  Sepsis, due to unspecified organism, unspecified whether acute organ dysfunction present [facility])    Rx / DC Orders ED Discharge Orders    None       Terrilee Files, MD 03/13/19 1709

## 2019-03-13 NOTE — ED Provider Notes (Signed)
Pt seen by Dr Melina Copa.  Please see his note.   Labs notable for elevated lactic acid level, ddimer.  UA negative for uti.  No definitive source of infection. Broad spectrum abx given to cover bacterial infection.   Prelim covid test negative, follow up test pending.  Will ct chest considering d dimer.  Consult with medical service for admission, further evaluation.   Dorie Rank, MD 03/13/19 [phone]

## 2019-03-13 NOTE — ED Triage Notes (Signed)
Pt from home via ems; LSN by neighbor sometime yesterday (EMS unsure what time); found in bed by neighbor today unresponsive; pt lives by self, normally a and o x 4; won't answer questions appropriately; asking for water; ems states that pt had a 2 minute bout of coughing, clear sputum produced; per neighbor, pt has an upcoming ortho appt for L foot; hx DM, HTN; 12 lead unremarkable with ems; BP 210/78, 98% RA

## 2019-03-13 NOTE — H&P (Addendum)
Jasmine Cohen HFG:[id number] DOB: [birth year] DOA: 03/13/2019     PCP: Leonard Downing, MD   Outpatient Specialists: NONE   Patient arrived to ER on 03/13/19 at 1355  Patient coming from: home Lives alone,       Chief Complaint:  Chief Complaint  Patient presents with  . Altered Mental Status    HPI: Jasmine Cohen is a 73 y.o. female with medical history significant of DM 2, HLD   HTN, GERD, Leukocytosis  Presented with  Episode of being found unresponsive, last time she was seen well was yesterday.   Patient was confused on emergency department unable to provide any history but there was concern for patient having a cough  Infectious risk factors:    altered mental status unable to provide history     In  ER RAPID COVID TEST  NEGATIVE   in house  PCR testing  Pending  Lab Results  Component Value Date   [place] 03/13/2019     Regarding pertinent Chronic problems:     Hyperlipidemia -  on statins Lipitor   HTN on lisinopril    DM 2 - PO meds only,        While in ER:  Patient found to be altered not providing history but was found to be febrile Up to 101.1 White blood cell count 13.1 Creatinine 1.1 D-dimer elevated 5.18 No source of infection noted on initial work-up including rapid Covid test  Given high possibility for sepsis patient started on broad-spectrum antibiotics CTA of chest and abdomen ordered although patient has allergies to contrast will need to be premedicated  Patient had about 500 mL urine and was retaining Foley placed   The following Work up has been ordered so far:  Orders Placed This Encounter  Procedures  . Critical Care  . Culture, blood (routine x 2)  . Urine culture  . Respiratory Panel by RT PCR (Flu A&B, Covid) - Nasopharyngeal Swab  . CT Head Wo Contrast  . DG Chest [facility]  . CT Angio Chest PE W and/or Wo Contrast  . CT ABDOMEN PELVIS W CONTRAST  . CBC with Differential  .  Comprehensive metabolic panel  . Urinalysis, Routine w reflex microscopic  . Lactic acid, plasma  . Ammonia  . D-dimer, quantitative  . Procalcitonin  . Lactate dehydrogenase  . Ferritin  . Fibrinogen  . C-reactive protein  . Triglycerides  . Protime-INR  . Lactic acid, plasma  . Diet NPO time specified  . Cardiac monitoring  . Cardiac monitoring  . Insert peripheral IV x 2  . Initiate Carrier Fluid Protocol  . Place surgical mask on patient  . Patient to wear surgical mask during transportation  . Assess patient for ability to self-prone. If able (can move self in bed, ambulate) and stable (SpO2 and oxygen requirement):  . RN/NT - Document specific oxygen requirements in CHL  . Notify EDP if new oxygen requirements escalates > 4L per minute [place]  . RN to draw the following extra tubes:  Marland Kitchen In and Out Cath  . Cardiac monitoring  . Refer to Sidebar Report: Sepsis Sidebar ED/IP  . Document vital signs within 1-hour of fluid bolus completion and notify provider of bolus completion  . Document height and weight  . Insert peripheral IV x 2  . Initiate Carrier Fluid Protocol  . In and Out Cath  . Initiate Code Sepsis ([facility] [phone]) Reason for Consult? tracking  .  pharmacy consult  . ceFEPime (MAXIPIME) per pharmacy consult  . vancomycin per pharmacy consult  . Consult to hospitalist  ALL PATIENTS BEING ADMITTED/HAVING PROCEDURES NEED COVID-19 SCREENING  . Airborne and Contact precautions  . Pulse oximetry, continuous  . Pulse oximetry, continuous  . Pulse oximetry, continuous  . CBG monitoring, ED  . POC SARS Coronavirus 2 Ag-ED - Nasal Swab (BD Veritor Kit)  . EKG 12-Lead  . ED EKG  . EKG 12-Lead  . Insert peripheral IV     Following Medications were ordered in ER: Medications  sodium chloride 0.9 % bolus 1,000 mL (has no administration in time range)    And  sodium chloride 0.9 % bolus 1,000 mL (has no administration in time range)    And  sodium  chloride 0.9 % bolus 1,000 mL ( Intravenous Restarted 03/13/19 1649)  metroNIDAZOLE (FLAGYL) IVPB 500 mg (500 mg Intravenous New Bag/Given 03/13/19 1809)  vancomycin (VANCOCIN) 2,500 mg in sodium chloride 0.9 % 500 mL IVPB (2,500 mg Intravenous New Bag/Given 03/13/19 1702)  ceFEPIme (MAXIPIME) 2 g in sodium chloride 0.9 % 100 mL IVPB (0 g Intravenous Stopped 03/13/19 1806)  vancomycin (VANCOCIN) 1,250 mg in sodium chloride 0.9 % 250 mL IVPB (has no administration in time range)  hydrocortisone sodium succinate (SOLU-CORTEF) injection 200 mg (has no administration in time range)  diphenhydrAMINE (BENADRYL) capsule 50 mg (has no administration in time range)    Or  diphenhydrAMINE (BENADRYL) injection 50 mg (has no administration in time range)  sodium chloride 0.9 % bolus 500 mL (0 mLs Intravenous Stopped 03/13/19 1649)  acetaminophen (TYLENOL) suppository 650 mg (650 mg Rectal Given 03/13/19 1629)        Consult Orders  (From admission, onward)         Start     Ordered   03/13/19 1806  Consult to hospitalist  ALL PATIENTS BEING ADMITTED/HAVING PROCEDURES NEED COVID-19 SCREENING  Once    Comments: ALL PATIENTS BEING ADMITTED/HAVING PROCEDURES NEED COVID-19 SCREENING  Provider:  (Not yet assigned)  Question Answer Comment  Place call to: [facility]   Reason for Consult Admit      03/13/19 1805           Significant initial  Findings: Abnormal Labs Reviewed  CBC WITH DIFFERENTIAL/PLATELET - Abnormal; Notable for the following components:      Result Value   WBC 13.1 (*)    Neutro Abs 11.6 (*)    All other components within normal limits  COMPREHENSIVE METABOLIC PANEL - Abnormal; Notable for the following components:   Glucose, Bld 161 (*)    Creatinine, Ser 1.17 (*)    GFR calc non Af Amer 46 (*)    GFR calc Af Amer 54 (*)    All other components within normal limits  URINALYSIS, ROUTINE W REFLEX MICROSCOPIC - Abnormal; Notable for the following components:    Ketones, ur 20 (*)    All other components within normal limits  LACTIC ACID, PLASMA - Abnormal; Notable for the following components:   Lactic Acid, Venous 2.7 (*)    All other components within normal limits  D-DIMER, QUANTITATIVE (NOT AT [facility]) - Abnormal; Notable for the following components:   D-Dimer, Quant 5.18 (*)    All other components within normal limits  CBG MONITORING, ED - Abnormal; Notable for the following components:   Glucose-Capillary 155 (*)    All other components within normal limits    Otherwise labs showing:    Recent Labs  Lab 03/13/19 1410  NA 137  K 4.1  CO2 23  GLUCOSE 161*  BUN 11  CREATININE 1.17*  CALCIUM 9.1    Cr   Up from baseline see below Lab Results  Component Value Date   CREATININE 1.17 (H) 03/13/2019   CREATININE 0.89 03/31/2018   CREATININE 1.05 (H) 11/18/2014    Recent Labs  Lab 03/13/19 1410  AST 24  ALT 19  ALKPHOS 101  BILITOT 0.6  PROT 7.0  ALBUMIN 4.0   Lab Results  Component Value Date   CALCIUM 9.1 03/13/2019    WBC      Component Value Date/Time   WBC 13.1 (H) 03/13/2019 1410   ANC    Component Value Date/Time   NEUTROABS 11.6 (H) 03/13/2019 1410   ALC No components found for: LYMPHAB    Plt: Lab Results  Component Value Date   PLT 350 03/13/2019     Lactic Acid, Venous    Component Value Date/Time   LATICACIDVEN 2.7 (HH) 03/13/2019 1410    Procalcitonin <0.10   COVID-19 Labs  Recent Labs    03/13/19 1413  DDIMER 5.18*  FERRITIN 133  LDH 182  CRP 0.5    Lab Results  Component Value Date   SARSCOV2NAA NEGATIVE 03/13/2019   Venous  Blood Gas result:  pH 7.451 pCO233;   ABG    Component Value Date/Time   HCO3 23.0 03/13/2019 2108   TCO2 24 03/13/2019 2108   O2SAT 95.0 03/13/2019 2108      HG/HCT  stable,       Component Value Date/Time   HGB 14.5 03/13/2019 1410   HCT 44.0 03/13/2019 1410    No results for input(s): LIPASE, AMYLASE in the last 168 hours. Recent  Labs  Lab 03/13/19 1410  AMMONIA 30    No components found for: LABALBU    Cardiac Panel (last 3 results) Recent Labs    03/13/19 1900  CKTOTAL 313*       ECG: Ordered Personally reviewed by me showing: HR : 84 Rhythm:  NSR,    no evidence of ischemic changes QTC 482   DM  labs:  HbA1C: No results for input(s): HGBA1C in the last 8760 hours.     CBG (last 3)  Recent Labs    03/13/19 1404  GLUCAP 155*     UA no evidence of UTI      Urine analysis:    Component Value Date/Time   COLORURINE YELLOW 03/13/2019 [place] 03/13/2019 1644   LABSPEC 1.017 03/13/2019 1644   PHURINE 8.0 03/13/2019 1644   GLUCOSEU NEGATIVE 03/13/2019 1644   HGBUR NEGATIVE 03/13/2019 1644   BILIRUBINUR NEGATIVE 03/13/2019 1644   KETONESUR 20 (A) 03/13/2019 1644   PROTEINUR NEGATIVE 03/13/2019 1644   UROBILINOGEN 1.0 03/26/2014 2319   NITRITE NEGATIVE 03/13/2019 1644   LEUKOCYTESUR NEGATIVE 03/13/2019 1644    Ordered  CT HEAD chronic small vessel ischemia  CXR -  NON acute   CTA chest and ABD Pending   ED Triage Vitals  Enc Vitals Group     BP 03/13/19 1402 (!) 154/68     Pulse Rate 03/13/19 1404 81     Resp 03/13/19 1402 12     Temp 03/13/19 1402 (!) 101.1 F (38.4 C)     Temp Source 03/13/19 1402 Rectal     SpO2 03/13/19 1404 96 %     Weight 03/13/19 1358 250 lb (113.4 kg)     Height  03/13/19 1358 5' 8"  (1.727 m)     Head Circumference --      Peak Flow --      Pain Score --      Pain Loc --      Pain Edu? --      Excl. in [place]? --   TMAX(24)@       Latest  Blood pressure (!) 146/55, pulse 93, temperature (!) 101.1 F (38.4 C), temperature source Rectal, resp. rate (!) 26, height 5' 8"  (1.727 m), weight 113.4 kg, SpO2 99 %.     Hospitalist was called for admission for SIRS from unknown etiology and acute encephalopathy    Review of Systems:    Pertinent positives include: cough, confusion  Constitutional:  No weight loss, night sweats,  Fevers, chills, fatigue, weight loss  HEENT:  No headaches, Difficulty swallowing,Tooth/dental problems,Sore throat,  No sneezing, itching, ear ache, nasal congestion, post nasal drip,  Cardio-vascular:  No chest pain, Orthopnea, PND, anasarca, dizziness, palpitations.no Bilateral lower extremity swelling  GI:  No heartburn, indigestion, abdominal pain, nausea, vomiting, diarrhea, change in bowel habits, loss of appetite, melena, blood in stool, hematemesis Resp:  no shortness of breath at rest. No dyspnea on exertion, No excess mucus, no productive cough, No non-productive cough, No coughing up of blood.No change in color of mucus.No wheezing. Skin:  no rash or lesions. No jaundice GU:  no dysuria, change in color of urine, no urgency or frequency. No straining to urinate.  No flank pain.  Musculoskeletal:  No joint pain or no joint swelling. No decreased range of motion. No back pain.  Psych:  No change in mood or affect. No depression or anxiety. No memory loss.  Neuro: no localizing neurological complaints, no tingling, no weakness, no double vision, no gait abnormality, no slurred speech, no confusion  All systems reviewed and apart from [place] all are negative  Past Medical History:   Past Medical History:  Diagnosis Date  . Diabetes mellitus without complication ([place])   . GERD (gastroesophageal reflux disease)   . Hypertension       Past Surgical History:  Procedure Laterality Date  . ABDOMINAL HYSTERECTOMY    . CHOLECYSTECTOMY      Social History:  Ambulatory   independently      reports that she has never smoked. She has never used smokeless tobacco. She reports that she does not drink alcohol or use drugs.     Family History:   Family History  Problem Relation Age of Onset  . Breast cancer Mother   . Diabetes Mellitus II Neg Hx     Allergies: Allergies  Allergen Reactions  . Codeine     Numbness and tingling  . Contrast Media [Iodinated Diagnostic  Agents] Hives  . Vistaril [Hydroxyzine] Other (See Comments)    hallucinations     Prior to Admission medications   Medication Sig Start Date End Date Taking? Authorizing Provider  acetaminophen (TYLENOL) 650 MG CR tablet Take 650 mg by mouth daily.    [provider]  atorvastatin (LIPITOR) 40 MG tablet Take 40 mg by mouth at bedtime.  12/24/13   [provider]  lisinopril (PRINIVIL,ZESTRIL) 20 MG tablet Take 20 mg by mouth daily.     [provider]  metFORMIN (GLUCOPHAGE) 1000 MG tablet Take 500 mg by mouth 2 (two) times daily with a meal.    [provider]  saccharomyces boulardii (FLORASTOR) 250 MG capsule Take 250 mg by mouth daily.  [provider]   Physical Exam: Blood pressure (!) 146/55, pulse 93, temperature (!) 101.1 F (38.4 C), temperature source Rectal, resp. rate (!) 26, height 5' 8"  (1.727 m), weight 113.4 kg, SpO2 99 %. 1. General:  in No Acute distress    Chronically ill  -appearing 2. Psychological: Alert and  Oriented 3. Head/ENT:   Dry Mucous Membranes                          Head Non traumatic, neck supple                          Poor Dentition 4. SKIN:   decreased Skin turgor,  Skin clean Dry and intact no rash 5. Heart: Regular rate and rhythm no   Murmur, no Rub or gallop 6. Lungs:  no wheezes or crackles   7. Abdomen: Soft,  non-tender, Non distended   obese  bowel sounds present 8. Lower extremities: no clubbing, cyanosis, no        edema 9. Neurologically Grossly intact, moving all 4 extremities equally intact 10. MSK: Normal range of motion   All other LABS:     Recent Labs  Lab 03/13/19 1410  WBC 13.1*  NEUTROABS 11.6*  HGB 14.5  HCT 44.0  MCV 90.7  PLT 350     Recent Labs  Lab 03/13/19 1410  NA 137  K 4.1  CL 100  CO2 23  GLUCOSE 161*  BUN 11  CREATININE 1.17*  CALCIUM 9.1     Recent Labs  Lab 03/13/19 1410  AST 24  ALT 19  ALKPHOS 101  BILITOT 0.6  PROT 7.0  ALBUMIN  4.0       Cultures:    Component Value Date/Time   SDES URINE, CLEAN CATCH 12/13/2012 1133   SPECREQUEST NONE 12/13/2012 1133   CULT NO GROWTH Performed at [facility] 12/13/2012 1133   REPTSTATUS 12/14/2012 FINAL 12/13/2012 1133     Radiological Exams on Admission: CT Head Wo Contrast  Result Date: 03/13/2019 CLINICAL DATA:  Encephalopathy, unresponsive EXAM: CT HEAD WITHOUT CONTRAST TECHNIQUE: Contiguous axial images were obtained from the base of the skull through the vertex without intravenous contrast. COMPARISON:  December 13, 2012 FINDINGS: Brain: No evidence of acute territorial infarction, hemorrhage, hydrocephalus,extra-axial collection or mass lesion/mass effect. There is mild dilatation the ventricles and sulci consistent with age-related atrophy. Low-attenuation changes in the deep white matter consistent with small vessel ischemia. Vascular: No hyperdense vessel or unexpected calcification. Skull: The skull is intact. No fracture or focal lesion identified. Sinuses/Orbits: The visualized paranasal sinuses and mastoid air cells are clear. The orbits and globes intact. Other: None IMPRESSION: No acute intracranial abnormality. Findings consistent with mild age related atrophy and chronic small vessel ischemia Electronically Signed   By: Prudencio Pair M.D.   On: 03/13/2019 16:11   DG Chest Port 1 View  Result Date: 03/13/2019 CLINICAL DATA:  Altered mental status with chest pain and fever EXAM: PORTABLE CHEST 1 VIEW COMPARISON:  Aug 16, 2017 FINDINGS: There is no evident edema or consolidation. Heart size and pulmonary vascularity are normal. No adenopathy. No pneumothorax. There is degenerative change in the thoracic spine. IMPRESSION: No edema or consolidation.  No adenopathy. Electronically Signed   By: Lowella Grip III M.D.   On: 03/13/2019 14:28    Chart has been reviewed    Assessment/Plan  73 y.o. female with medical  history significant of DM 2, HLD    HTN, GERD, Leukocytosis Admitted for sirs with unclear etiology   Present on Admission: Acute encephalopathy -   - most likely multifactorial secondary to combination of  Infection mild dehydration secondary to decreased by mouth intake,    - Will rehydrate   - treat underlining infection    - if no improvement may need further imaging to evaluate for CNS pathology pathology such as MRI of the brain   - neurological exam appears to be nonfocal but patient unable to cooperate fully   - VBG unremarkable no evidence of hypercarbia    - no history of liver disease ammonia unremarkable   . SIRS (systemic inflammatory response syndrome) (HCC) -unclear source of infection at this time CT chest and abdomen pending patient needed to be premedicated. For now continue broad-spectrum antibiotics Blood and urine culture.   Neck supple doubt meningitis At this point respiratory panel Covid and influenza were all negative. Continue precautions for tonight until either source is determined repeat Covid testing negative  . Essential hypertension -chronic stable given evidence of possible sepsis we will hold off on blood pressure medications for tonight  . AKI (acute kidney injury) ([place]) rehydrate obtain urine electrolytes Foley in place given the urinary retention await results of further imaging UA without evidence of infection  Dm2 -  - Order Sensitive SSI   -  check TSH and HgA1C  - Hold by mouth medications . Hyperlipidemia -  . Acute metabolic encephalopathy  Other plan as per orders.  DVT prophylaxis:  SCD     Code Status:  FULL CODE   Family Communication:   Family not at  Bedside    Disposition Plan:          To home once workup is complete and patient is stable                       Consults called: none  Admission status:  ED Disposition    None      Obs       Level of care   tele  For  24H     please discontinue once patient no longer qualifies   Precautions: admitted  as  PUI  Airborne and Contact precautions  If Covid PCR is negative   would need additional investigation given very high risk for false negative test result   PPE: Used by the provider:   P100  eye Goggles,  Gloves  gown     Flor Houdeshell 03/13/2019, 11:25 PM    [facility]     after 2 AM please page floor coverage PA If 7AM-7PM, please contact the day team taking care of the patient using [email or web address]

## 2019-03-13 NOTE — Progress Notes (Signed)
Pharmacy Antibiotic Note  TRANESHA LESSNER is a 73 y.o. female admitted on 03/13/2019 with infection of unknown source/sepsis.  Pharmacy has been consulted for vancomycin/cefepime dosing. Tmax/24h 101.1, WBC 13.1, LA 2.7. SCr 1.17 on admit.  Plan: Cefepime 2g IV q12h Vancomycin 2500mg  IV x1; then Vancomycin 1250 mg IV Q 24 hrs. Goal AUC 400-550. Expected AUC: 547. SCr used: 1.17 Flagyl 500mg  IV x 1 per EDP - f/u if to continue Monitor clinical progress, c/s, renal function F/u de-escalation plan/LOT, vancomycin levels as indicated   Height: 5\' 8"  (172.7 cm) Weight: 250 lb (113.4 kg) IBW/kg (Calculated) : 63.9  Temp (24hrs), Avg:101.1 F (38.4 C), Min:101.1 F (38.4 C), Max:101.1 F (38.4 C)  Recent Labs  Lab 03/13/19 1410  WBC 13.1*  CREATININE 1.17*  LATICACIDVEN 2.7*    Estimated Creatinine Clearance: 56.6 mL/min (A) (by C-G formula based on SCr of 1.17 mg/dL (H)).    Allergies  Allergen Reactions  . Codeine     Numbness and tingling  . Contrast Media [Iodinated Diagnostic Agents] Hives  . Vistaril [Hydroxyzine] Other (See Comments)    hallucinations    Antimicrobials this admission: 12/15 vancomycin >>  12/15 cefepime >>  12/15 flagyl x 1  Dose adjustments this admission:   Microbiology results:   Elicia Lamp, PharmD, BCPS Clinical Pharmacist 03/13/2019 3:32 PM

## 2019-03-13 NOTE — Progress Notes (Signed)
Notified bedside nurse of need to order and draw repeat lactic acid. 

## 2019-03-14 ENCOUNTER — Observation Stay (HOSPITAL_COMMUNITY): Payer: Medicare Other

## 2019-03-14 ENCOUNTER — Inpatient Hospital Stay (HOSPITAL_COMMUNITY): Payer: Medicare Other

## 2019-03-14 DIAGNOSIS — G9341 Metabolic encephalopathy: Secondary | ICD-10-CM | POA: Diagnosis present

## 2019-03-14 DIAGNOSIS — E785 Hyperlipidemia, unspecified: Secondary | ICD-10-CM | POA: Diagnosis present

## 2019-03-14 DIAGNOSIS — E119 Type 2 diabetes mellitus without complications: Secondary | ICD-10-CM | POA: Diagnosis present

## 2019-03-14 DIAGNOSIS — Z7984 Long term (current) use of oral hypoglycemic drugs: Secondary | ICD-10-CM | POA: Diagnosis not present

## 2019-03-14 DIAGNOSIS — Z803 Family history of malignant neoplasm of breast: Secondary | ICD-10-CM | POA: Diagnosis not present

## 2019-03-14 DIAGNOSIS — K219 Gastro-esophageal reflux disease without esophagitis: Secondary | ICD-10-CM | POA: Diagnosis present

## 2019-03-14 DIAGNOSIS — Z20828 Contact with and (suspected) exposure to other viral communicable diseases: Secondary | ICD-10-CM | POA: Diagnosis present

## 2019-03-14 DIAGNOSIS — E876 Hypokalemia: Secondary | ICD-10-CM | POA: Diagnosis not present

## 2019-03-14 DIAGNOSIS — K5732 Diverticulitis of large intestine without perforation or abscess without bleeding: Secondary | ICD-10-CM | POA: Diagnosis present

## 2019-03-14 DIAGNOSIS — R55 Syncope and collapse: Secondary | ICD-10-CM | POA: Diagnosis present

## 2019-03-14 DIAGNOSIS — K5792 Diverticulitis of intestine, part unspecified, without perforation or abscess without bleeding: Secondary | ICD-10-CM

## 2019-03-14 DIAGNOSIS — I10 Essential (primary) hypertension: Secondary | ICD-10-CM | POA: Diagnosis present

## 2019-03-14 DIAGNOSIS — H9190 Unspecified hearing loss, unspecified ear: Secondary | ICD-10-CM | POA: Diagnosis present

## 2019-03-14 DIAGNOSIS — Z885 Allergy status to narcotic agent status: Secondary | ICD-10-CM | POA: Diagnosis not present

## 2019-03-14 DIAGNOSIS — Z91041 Radiographic dye allergy status: Secondary | ICD-10-CM | POA: Diagnosis not present

## 2019-03-14 DIAGNOSIS — Z90710 Acquired absence of both cervix and uterus: Secondary | ICD-10-CM | POA: Diagnosis not present

## 2019-03-14 DIAGNOSIS — R6520 Severe sepsis without septic shock: Secondary | ICD-10-CM | POA: Diagnosis present

## 2019-03-14 DIAGNOSIS — A419 Sepsis, unspecified organism: Secondary | ICD-10-CM | POA: Diagnosis present

## 2019-03-14 DIAGNOSIS — Z888 Allergy status to other drugs, medicaments and biological substances status: Secondary | ICD-10-CM | POA: Diagnosis not present

## 2019-03-14 DIAGNOSIS — R339 Retention of urine, unspecified: Secondary | ICD-10-CM | POA: Diagnosis present

## 2019-03-14 DIAGNOSIS — N179 Acute kidney failure, unspecified: Secondary | ICD-10-CM | POA: Diagnosis present

## 2019-03-14 DIAGNOSIS — E860 Dehydration: Secondary | ICD-10-CM | POA: Diagnosis present

## 2019-03-14 LAB — GLUCOSE, CAPILLARY
Glucose-Capillary: 108 mg/dL — ABNORMAL HIGH (ref 70–99)
Glucose-Capillary: 111 mg/dL — ABNORMAL HIGH (ref 70–99)
Glucose-Capillary: 112 mg/dL — ABNORMAL HIGH (ref 70–99)
Glucose-Capillary: 113 mg/dL — ABNORMAL HIGH (ref 70–99)
Glucose-Capillary: 95 mg/dL (ref 70–99)
Glucose-Capillary: 98 mg/dL (ref 70–99)

## 2019-03-14 LAB — CBC
HCT: 37.6 % (ref 36.0–46.0)
Hemoglobin: 12.6 g/dL (ref 12.0–15.0)
MCH: 30.1 pg (ref 26.0–34.0)
MCHC: 33.5 g/dL (ref 30.0–36.0)
MCV: 89.7 fL (ref 80.0–100.0)
Platelets: UNDETERMINED 10*3/uL (ref 150–400)
RBC: 4.19 MIL/uL (ref 3.87–5.11)
RDW: 13.1 % (ref 11.5–15.5)
WBC: 10.5 10*3/uL (ref 4.0–10.5)
nRBC: 0 % (ref 0.0–0.2)

## 2019-03-14 LAB — URINE CULTURE: Culture: NO GROWTH

## 2019-03-14 LAB — COMPREHENSIVE METABOLIC PANEL
ALT: 14 U/L (ref 0–44)
AST: 19 U/L (ref 15–41)
Albumin: 3.1 g/dL — ABNORMAL LOW (ref 3.5–5.0)
Alkaline Phosphatase: 73 U/L (ref 38–126)
Anion gap: 14 (ref 5–15)
BUN: 8 mg/dL (ref 8–23)
CO2: 20 mmol/L — ABNORMAL LOW (ref 22–32)
Calcium: 7.9 mg/dL — ABNORMAL LOW (ref 8.9–10.3)
Chloride: 104 mmol/L (ref 98–111)
Creatinine, Ser: 0.92 mg/dL (ref 0.44–1.00)
GFR calc Af Amer: >60 mL/min (ref >60)
GFR calc non Af Amer: >60 mL/min (ref >60)
Glucose, Bld: 131 mg/dL — ABNORMAL HIGH (ref 70–99)
Potassium: 3.6 mmol/L (ref 3.5–5.1)
Sodium: 138 mmol/L (ref 135–145)
Total Bilirubin: 0.9 mg/dL (ref 0.3–1.2)
Total Protein: 5.6 g/dL — ABNORMAL LOW (ref 6.5–8.1)

## 2019-03-14 LAB — ETHANOL: Alcohol, Ethyl (B): <10 mg/dL (ref <10)

## 2019-03-14 LAB — CBG MONITORING, ED: Glucose-Capillary: 102 mg/dL — ABNORMAL HIGH (ref 70–99)

## 2019-03-14 LAB — RAPID URINE DRUG SCREEN, HOSP PERFORMED
Amphetamines: NOT DETECTED
Barbiturates: NOT DETECTED
Benzodiazepines: NOT DETECTED
Cocaine: NOT DETECTED
Opiates: NOT DETECTED
Tetrahydrocannabinol: NOT DETECTED

## 2019-03-14 LAB — HEMOGLOBIN A1C
Hgb A1c MFr Bld: 6.8 % — ABNORMAL HIGH (ref 4.8–5.6)
Mean Plasma Glucose: 148.46 mg/dL

## 2019-03-14 LAB — TROPONIN I (HIGH SENSITIVITY)
Troponin I (High Sensitivity): 9 ng/L (ref <18)
Troponin I (High Sensitivity): 17 ng/L (ref <18)

## 2019-03-14 LAB — MAGNESIUM: Magnesium: 1.3 mg/dL — ABNORMAL LOW (ref 1.7–2.4)

## 2019-03-14 LAB — MRSA PCR SCREENING: MRSA by PCR: NEGATIVE

## 2019-03-14 LAB — LACTIC ACID, PLASMA: Lactic Acid, Venous: 1.4 mmol/L (ref 0.5–1.9)

## 2019-03-14 LAB — TSH: TSH: 0.488 u[IU]/mL (ref 0.350–4.500)

## 2019-03-14 LAB — PHOSPHORUS: Phosphorus: 2.8 mg/dL (ref 2.5–4.6)

## 2019-03-14 MED ORDER — ONDANSETRON HCL 4 MG PO TABS: 4.0000 mg | ORAL_TABLET | Freq: Four times a day (QID) | ORAL | Status: DC | PRN

## 2019-03-14 MED ORDER — IOHEXOL 350 MG/ML SOLN
100.0000 mL | Freq: Once | INTRAVENOUS | Status: AC | PRN
  Administered 2019-03-14: 100 mL via INTRAVENOUS

## 2019-03-14 MED ORDER — ACETAMINOPHEN 325 MG PO TABS: 650.0000 mg | ORAL_TABLET | Freq: Four times a day (QID) | ORAL | Status: DC | PRN

## 2019-03-14 MED ORDER — ACETAMINOPHEN 650 MG RE SUPP: 650.0000 mg | Freq: Four times a day (QID) | RECTAL | Status: DC | PRN

## 2019-03-14 MED ORDER — MAGNESIUM SULFATE 2 GM/50ML IV SOLN
2.0000 g | Freq: Once | INTRAVENOUS | Status: AC
  Administered 2019-03-14: 2 g via INTRAVENOUS
  Filled 2019-03-14: qty 50

## 2019-03-14 MED ORDER — SODIUM CHLORIDE 0.9 % IV SOLN: INTRAVENOUS | Status: DC

## 2019-03-14 MED ORDER — ATORVASTATIN CALCIUM 40 MG PO TABS
40.0000 mg | ORAL_TABLET | Freq: Every day | ORAL | Status: DC
  Administered 2019-03-15 – 2019-03-16 (×2): 40 mg via ORAL
  Filled 2019-03-14 (×3): qty 1

## 2019-03-14 MED ORDER — PIPERACILLIN-TAZOBACTAM 3.375 G IVPB
3.3750 g | Freq: Three times a day (TID) | INTRAVENOUS | Status: DC
  Administered 2019-03-14 – 2019-03-16 (×6): 3.375 g via INTRAVENOUS
  Filled 2019-03-14 (×8): qty 50

## 2019-03-14 MED ORDER — ONDANSETRON HCL 4 MG/2ML IJ SOLN: 4.0000 mg | Freq: Four times a day (QID) | INTRAMUSCULAR | Status: DC | PRN

## 2019-03-14 MED ORDER — CHLORHEXIDINE GLUCONATE CLOTH 2 % EX PADS
6.0000 | MEDICATED_PAD | Freq: Every day | CUTANEOUS | Status: DC
  Administered 2019-03-14 – 2019-03-17 (×4): 6 via TOPICAL

## 2019-03-14 NOTE — ED Notes (Signed)
Report attempted, per CN RN on break unable to get report now.

## 2019-03-14 NOTE — Progress Notes (Signed)
Spoke w/ Judeen Hammans (Grandson Steve's wife) - updated family on pt location, visitor hours, pt status - resting comfortably. Family has no other requests at this time - explained that will have AM RN call tomorrow with updates if possible.

## 2019-03-14 NOTE — Progress Notes (Signed)
Patient was admitted with AMS and decreased LOC

## 2019-03-14 NOTE — Evaluation (Signed)
Physical Therapy Evaluation Patient Details Name: Jasmine Cohen MRN: [id number] DOB: [birth year] Today's Date: 03/14/2019   History of Present Illness  73yo female last seen normal by neighbor at unknown time on 12/14, now found unresponsive. Suspected to have infection of unknown source with high probability for sepsis. Covid negative at admit. Head CT and CTA negative. PMH HTN, DM, HLD  Clinical Impression   Patient received in bed, lethargic and briefly opening eyes with loud verbal/vigorous tactile stimulation but unable to follow simple commands. Rolled over one time spontaneously, otherwise required totalA for all mobility and became physically resistant and made uncomfortable faces when PT attempted mobility and performed PROM especially of RLE. Does open eyes and make facial expressions to uncomfortable stimuli but otherwise lethargic, will require +2 assist to progress mobility. PROM of all four limbs appears WNL as much as patient was able to perform given patient's intermittent physical resistance, seems to have painful R knee and ankle. She was left in bed with all needs met, bed alarm active and blinds open/lights on this morning. Currently recommending SNF and 24/7A, however will plan to update if appropriate moving forward.     Follow Up Recommendations SNF;Supervision/Assistance - 24 hour;Other (comment)(will update if appropriate as/if cognition and participation improves)    Equipment Recommendations  Other (comment)(TBD)    Recommendations for Other Services       Precautions / Restrictions Precautions Precautions: Fall;Other (comment) Precaution Comments: hx of foot pain Restrictions Weight Bearing Restrictions: No      Mobility  Bed Mobility Overal bed mobility: Needs Assistance Bed Mobility: Rolling;Supine to Sit Rolling: Total assist   Supine to sit: Total assist;HOB elevated     General bed mobility comments: totalA for all mobility, patient physically  resisting so attempted but unable to perform supine to sit even with HOB elevated significantly. However at one point patient did roll over to her side with min guard  Transfers                 General transfer comment: limited by lethargy- will need +2 to progress  Ambulation/Gait             General Gait Details: limited by lethargy- will need +2 to progress  Stairs            Wheelchair Mobility    Modified Rankin (Stroke Patients Only)       Balance                                             Pertinent Vitals/Pain Pain Assessment: Faces Faces Pain Scale: Hurts little more Pain Location: discomfort with R ankle and knee PROM Pain Descriptors / Indicators: Discomfort;Sore Pain Intervention(s): Limited activity within patient's tolerance;Monitored during session;Repositioned    Home Living Family/patient expects to be discharged to:: Private residence Living Arrangements: Alone               Additional Comments: patient A&Ox0, unable to provide information and no prior information is in the chart. MD/RN notes indicate she was very independent and drove.    Prior Function Level of Independence: Independent         Comments: MD/RN notes indicate she was very independent, drove     Hand Dominance        Extremity/Trunk Assessment   Upper Extremity Assessment Upper Extremity Assessment: RUE deficits/detail;LUE deficits/detail;Difficult to  assess due to impaired cognition RUE Deficits / Details: limited due to lethargy, PROM generally appears WNL but patient moderately resisting LUE Deficits / Details: limited due to lethargy, PROM generally appears WNL but patient moderately resisting    Lower Extremity Assessment Lower Extremity Assessment: RLE deficits/detail;LLE deficits/detail RLE Deficits / Details: limited due to lethargy, PROM generally appears WNL but patient moderately resisting, seems to have painful R Knee  and ankle with PROM LLE Deficits / Details: limited due to lethargy, PROM generally appears WNL but patient moderately resisting    Cervical / Trunk Assessment Cervical / Trunk Assessment: Kyphotic  Communication   Communication: Other (comment)(communication limited by lethargy)  Cognition Arousal/Alertness: Lethargic Behavior During Therapy: Flat affect Overall Cognitive Status: No family/caregiver present to determine baseline cognitive functioning Area of Impairment: Orientation;Attention;Memory;Following commands;Safety/judgement;Awareness;Problem solving                 Orientation Level: Disoriented to;Person;Place;Time;Situation Current Attention Level: Focused Memory: Decreased recall of precautions;Decreased short-term memory Following Commands: Follows one step commands inconsistently Safety/Judgement: Decreased awareness of safety;Decreased awareness of deficits Awareness: Intellectual Problem Solving: Slow processing;Decreased initiation;Difficulty sequencing;Requires verbal cues;Requires tactile cues General Comments: A&Ox0, unable to follow simple commands but physically resistant to PT attempts at [place] mobility; does respond to localized stimulation such as blankets being taken off, PROM of R knee/ankle, etc      General Comments General comments (skin integrity, edema, etc.): unable to get to EOB to assess balance today    Exercises     Assessment/Plan    PT Assessment Patient needs continued PT services  PT Problem List Decreased strength;Decreased cognition;Decreased knowledge of use of DME;Obesity;Decreased activity tolerance;Decreased safety awareness;Decreased balance;Decreased knowledge of precautions;Decreased mobility;Decreased coordination       PT Treatment Interventions DME instruction;Balance training;Gait training;Neuromuscular re-education;Stair training;Cognitive remediation;Functional mobility training;Patient/family education;Therapeutic  activities;Therapeutic exercise    PT Goals (Current goals can be found in the Care Plan section)  Acute Rehab PT Goals PT Goal Formulation: Patient unable to participate in goal setting Time For Goal Achievement: 03/28/19 Potential to Achieve Goals: Fair    Frequency Min 2X/week   Barriers to discharge Other (comment) lives alone, unsure how much support she has from family/friends/neighbors and what equipment she has    Co-evaluation               AM-PAC PT "6 Clicks" Mobility  Outcome Measure Help needed turning from your back to your side while in a flat bed without using bedrails?: Total Help needed moving from lying on your back to sitting on the side of a flat bed without using bedrails?: Total Help needed moving to and from a bed to a chair (including a wheelchair)?: Total Help needed standing up from a chair using your arms (e.g., wheelchair or bedside chair)?: Total Help needed to walk in hospital room?: Total Help needed climbing 3-5 steps with a railing? : Total 6 Click Score: 6    End of Session   Activity Tolerance: Patient limited by lethargy Patient left: in bed;with call bell/phone within reach;with bed alarm set   PT Visit Diagnosis: Difficulty in walking, not elsewhere classified (R26.2);Muscle weakness (generalized) (M62.81);Other abnormalities of gait and mobility (R26.89)    Time: 0945-1000 PT Time Calculation (min) (ACUTE ONLY): 15 min   Charges:   PT Evaluation $PT Eval High Complexity: 1 High          Windell Norfolk, DPT, PN1   Supplemental Physical Therapist [place]    Pager [phone]  Acute Rehab Office [phone]    

## 2019-03-14 NOTE — Progress Notes (Signed)
PROGRESS NOTE    DRISANA SCHWEICKERT  MVH:[id number] DOB: March 03, 1946 DOA: 03/13/2019 PCP: Leonard Downing, MD     Brief Narrative:  KASHIA BROSSARD is a 73 year old female with past medical history significant for type 2 diabetes, hyperlipidemia, hypertension, GERD presented after being found unresponsive.  She reportedly lives at home alone.  EMS was called to patient's house by her ex-husband after he found her unresponsive. In the emergency department, she was found to be febrile up to 101.1, WBC 13.1. Admitted for sepsis, started on broad spectrum antibiotics.   New events last 24 hours / Subjective: Patient remains with altered mental status. Cannot verbalize any pain, but does appear comfortable on exam.   I discussed with granddaughter in law.  Patient is divorced, has 1 living son who is not involved in her life and currently lives in [place] state.  Grandson and granddaughter in law live close by and see her multiple times a week.  At baseline, patient is fully functional, drives, lives alone at home.  She states that she received a voicemail from the patient Monday night, and at that time patient was speaking normally without any issues or complaints.  Then Tuesday, patient had called granddaughter in law as well as grandson, but was unable to reach either of them.  She then called her ex-husband who also lives in town about 7 or 8 times.  Her ex-husband found that patient was not acting herself, not speaking over the phone.  At that time, ex-husband went to her house, found her to be unresponsive and called EMS. Granddaughter in law also noted that she found an opened feminine pad at the bathroom sink, and appeared as though patient was about to use it. Patient is s/p hysterectomy.   Assessment & Plan:   Principal Problem:   Acute metabolic encephalopathy Active Problems:   Diabetes mellitus type 2, controlled ([place])   Hyperlipidemia   Essential hypertension   AKI (acute kidney  injury) ([place])   Type 2 diabetes mellitus without complication (HCC)   Sepsis ([place])   Diverticulitis   Acute metabolic encephalopathy -?secondary to combination of infection, dehydration, decreased p.o. intake vs stroke -CT head without acute intracranial abnormality -Check MRI brain to rule out stroke   Sepsis secondary to diverticulitis, sepsis present on admission -Patient presented with fever 101.1, leukocytosis, altered mental status -Covid, influenza negative -UA unremarkable -Blood cultures pending -Chest x-ray unremarkable -CT chest, abdomen, pelvis reveals colonic diverticulosis with possible mild acute diverticulitis in the sigmoid colon -Empirically treated with Vanco, cefepime, Flagyl.  De-escalate to Zosyn  Essential hypertension -Hold home lisinopril -Blood pressure remains stable this morning  AKI -Improved with IV fluids.  Continue to hold lisinopril  Type 2 diabetes, well controlled -Hemoglobin A1c 6.8 -SSI  -Hold home Metformin  Hyperlipidemia -Continue lipitor   Hypomagnesemia -Replace, trend   DVT prophylaxis: SCD Code Status: Full code Family Communication: Discussed with granddaughter in law over the phone Disposition Plan: Pending improvement in sepsis, mentation, PO intake. Patient still NPO status due to decrease in mentation from baseline.  MRI brain ordered to rule out stroke.  Will switch to inpatient status at this time.   Consultants:   None  Procedures:   None  Antimicrobials:  Anti-infectives (From admission, onward)   Start     Dose/Rate Route Frequency Ordered Stop   03/14/19 1700  vancomycin (VANCOCIN) 1,250 mg in sodium chloride 0.9 % 250 mL IVPB  Status:  Discontinued     1,250  mg 166.7 mL/hr over 90 Minutes Intravenous Every 24 hours 03/13/19 1539 03/14/19 0724   03/14/19 0900  piperacillin-tazobactam (ZOSYN) IVPB 3.375 g     3.375 g 12.5 mL/hr over 240 Minutes Intravenous Every 8 hours 03/14/19 0740     03/14/19 0400   metroNIDAZOLE (FLAGYL) IVPB 500 mg  Status:  Discontinued     500 mg 100 mL/hr over 60 Minutes Intravenous Every 8 hours 03/13/19 1906 03/14/19 0724   03/13/19 1545  ceFEPIme (MAXIPIME) 2 g in sodium chloride 0.9 % 100 mL IVPB  Status:  Discontinued     2 g 200 mL/hr over 30 Minutes Intravenous  Once 03/13/19 1530 03/13/19 1539   03/13/19 1545  metroNIDAZOLE (FLAGYL) IVPB 500 mg     500 mg 100 mL/hr over 60 Minutes Intravenous  Once 03/13/19 1530 03/13/19 2020   03/13/19 1545  vancomycin (VANCOCIN) IVPB 1000 mg/200 mL premix  Status:  Discontinued     1,000 mg 200 mL/hr over 60 Minutes Intravenous  Once 03/13/19 1530 03/13/19 1534   03/13/19 1545  vancomycin (VANCOCIN) 2,500 mg in sodium chloride 0.9 % 500 mL IVPB     2,500 mg 250 mL/hr over 120 Minutes Intravenous  Once 03/13/19 1534 03/13/19 2020   03/13/19 1545  ceFEPIme (MAXIPIME) 2 g in sodium chloride 0.9 % 100 mL IVPB  Status:  Discontinued     2 g 200 mL/hr over 30 Minutes Intravenous Every 12 hours 03/13/19 1539 03/14/19 0724        Objective: Vitals:   03/14/19 0015 03/14/19 0125 03/14/19 0155 03/14/19 0810  BP: (!) 137/54 (!) 119/46 (!) 144/57 (!) 132/53  Pulse: 80 72 73 65  Resp: (!) Temp:  99 F (37.2 C) 100.3 F (37.9 C) 98.7 F (37.1 C)  TempSrc:  Oral Axillary Oral  SpO2: 99% 98% 96% 98%  Weight:   112.4 kg   Height:        Intake/Output Summary (Last 24 hours) at 03/14/2019 0958 Last data filed at 03/14/2019 0347 Gross per 24 hour  Intake 4219.69 ml  Output --  Net 4219.69 ml   Filed Weights   03/13/19 1358 03/14/19 0155  Weight: 113.4 kg 112.4 kg    Examination:  General exam: Appears calm and comfortable, appears to be confused and does not answer questions readily  Respiratory system: Clear to auscultation. Respiratory effort normal. No respiratory distress. No conversational dyspnea.  Cardiovascular system: S1 & S2 heard, bradycardic, rate 50s. No murmurs. No pedal  edema. Gastrointestinal system: Abdomen is nondistended, soft and nontender to palpation. Normal bowel sounds heard. Central nervous system: Alert voice but does not answer any questions or follows commands Extremities: Symmetric in appearance  Skin: No rashes, lesions or ulcers on exposed skin   Data Reviewed: I have personally reviewed following labs and imaging studies  CBC: Recent Labs  Lab 03/13/19 1410 03/13/19 2108 03/14/19 0542  WBC 13.1*  --  10.5  NEUTROABS 11.6*  --   --   HGB 14.5 12.2 12.6  HCT 44.0 36.0 37.6  MCV 90.7  --  89.7  PLT 350  --  PLATELET CLUMPS NOTED ON SMEAR, UNABLE TO ESTIMATE   Basic Metabolic Panel: Recent Labs  Lab 03/13/19 1410 03/13/19 1900 03/13/19 2108 03/14/19 0542  NA 137  --  139 138  K 4.1  --  3.5 3.6  CL 100  --   --  104  CO2 23  --   --  20*  GLUCOSE 161*  --   --  131*  BUN 11  --   --  8  CREATININE 1.17*  --   --  0.92  CALCIUM 9.1  --   --  7.9*  MG  --  1.7  --  1.3*  PHOS  --   --   --  2.8   GFR: Estimated Creatinine Clearance: 71.6 mL/min (by C-G formula based on SCr of 0.92 mg/dL). Liver Function Tests: Recent Labs  Lab 03/13/19 1410 03/14/19 0542  AST 24 19  ALT 19 14  ALKPHOS 101 73  BILITOT 0.6 0.9  PROT 7.0 5.6*  ALBUMIN 4.0 3.1*   No results for input(s): LIPASE, AMYLASE in the last 168 hours. Recent Labs  Lab 03/13/19 1410  AMMONIA 30   Coagulation Profile: Recent Labs  Lab 03/13/19 1535  INR 0.9   Cardiac Enzymes: Recent Labs  Lab 03/13/19 1900  CKTOTAL 313*   BNP (last 3 results) No results for input(s): PROBNP in the last 8760 hours. HbA1C: Recent Labs    03/14/19 0204  HGBA1C 6.8*   CBG: Recent Labs  Lab 03/13/19 1404 03/13/19 2001 03/14/19 0126 03/14/19 0159 03/14/19 0812  GLUCAP 155* 101* 102* 95 113*   Lipid Profile: Recent Labs    03/13/19 1413  TRIG 57   Thyroid Function Tests: Recent Labs    03/14/19 0542  TSH 0.488   Anemia Panel: Recent Labs     03/13/19 1413  FERRITIN 133   Sepsis Labs: Recent Labs  Lab 03/13/19 1410 03/13/19 1413 03/13/19 1840 03/13/19 2020 03/14/19 0204  PROCALCITON  --  <0.10  --   --   --   LATICACIDVEN 2.7*  --  0.9 1.1 1.4    Recent Results (from the past 240 hour(s))  Respiratory Panel by RT PCR (Flu A&B, Covid) - Nasopharyngeal Swab     Status: None   Collection Time: 03/13/19  5:01 PM   Specimen: Nasopharyngeal Swab  Result Value Ref Range Status   SARS Coronavirus 2 by RT PCR NEGATIVE NEGATIVE Final    Comment: (NOTE) SARS-CoV-2 target nucleic acids are NOT DETECTED. The SARS-CoV-2 RNA is generally detectable in upper respiratoy specimens during the acute phase of infection. The lowest concentration of SARS-CoV-2 viral copies this assay can detect is 131 copies/mL. A negative result does not preclude SARS-Cov-2 infection and should not be used as the sole basis for treatment or other patient management decisions. A negative result may occur with  improper specimen collection/handling, submission of specimen other than nasopharyngeal swab, presence of viral mutation(s) within the areas targeted by this assay, and inadequate number of viral copies (<131 copies/mL). A negative result must be combined with clinical observations, patient history, and epidemiological information. The expected result is Negative. Fact Sheet for Patients:  https://www.moore.com/ Fact Sheet for Healthcare Providers:  https://www.young.biz/ This test is not yet ap proved or cleared by the [place] FDA and  has been authorized for detection and/or diagnosis of SARS-CoV-2 by FDA under an Emergency Use Authorization (EUA). This EUA will remain  in effect (meaning this test can be used) for the duration of the COVID-19 declaration under Section 564(b)(1) of the Act, 21 U.S.C. section 360bbb-3(b)(1), unless the authorization is terminated or revoked sooner.    Influenza A  by PCR NEGATIVE NEGATIVE Final   Influenza B by PCR NEGATIVE NEGATIVE Final    Comment: (NOTE) The Xpert Xpress SARS-CoV-2/FLU/RSV assay is intended as an aid in  the diagnosis of influenza from Nasopharyngeal swab specimens and  should not be used as a sole basis for treatment. Nasal washings and  aspirates are unacceptable for Xpert Xpress SARS-CoV-2/FLU/RSV  testing. Fact Sheet for Patients: https://www.moore.com/ Fact Sheet for Healthcare Providers: https://www.young.biz/ This test is not yet approved or cleared by the [place] FDA and  has been authorized for detection and/or diagnosis of SARS-CoV-2 by  FDA under an Emergency Use Authorization (EUA). This EUA will remain  in effect (meaning this test can be used) for the duration of the  Covid-19 declaration under Section 564(b)(1) of the Act, 21  U.S.C. section 360bbb-3(b)(1), unless the authorization is  terminated or revoked. Performed at [facility], [address], [place], [place] [address]   MRSA PCR Screening     Status: None   Collection Time: 03/14/19  2:08 AM   Specimen: Nasopharyngeal  Result Value Ref Range Status   MRSA by PCR NEGATIVE NEGATIVE Final    Comment:        The GeneXpert MRSA Assay (FDA approved for NASAL specimens only), is one component of a comprehensive MRSA colonization surveillance program. It is not intended to diagnose MRSA infection nor to guide or monitor treatment for MRSA infections. Performed at [facility], [address] [place], [place] [address]       Radiology Studies: CT Head Wo Contrast  Result Date: 03/13/2019 CLINICAL DATA:  Encephalopathy, unresponsive EXAM: CT HEAD WITHOUT CONTRAST TECHNIQUE: Contiguous axial images were obtained from the base of the skull through the vertex without intravenous contrast. COMPARISON:  December 13, 2012 FINDINGS: Brain: No evidence of acute territorial infarction, hemorrhage,  hydrocephalus,extra-axial collection or mass lesion/mass effect. There is mild dilatation the ventricles and sulci consistent with age-related atrophy. Low-attenuation changes in the deep white matter consistent with small vessel ischemia. Vascular: No hyperdense vessel or unexpected calcification. Skull: The skull is intact. No fracture or focal lesion identified. Sinuses/Orbits: The visualized paranasal sinuses and mastoid air cells are clear. The orbits and globes intact. Other: None IMPRESSION: No acute intracranial abnormality. Findings consistent with mild age related atrophy and chronic small vessel ischemia Electronically Signed   By: Jonna Clark M.D.   On: 03/13/2019 16:11   CT Angio Chest PE W and/or Wo Contrast  Result Date: 03/14/2019 CLINICAL DATA:  PE suspected, high prob; Abd pain, acute, generalized Cough. Fever. Altered mental status. EXAM: CT ANGIOGRAPHY CHEST CT ABDOMEN AND PELVIS WITH CONTRAST TECHNIQUE: Multidetector CT imaging of the chest was performed using the standard protocol during bolus administration of intravenous contrast. Multiplanar CT image reconstructions and MIPs were obtained to evaluate the vascular anatomy. Multidetector CT imaging of the abdomen and pelvis was performed using the standard protocol during bolus administration of intravenous contrast. CONTRAST:  OMNIPAQUE IOHEXOL 350 MG/ML SOLN COMPARISON:  Abdominal CT 09/02/2017. FINDINGS: CTA CHEST FINDINGS Cardiovascular: There are no filling defects within the pulmonary arteries to suggest pulmonary embolus. Atherosclerosis of the thoracic aorta without dissection. Heart is normal in size. Coronary artery calcifications versus stents. No pericardial effusion. Mediastinum/Nodes: No enlarged mediastinal or hilar lymph nodes. No suspicious thyroid nodule. No esophageal wall thickening. Lungs/Pleura: Motion artifact and streak artifact from overlying monitoring device partially obscure the bases. No consolidation  to suggest pneumonia. No pleural effusion. No pulmonary mass or suspicious nodule. Musculoskeletal: There are no acute or suspicious osseous abnormalities. Mild degenerative change in the spine. Review of the MIP images confirms the above findings. CT ABDOMEN and PELVIS  FINDINGS Motion and streak artifact partially obscure evaluation. Hepatobiliary: No focal liver abnormality is seen. Postcholecystectomy without biliary dilatation. Pancreas: Fatty atrophy.  No ductal dilatation or inflammation. Spleen: Normal in size. Obscured by artifact. Adrenals/Urinary Tract: No adrenal nodule. No hydronephrosis or perinephric edema. Homogeneous renal enhancement with symmetric excretion on delayed phase imaging. Urinary bladder is decompressed by Foley catheter. Stomach/Bowel: Nondistended stomach. No bowel obstruction or evidence of inflammation. Normal appendix tentatively visualized. No pericecal or right lower quadrant inflammation to suggest appendicitis. Multifocal colonic diverticulosis. Suggestion of mild pericolonic fat stranding and edema adjacent to a diverticula in the sigmoid colon, series 12, image 71. Vascular/Lymphatic: Aorto bi-iliac atherosclerosis. No aneurysm. Small central mesenteric lymph nodes with adjacent mesenteric edema are unchanged from prior exam. No enlarged lymph nodes in the abdomen or pelvis. The portal vein is patent. Reproductive: Post hysterectomy. Ovaries tentatively visualized and normal. No suspicious adnexal mass. Other: No ascites. No free air. No intra-abdominal abscess. Musculoskeletal: There are no acute or suspicious osseous abnormalities. Facet hypertrophy in the lower lumbar spine. Degenerative change in both hips and sacroiliac joints. Review of the MIP images confirms the above findings. IMPRESSION: 1. No pulmonary embolus or acute intrathoracic abnormality. 2. Coronary artery calcifications versus stents. 3. Colonic diverticulosis with possible mild acute diverticulitis in the  sigmoid colon. 4. 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 Aortic Atherosclerosis (ICD10-I70.0). Electronically Signed   By: Narda RutherfordMelanie  Sanford M.D.   On: 03/14/2019 01:46   CT ABDOMEN PELVIS W CONTRAST  Result Date: 03/14/2019 CLINICAL DATA:  PE suspected, high prob; Abd pain, acute, generalized Cough. Fever. Altered mental status. EXAM: CT ANGIOGRAPHY CHEST CT ABDOMEN AND PELVIS WITH CONTRAST TECHNIQUE: Multidetector CT imaging of the chest was performed using the standard protocol during bolus administration of intravenous contrast. Multiplanar CT image reconstructions and MIPs were obtained to evaluate the vascular anatomy. Multidetector CT imaging of the abdomen and pelvis was performed using the standard protocol during bolus administration of intravenous contrast. CONTRAST:  100mL OMNIPAQUE IOHEXOL 350 MG/ML SOLN COMPARISON:  Abdominal CT 09/02/2017. FINDINGS: CTA CHEST FINDINGS Cardiovascular: There are no filling defects within the pulmonary arteries to suggest pulmonary embolus. Atherosclerosis of the thoracic aorta without dissection. Heart is normal in size. Coronary artery calcifications versus stents. No pericardial effusion. Mediastinum/Nodes: No enlarged mediastinal or hilar lymph nodes. No suspicious thyroid nodule. No esophageal wall thickening. Lungs/Pleura: Motion artifact and streak artifact from overlying monitoring device partially obscure the bases. No consolidation to suggest pneumonia. No pleural effusion. No pulmonary mass or suspicious nodule. Musculoskeletal: There are no acute or suspicious osseous abnormalities. Mild degenerative change in the spine. Review of the MIP images confirms the above findings. CT ABDOMEN and PELVIS FINDINGS Motion and streak artifact partially obscure evaluation. Hepatobiliary: No focal liver abnormality is seen. Postcholecystectomy without biliary dilatation. Pancreas: Fatty atrophy.   No ductal dilatation or inflammation. Spleen: Normal in size. Obscured by artifact. Adrenals/Urinary Tract: No adrenal nodule. No hydronephrosis or perinephric edema. Homogeneous renal enhancement with symmetric excretion on delayed phase imaging. Urinary bladder is decompressed by Foley catheter. Stomach/Bowel: Nondistended stomach. No bowel obstruction or evidence of inflammation. Normal appendix tentatively visualized. No pericecal or right lower quadrant inflammation to suggest appendicitis. Multifocal colonic diverticulosis. Suggestion of mild pericolonic fat stranding and edema adjacent to a diverticula in the sigmoid colon, series 12, image 71. Vascular/Lymphatic: Aorto bi-iliac atherosclerosis. No aneurysm. Small central mesenteric lymph nodes with adjacent mesenteric edema are unchanged from prior exam.  No enlarged lymph nodes in the abdomen or pelvis. The portal vein is patent. Reproductive: Post hysterectomy. Ovaries tentatively visualized and normal. No suspicious adnexal mass. Other: No ascites. No free air. No intra-abdominal abscess. Musculoskeletal: There are no acute or suspicious osseous abnormalities. Facet hypertrophy in the lower lumbar spine. Degenerative change in both hips and sacroiliac joints. Review of the MIP images confirms the above findings. IMPRESSION: 1. No pulmonary embolus or acute intrathoracic abnormality. 2. Coronary artery calcifications versus stents. 3. Colonic diverticulosis with possible mild acute diverticulitis in the sigmoid colon. 4. 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 Aortic Atherosclerosis (ICD10-I70.0). Electronically Signed   By: Narda Rutherford M.D.   On: 03/14/2019 01:46   DG Chest Port 1 View  Result Date: 03/13/2019 CLINICAL DATA:  Altered mental status with chest pain and fever EXAM: PORTABLE CHEST 1 VIEW COMPARISON:  Aug 16, 2017 FINDINGS: There is no evident edema or  consolidation. Heart size and pulmonary vascularity are normal. No adenopathy. No pneumothorax. There is degenerative change in the thoracic spine. IMPRESSION: No edema or consolidation.  No adenopathy. Electronically Signed   By: Bretta Bang III M.D.   On: 03/13/2019 14:28      Scheduled Meds: . atorvastatin  40 mg Oral QHS  . Chlorhexidine Gluconate Cloth  6 each Topical Daily  . insulin aspart  0-9 Units Subcutaneous Q4H   Continuous Infusions: . sodium chloride 100 mL/hr at 03/14/19 0255  . piperacillin-tazobactam (ZOSYN)  IV       LOS: 0 days      Time spent: 45 minutes   Noralee Stain, DO [facility] 03/14/2019, 9:58 AM   Available via Epic secure chat 7am-7pm After these hours, please refer to coverage provider listed on [email or web address]

## 2019-03-15 ENCOUNTER — Inpatient Hospital Stay (HOSPITAL_COMMUNITY): Payer: Medicare Other

## 2019-03-15 LAB — BASIC METABOLIC PANEL
Anion gap: 13 (ref 5–15)
BUN: 7 mg/dL — ABNORMAL LOW (ref 8–23)
CO2: 22 mmol/L (ref 22–32)
Calcium: 8.5 mg/dL — ABNORMAL LOW (ref 8.9–10.3)
Chloride: 105 mmol/L (ref 98–111)
Creatinine, Ser: 1 mg/dL (ref 0.44–1.00)
GFR calc Af Amer: >60 mL/min (ref >60)
GFR calc non Af Amer: 56 mL/min — ABNORMAL LOW (ref >60)
Glucose, Bld: 115 mg/dL — ABNORMAL HIGH (ref 70–99)
Potassium: 3.3 mmol/L — ABNORMAL LOW (ref 3.5–5.1)
Sodium: 140 mmol/L (ref 135–145)

## 2019-03-15 LAB — GLUCOSE, CAPILLARY
Glucose-Capillary: 109 mg/dL — ABNORMAL HIGH (ref 70–99)
Glucose-Capillary: 97 mg/dL (ref 70–99)
Glucose-Capillary: 124 mg/dL — ABNORMAL HIGH (ref 70–99)
Glucose-Capillary: 141 mg/dL — ABNORMAL HIGH (ref 70–99)
Glucose-Capillary: 124 mg/dL — ABNORMAL HIGH (ref 70–99)

## 2019-03-15 LAB — CBC
HCT: 38.6 % (ref 36.0–46.0)
Hemoglobin: 12.9 g/dL (ref 12.0–15.0)
MCH: 29.9 pg (ref 26.0–34.0)
MCHC: 33.4 g/dL (ref 30.0–36.0)
MCV: 89.6 fL (ref 80.0–100.0)
Platelets: 289 10*3/uL (ref 150–400)
RBC: 4.31 MIL/uL (ref 3.87–5.11)
RDW: 13 % (ref 11.5–15.5)
WBC: 9.2 10*3/uL (ref 4.0–10.5)
nRBC: 0 % (ref 0.0–0.2)

## 2019-03-15 LAB — MAGNESIUM: Magnesium: 1.8 mg/dL (ref 1.7–2.4)

## 2019-03-15 MED ORDER — POTASSIUM CHLORIDE 10 MEQ/100ML IV SOLN
10.0000 meq | INTRAVENOUS | Status: DC
  Administered 2019-03-15 (×2): 10 meq via INTRAVENOUS
  Filled 2019-03-15 (×2): qty 100

## 2019-03-15 MED ORDER — POTASSIUM CHLORIDE CRYS ER 20 MEQ PO TBCR
20.0000 meq | EXTENDED_RELEASE_TABLET | Freq: Once | ORAL | Status: AC
  Administered 2019-03-15: 20 meq via ORAL
  Filled 2019-03-15: qty 1

## 2019-03-15 NOTE — TOC Initial Note (Addendum)
Transition of Care [facility]) - Initial/Assessment Note    Patient Details  Name: Jasmine Cohen MRN: [id number] Date of Birth: 21-May-1945  Transition of Care [facility]) CM/SW Contact:    Sharin Mons, RN Phone Number: [phone] 03/15/2019, 7:42 AM  Clinical Narrative:                 Pt admitted with AMS, sepsis. Hx of 2 diabetes, hyperlipidemia, hypertension, GERD. Found unresponsive. From home alone.    PT'srecommendation:SNF;Supervision/Assistance - 24 hour;Other (comment)(will update if appropriate as/if cognition and participation improves.  TOC team following for TOC needs ....will f/u with family member listed in epic regarding  TOC needs 2/2 pt's orientation.  Expected Discharge Plan: Skilled Nursing Facility Barriers to Discharge: Continued Medical Work up   Patient Goals and CMS Choice        Expected Discharge Plan and Services Expected Discharge Plan: [facility]                                              Prior Living Arrangements/Services                       Activities of Daily Living   ADL Screening (condition at time of admission) Patient's cognitive ability adequate to safely complete daily activities?: No Does the patient have difficulty concentrating, remembering, or making decisions?: Yes  Permission Sought/Granted                  Emotional Assessment              Admission diagnosis:  SIRS (systemic inflammatory response syndrome) (HCC) [R65.10] Altered mental status, unspecified altered mental status type [R41.82] Sepsis, due to unspecified organism, unspecified whether acute organ dysfunction present ([place]) [E56.3] Acute metabolic encephalopathy [J49.70] Patient Active Problem List   Diagnosis Date Noted  . Sepsis ([place]) 03/14/2019  . Diverticulitis 03/14/2019  . Acute metabolic encephalopathy 26/37/8588  . Essential hypertension 03/13/2019  . AKI (acute kidney injury) ([place]) 03/13/2019  .  Type 2 diabetes mellitus without complication ([place]) 50/27/7412  . Abdominal pain 03/27/2014  . Leucocytosis 03/27/2014  . Diabetes mellitus type 2, controlled ([place]) 03/27/2014  . Hyperlipidemia 03/27/2014  . Fall 11/27/2012  . Avulsed toenail 11/27/2012  . Contusion of left knee 11/27/2012   PCP:  Leonard Downing, MD Pharmacy:   [facility] [place] - [address] [place] ([place]) [place] [address] Phone: [phone] Fax: [phone]     Social Determinants of Health (SDOH) Interventions    Readmission Risk Interventions No flowsheet data found.

## 2019-03-15 NOTE — Progress Notes (Signed)
PROGRESS NOTE    Jasmine Cohen  UJW:[id number] DOB: [birth year] DOA: 03/13/2019 PCP: Kaleen Mask, MD     Brief Narrative:  Jasmine Cohen is a 73 year old female with past medical history significant for type 2 diabetes, hyperlipidemia, hypertension, GERD presented after being found unresponsive.  She reportedly lives at home alone.  EMS was called to patient's house by her ex-husband after he found her unresponsive. In the emergency department, she was found to be febrile up to 101.1, WBC 13.1. Admitted for sepsis, started on broad spectrum antibiotics.   New events last 24 hours / Subjective: Patient's mental status is much improved on examination today.  She is very hard of hearing, but is able to tell me that she is at the hospital.  She is confused regarding the year.  She is in good spirits overall.  Does not recall what happened at home prior to admission.  Assessment & Plan:   Principal Problem:   Acute metabolic encephalopathy Active Problems:   Diabetes mellitus type 2, controlled (HCC)   Hyperlipidemia   Essential hypertension   AKI (acute kidney injury) (HCC)   Type 2 diabetes mellitus without complication (HCC)   Sepsis (HCC)   Diverticulitis   Acute metabolic encephalopathy -?secondary to combination of infection, dehydration, decreased p.o. intake vs stroke -CT head without acute intracranial abnormality -MRI brain negative for acute intracranial abnormality -Improved but not back to baseline  Sepsis secondary to diverticulitis, sepsis present on admission -Patient presented with fever 101.1, leukocytosis, altered mental status -Covid, influenza negative -UA unremarkable, urine culture negative -Blood cultures negative to date -Chest x-ray unremarkable -CT chest, abdomen, pelvis reveals colonic diverticulosis with possible mild acute diverticulitis in the sigmoid colon -Empirically treated with Vanco, cefepime, Flagyl.  De-escalate to  Zosyn  Essential hypertension -Hold home lisinopril -Blood pressure remains stable this morning  AKI -Resolved with IV fluids.  Continue to hold lisinopril  Type 2 diabetes, well controlled -Hemoglobin A1c 6.8 -SSI  -Hold home Metformin  Hyperlipidemia -Continue lipitor   Hypokalemia -Replace, trend    DVT prophylaxis: SCD Code Status: Full code Family Communication: Granddaughter-in-law at bedside Disposition Plan: Pending improvement in sepsis, mentation.  Her mentation has improved but not quite back to baseline yet.  PT OT recommending SNF placement   Consultants:   None  Procedures:   None  Antimicrobials:  Anti-infectives (From admission, onward)   Start     Dose/Rate Route Frequency Ordered Stop   03/14/19 1700  vancomycin (VANCOCIN) 1,250 mg in sodium chloride 0.9 % 250 mL IVPB  Status:  Discontinued     1,250 mg 166.7 mL/hr over 90 Minutes Intravenous Every 24 hours 03/13/19 1539 03/14/19 0724   03/14/19 0900  piperacillin-tazobactam (ZOSYN) IVPB 3.375 g     3.375 g 12.5 mL/hr over 240 Minutes Intravenous Every 8 hours 03/14/19 0740     03/14/19 0400  metroNIDAZOLE (FLAGYL) IVPB 500 mg  Status:  Discontinued     500 mg 100 mL/hr over 60 Minutes Intravenous Every 8 hours 03/13/19 1906 03/14/19 0724   03/13/19 1545  ceFEPIme (MAXIPIME) 2 g in sodium chloride 0.9 % 100 mL IVPB  Status:  Discontinued     2 g 200 mL/hr over 30 Minutes Intravenous  Once 03/13/19 1530 03/13/19 1539   03/13/19 1545  metroNIDAZOLE (FLAGYL) IVPB 500 mg     500 mg 100 mL/hr over 60 Minutes Intravenous  Once 03/13/19 1530 03/13/19 2020   03/13/19 1545  vancomycin (VANCOCIN) IVPB 1000  mg/200 mL premix  Status:  Discontinued     1,000 mg 200 mL/hr over 60 Minutes Intravenous  Once 03/13/19 1530 03/13/19 1534   03/13/19 1545  vancomycin (VANCOCIN) 2,500 mg in sodium chloride 0.9 % 500 mL IVPB     2,500 mg 250 mL/hr over 120 Minutes Intravenous  Once 03/13/19 1534 03/13/19 2020    03/13/19 1545  ceFEPIme (MAXIPIME) 2 g in sodium chloride 0.9 % 100 mL IVPB  Status:  Discontinued     2 g 200 mL/hr over 30 Minutes Intravenous Every 12 hours 03/13/19 1539 03/14/19 0724       Objective: Vitals:   03/14/19 1131 03/14/19 1555 03/14/19 2330 03/15/19 0805  BP: (!) 131/45 (!) 138/52 (!) 140/55 (!) 141/66  Pulse: 68 (!) 58 60 (!) 57  Resp: 17 17 18  (!) 24  Temp: 98.4 F (36.9 C) 98.3 F (36.8 C) 98 F (36.7 C) 97.9 F (36.6 C)  TempSrc: Axillary Axillary Oral Oral  SpO2: 97% 100% 99% 99%  Weight:      Height:        Intake/Output Summary (Last 24 hours) at 03/15/2019 1248 Last data filed at 03/15/2019 0542 Gross per 24 hour  Intake --  Output 1800 ml  Net -1800 ml   Filed Weights   03/13/19 1358 03/14/19 0155  Weight: 113.4 kg 112.4 kg    Examination: General exam: Appears calm and comfortable, hard of hearing Respiratory system: Clear to auscultation. Respiratory effort normal. Cardiovascular system: S1 & S2 heard, bradycardic, regular rhythm. No pedal edema. Gastrointestinal system: Abdomen is nondistended, soft and nontender. Normal bowel sounds heard. Central nervous system: Alert and oriented to self and place only.  Speech clear Extremities: Symmetric in appearance bilaterally  Skin: No rashes, lesions or ulcers on exposed skin  Psychiatry: Mild confusion   Data Reviewed: I have personally reviewed following labs and imaging studies  CBC: Recent Labs  Lab 03/13/19 1410 03/13/19 2108 03/14/19 0542 03/15/19 0319  WBC 13.1*  --  10.5 9.2  NEUTROABS 11.6*  --   --   --   HGB 14.5 12.2 12.6 12.9  HCT 44.0 36.0 37.6 38.6  MCV 90.7  --  89.7 89.6  PLT 350  --  PLATELET CLUMPS NOTED ON SMEAR, UNABLE TO ESTIMATE 289   Basic Metabolic Panel: Recent Labs  Lab 03/13/19 1410 03/13/19 1900 03/13/19 2108 03/14/19 0542 03/15/19 0319  NA 137  --  139 138 140  K 4.1  --  3.5 3.6 3.3*  CL 100  --   --  104 105  CO2 23  --   --  20* 22   GLUCOSE 161*  --   --  131* 115*  BUN 11  --   --  8 7*  CREATININE 1.17*  --   --  0.92 1.00  CALCIUM 9.1  --   --  7.9* 8.5*  MG  --  1.7  --  1.3* 1.8  PHOS  --   --   --  2.8  --    GFR: Estimated Creatinine Clearance: 65.9 mL/min (by C-G formula based on SCr of 1 mg/dL). Liver Function Tests: Recent Labs  Lab 03/13/19 1410 03/14/19 0542  AST 24 19  ALT 19 14  ALKPHOS 101 73  BILITOT 0.6 0.9  PROT 7.0 5.6*  ALBUMIN 4.0 3.1*   No results for input(s): LIPASE, AMYLASE in the last 168 hours. Recent Labs  Lab 03/13/19 1410  AMMONIA 30  Coagulation Profile: Recent Labs  Lab 03/13/19 1535  INR 0.9   Cardiac Enzymes: Recent Labs  Lab 03/13/19 1900  CKTOTAL 313*   BNP (last 3 results) No results for input(s): PROBNP in the last 8760 hours. HbA1C: Recent Labs    03/14/19 0204  HGBA1C 6.8*   CBG: Recent Labs  Lab 03/14/19 2000 03/14/19 2334 03/15/19 0539 03/15/19 0808 03/15/19 1126  GLUCAP 108* 112* 109* 97 124*   Lipid Profile: Recent Labs    03/13/19 1413  TRIG 57   Thyroid Function Tests: Recent Labs    03/14/19 0542  TSH 0.488   Anemia Panel: Recent Labs    03/13/19 1413  FERRITIN 133   Sepsis Labs: Recent Labs  Lab 03/13/19 1410 03/13/19 1413 03/13/19 1840 03/13/19 2020 03/14/19 0204  PROCALCITON  --  <0.10  --   --   --   LATICACIDVEN 2.7*  --  0.9 1.1 1.4    Recent Results (from the past 240 hour(s))  Culture, blood (routine x 2)     Status: None (Preliminary result)   Collection Time: 03/13/19  3:04 PM   Specimen: BLOOD  Result Value Ref Range Status   Specimen Description BLOOD RIGHT ANTECUBITAL  Final   Special Requests   Final    BOTTLES DRAWN AEROBIC AND ANAEROBIC Blood Culture adequate volume   Culture   Final    NO GROWTH 2 DAYS Performed at [facility], [address] [place], [place] [address]    Report Status PENDING  Incomplete  Culture, blood (routine x 2)     Status: None (Preliminary result)    Collection Time: 03/13/19  5:00 PM   Specimen: BLOOD RIGHT HAND  Result Value Ref Range Status   Specimen Description BLOOD RIGHT HAND  Final   Special Requests   Final    BOTTLES DRAWN AEROBIC AND ANAEROBIC Blood Culture adequate volume   Culture   Final    NO GROWTH 2 DAYS Performed at [facility], [address], [place], [place] [address]    Report Status PENDING  Incomplete  Respiratory Panel by RT PCR (Flu A&B, Covid) - Nasopharyngeal Swab     Status: None   Collection Time: 03/13/19  5:01 PM   Specimen: Nasopharyngeal Swab  Result Value Ref Range Status   SARS Coronavirus 2 by RT PCR NEGATIVE NEGATIVE Final    Comment: (NOTE) SARS-CoV-2 target nucleic acids are NOT DETECTED. The SARS-CoV-2 RNA is generally detectable in upper respiratoy specimens during the acute phase of infection. The lowest concentration of SARS-CoV-2 viral copies this assay can detect is 131 copies/mL. A negative result does not preclude SARS-Cov-2 infection and should not be used as the sole basis for treatment or other patient management decisions. A negative result may occur with  improper specimen collection/handling, submission of specimen other than nasopharyngeal swab, presence of viral mutation(s) within the areas targeted by this assay, and inadequate number of viral copies (<131 copies/mL). A negative result must be combined with clinical observations, patient history, and epidemiological information. The expected result is Negative. Fact Sheet for Patients:  https://www.moore.com/ Fact Sheet for Healthcare Providers:  https://www.young.biz/ This test is not yet ap proved or cleared by the [place] FDA and  has been authorized for detection and/or diagnosis of SARS-CoV-2 by FDA under an Emergency Use Authorization (EUA). This EUA will remain  in effect (meaning this test can be used) for the duration of the COVID-19 declaration under  Section 564(b)(1)  of the Act, 21 U.S.C. section 360bbb-3(b)(1), unless the authorization is terminated or revoked sooner.    Influenza A by PCR NEGATIVE NEGATIVE Final   Influenza B by PCR NEGATIVE NEGATIVE Final    Comment: (NOTE) The Xpert Xpress SARS-CoV-2/FLU/RSV assay is intended as an aid in  the diagnosis of influenza from Nasopharyngeal swab specimens and  should not be used as a sole basis for treatment. Nasal washings and  aspirates are unacceptable for Xpert Xpress SARS-CoV-2/FLU/RSV  testing. Fact Sheet for Patients: PinkCheek.be Fact Sheet for Healthcare Providers: GravelBags.it This test is not yet approved or cleared by the [place] FDA and  has been authorized for detection and/or diagnosis of SARS-CoV-2 by  FDA under an Emergency Use Authorization (EUA). This EUA will remain  in effect (meaning this test can be used) for the duration of the  Covid-19 declaration under Section 564(b)(1) of the Act, 21  U.S.C. section 360bbb-3(b)(1), unless the authorization is  terminated or revoked. Performed at [facility], [place] [address] [place], [place] [address]   Urine culture     Status: None   Collection Time: 03/13/19  8:37 PM   Specimen: Urine, Random  Result Value Ref Range Status   Specimen Description URINE, RANDOM  Final   Special Requests NONE  Final   Culture   Final    NO GROWTH Performed at [facility], [place] [address], [place], [place] [address]    Report Status 03/14/2019 FINAL  Final  MRSA PCR Screening     Status: None   Collection Time: 03/14/19  2:08 AM   Specimen: Nasopharyngeal  Result Value Ref Range Status   MRSA by PCR NEGATIVE NEGATIVE Final    Comment:        The GeneXpert MRSA Assay (FDA approved for NASAL specimens only), is one component of a comprehensive MRSA colonization surveillance program. It is not intended to diagnose MRSA infection nor to guide  or monitor treatment for MRSA infections. Performed at [facility], [place] [address], [place], [place] [address]       Radiology Studies: CT Head Wo Contrast  Result Date: 03/13/2019 CLINICAL DATA:  Encephalopathy, unresponsive EXAM: CT HEAD WITHOUT CONTRAST TECHNIQUE: Contiguous axial images were obtained from the base of the skull through the vertex without intravenous contrast. COMPARISON:  December 13, 2012 FINDINGS: Brain: No evidence of acute territorial infarction, hemorrhage, hydrocephalus,extra-axial collection or mass lesion/mass effect. There is mild dilatation the ventricles and sulci consistent with age-related atrophy. Low-attenuation changes in the deep white matter consistent with small vessel ischemia. Vascular: No hyperdense vessel or unexpected calcification. Skull: The skull is intact. No fracture or focal lesion identified. Sinuses/Orbits: The visualized paranasal sinuses and mastoid air cells are clear. The orbits and globes intact. Other: None IMPRESSION: No acute intracranial abnormality. Findings consistent with mild age related atrophy and chronic small vessel ischemia Electronically Signed   By: Prudencio Pair M.D.   On: 03/13/2019 16:11   CT Angio Chest PE W and/or Wo Contrast  Result Date: 03/14/2019 CLINICAL DATA:  PE suspected, high prob; Abd pain, acute, generalized Cough. Fever. Altered mental status. EXAM: CT ANGIOGRAPHY CHEST CT ABDOMEN AND PELVIS WITH CONTRAST TECHNIQUE: Multidetector CT imaging of the chest was performed using the standard protocol during bolus administration of intravenous contrast. Multiplanar CT image reconstructions and MIPs were obtained to evaluate the vascular anatomy. Multidetector CT imaging of the abdomen and pelvis was performed using the standard protocol during bolus administration  of intravenous contrast. CONTRAST:  OMNIPAQUE IOHEXOL 350 MG/ML SOLN COMPARISON:  Abdominal CT 09/02/2017. FINDINGS: CTA CHEST FINDINGS  Cardiovascular: There are no filling defects within the pulmonary arteries to suggest pulmonary embolus. Atherosclerosis of the thoracic aorta without dissection. Heart is normal in size. Coronary artery calcifications versus stents. No pericardial effusion. Mediastinum/Nodes: No enlarged mediastinal or hilar lymph nodes. No suspicious thyroid nodule. No esophageal wall thickening. Lungs/Pleura: Motion artifact and streak artifact from overlying monitoring device partially obscure the bases. No consolidation to suggest pneumonia. No pleural effusion. No pulmonary mass or suspicious nodule. Musculoskeletal: There are no acute or suspicious osseous abnormalities. Mild degenerative change in the spine. Review of the MIP images confirms the above findings. CT ABDOMEN and PELVIS FINDINGS Motion and streak artifact partially obscure evaluation. Hepatobiliary: No focal liver abnormality is seen. Postcholecystectomy without biliary dilatation. Pancreas: Fatty atrophy.  No ductal dilatation or inflammation. Spleen: Normal in size. Obscured by artifact. Adrenals/Urinary Tract: No adrenal nodule. No hydronephrosis or perinephric edema. Homogeneous renal enhancement with symmetric excretion on delayed phase imaging. Urinary bladder is decompressed by Foley catheter. Stomach/Bowel: Nondistended stomach. No bowel obstruction or evidence of inflammation. Normal appendix tentatively visualized. No pericecal or right lower quadrant inflammation to suggest appendicitis. Multifocal colonic diverticulosis. Suggestion of mild pericolonic fat stranding and edema adjacent to a diverticula in the sigmoid colon, series 12, image 71. Vascular/Lymphatic: Aorto bi-iliac atherosclerosis. No aneurysm. Small central mesenteric lymph nodes with adjacent mesenteric edema are unchanged from prior exam. No enlarged lymph nodes in the abdomen or pelvis. The portal vein is patent. Reproductive: Post hysterectomy. Ovaries tentatively visualized and  normal. No suspicious adnexal mass. Other: No ascites. No free air. No intra-abdominal abscess. Musculoskeletal: There are no acute or suspicious osseous abnormalities. Facet hypertrophy in the lower lumbar spine. Degenerative change in both hips and sacroiliac joints. Review of the MIP images confirms the above findings. IMPRESSION: 1. No pulmonary embolus or acute intrathoracic abnormality. 2. Coronary artery calcifications versus stents. 3. Colonic diverticulosis with possible mild acute diverticulitis in the sigmoid colon. 4. 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 Aortic Atherosclerosis (ICD10-I70.0). Electronically Signed   By: Narda Rutherford M.D.   On: 03/14/2019 01:46   MR BRAIN WO CONTRAST  Result Date: 03/15/2019 CLINICAL DATA:  Encephalopathy. EXAM: MRI HEAD WITHOUT CONTRAST TECHNIQUE: Multiplanar, multiecho pulse sequences of the brain and surrounding structures were obtained without intravenous contrast. COMPARISON:  Head CT examinations 03/13/2019 and earlier, brain MRI 02/16/2010 FINDINGS: Brain: Multiple sequences are mildly motion degraded. There is no evidence of acute infarct. No evidence of intracranial mass. No midline shift or extra-axial fluid collection. No chronic intracranial blood products. There are few scattered punctate foci of T2/FLAIR hyperintensity within the cerebral white matter, not uncommonly seen in patients of this age. Mild generalized parenchymal atrophy. Vascular: Flow voids maintained within the proximal large arterial vessels. Skull and upper cervical spine: No focal marrow lesion. Sinuses/Orbits: Visualized orbits demonstrate no acute abnormality. Minimal ethmoid sinus mucosal thickening. Trace fluid within left mastoid air cells. IMPRESSION: Mildly motion degraded examination. No evidence of acute intracranial abnormality. Mild generalized parenchymal atrophy. Electronically Signed   By: Jackey Loge DO   On: 03/15/2019 11:46   CT ABDOMEN PELVIS W CONTRAST  Result Date: 03/14/2019 CLINICAL DATA:  PE suspected, high prob; Abd pain, acute, generalized Cough. Fever. Altered mental status. EXAM: CT ANGIOGRAPHY CHEST CT ABDOMEN AND PELVIS WITH CONTRAST TECHNIQUE: Multidetector CT imaging of the  chest was performed using the standard protocol during bolus administration of intravenous contrast. Multiplanar CT image reconstructions and MIPs were obtained to evaluate the vascular anatomy. Multidetector CT imaging of the abdomen and pelvis was performed using the standard protocol during bolus administration of intravenous contrast. CONTRAST:  100mL OMNIPAQUE IOHEXOL 350 MG/ML SOLN COMPARISON:  Abdominal CT 09/02/2017. FINDINGS: CTA CHEST FINDINGS Cardiovascular: There are no filling defects within the pulmonary arteries to suggest pulmonary embolus. Atherosclerosis of the thoracic aorta without dissection. Heart is normal in size. Coronary artery calcifications versus stents. No pericardial effusion. Mediastinum/Nodes: No enlarged mediastinal or hilar lymph nodes. No suspicious thyroid nodule. No esophageal wall thickening. Lungs/Pleura: Motion artifact and streak artifact from overlying monitoring device partially obscure the bases. No consolidation to suggest pneumonia. No pleural effusion. No pulmonary mass or suspicious nodule. Musculoskeletal: There are no acute or suspicious osseous abnormalities. Mild degenerative change in the spine. Review of the MIP images confirms the above findings. CT ABDOMEN and PELVIS FINDINGS Motion and streak artifact partially obscure evaluation. Hepatobiliary: No focal liver abnormality is seen. Postcholecystectomy without biliary dilatation. Pancreas: Fatty atrophy.  No ductal dilatation or inflammation. Spleen: Normal in size. Obscured by artifact. Adrenals/Urinary Tract: No adrenal nodule. No hydronephrosis or perinephric edema. Homogeneous renal enhancement with  symmetric excretion on delayed phase imaging. Urinary bladder is decompressed by Foley catheter. Stomach/Bowel: Nondistended stomach. No bowel obstruction or evidence of inflammation. Normal appendix tentatively visualized. No pericecal or right lower quadrant inflammation to suggest appendicitis. Multifocal colonic diverticulosis. Suggestion of mild pericolonic fat stranding and edema adjacent to a diverticula in the sigmoid colon, series 12, image 71. Vascular/Lymphatic: Aorto bi-iliac atherosclerosis. No aneurysm. Small central mesenteric lymph nodes with adjacent mesenteric edema are unchanged from prior exam. No enlarged lymph nodes in the abdomen or pelvis. The portal vein is patent. Reproductive: Post hysterectomy. Ovaries tentatively visualized and normal. No suspicious adnexal mass. Other: No ascites. No free air. No intra-abdominal abscess. Musculoskeletal: There are no acute or suspicious osseous abnormalities. Facet hypertrophy in the lower lumbar spine. Degenerative change in both hips and sacroiliac joints. Review of the MIP images confirms the above findings. IMPRESSION: 1. No pulmonary embolus or acute intrathoracic abnormality. 2. Coronary artery calcifications versus stents. 3. Colonic diverticulosis with possible mild acute diverticulitis in the sigmoid colon. 4. 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 Aortic Atherosclerosis (ICD10-I70.0). Electronically Signed   By: Narda RutherfordMelanie  Sanford M.D.   On: 03/14/2019 01:46   DG Chest Port 1 View  Result Date: 03/13/2019 CLINICAL DATA:  Altered mental status with chest pain and fever EXAM: PORTABLE CHEST 1 VIEW COMPARISON:  Aug 16, 2017 FINDINGS: There is no evident edema or consolidation. Heart size and pulmonary vascularity are normal. No adenopathy. No pneumothorax. There is degenerative change in the thoracic spine. IMPRESSION: No edema or consolidation.  No adenopathy.  Electronically Signed   By: Bretta BangWilliam  Woodruff III M.D.   On: 03/13/2019 14:28      Scheduled Meds: . atorvastatin  40 mg Oral QHS  . Chlorhexidine Gluconate Cloth  6 each Topical Daily  . insulin aspart  0-9 Units Subcutaneous Q4H  . potassium chloride  20 mEq Oral Once   Continuous Infusions: . piperacillin-tazobactam (ZOSYN)  IV 3.375 g (03/15/19 0535)     LOS: 1 day      Time spent: 25 minutes   Noralee StainJennifer Natayla Cadenhead, DO [facility] 03/15/2019, 12:48 PM   Available via Epic secure chat 7am-7pm After  these hours, please refer to coverage provider listed on [email or web address]

## 2019-03-15 NOTE — Evaluation (Signed)
Occupational Therapy Evaluation Patient Details Name: Jasmine Cohen MRN: [id number] DOB: 05/09/1945 Today's Date: 03/15/2019    History of Present Illness 73yo female last seen normal by neighbor at unknown time on 12/14, now found unresponsive. Suspected to have infection of unknown source with high probability for sepsis. Covid negative at admit. Head CT and CTA negative. PMH HTN, DM, HLD. Pt MRI reads: Mildly motion degraded examination. No evidence of acute intracranial abnormality.   Clinical Impression   Patient is a 73 year old female that lives alone in a trailer with ramp entrance. At baseline patient if independent with self care, IADLs and intermittent use of AD walker vs cane. Currently patient displays cognitive deficits re: safety awareness, insight to deficits, sequencing, orientation. Patient also having expressive/word finding difficulties with some slurred speech. Patient currently min A for functional ambulation using rolling walker, requires cues for safety with body mechanics. Recommend continued acute OT services to maximize patient safety and independence with self care.     Follow Up Recommendations  CIR;Supervision/Assistance - 24 hour    Equipment Recommendations  Other (comment)(to be determined)    Recommendations for Other Services Rehab consult     Precautions / Restrictions Precautions Precautions: Fall Precaution Comments: hx of L foot pain, HOH Restrictions Weight Bearing Restrictions: No      Mobility Bed Mobility Overal bed mobility: Needs Assistance Bed Mobility: Supine to Sit;Sit to Supine     Supine to sit: HOB elevated;Min guard Sit to supine: Min guard   General bed mobility comments: min guard for safety  Transfers Overall transfer level: Needs assistance Equipment used: Rolling walker (2 wheeled) Transfers: Sit to/from Stand Sit to Stand: Min assist         General transfer comment: verbal cues for safety with body  mechanics    Balance Overall balance assessment: Needs assistance Sitting-balance support: Feet supported;No upper extremity supported Sitting balance-Leahy Scale: Fair     Standing balance support: Bilateral upper extremity supported;During functional activity Standing balance-Leahy Scale: Poor Standing balance comment: reliant on B UE external support                           ADL either performed or assessed with clinical judgement   ADL Overall ADL's : Needs assistance/impaired     Grooming: Set up;Sitting   Upper Body Bathing: Set up;Sitting   Lower Body Bathing: Minimal assistance;Sitting/lateral leans;Sit to/from stand   Upper Body Dressing : Set up;Sitting   Lower Body Dressing: Minimal assistance;Sitting/lateral leans;Sit to/from stand   Toilet Transfer: Minimal assistance;RW;Regular Toilet;Cueing for safety;Cueing for sequencing Toilet Transfer Details (indicate cue type and reason): simulated with functional mobility, verbal cues for safety with body mechanics Toileting- Clothing Manipulation and Hygiene: Minimal assistance;Sitting/lateral lean;Sit to/from stand       Functional mobility during ADLs: Minimal assistance;Rolling walker;Cueing for safety       Vision Baseline Vision/History: No visual deficits Patient Visual Report: No change from baseline Vision Assessment?: Yes Eye Alignment: Within Functional Limits Ocular Range of Motion: Within Functional Limits Alignment/Gaze Preference: Within Defined Limits Tracking/Visual Pursuits: Able to track stimulus in all quads without difficulty Depth Perception: (intact) Additional Comments: pt far sighted vision appear intact, able to read signs in room        Praxis Praxis Praxis tested?: Deficits Deficits: Organization Praxis-Other Comments: noted difficulty with finger opposition with tactile, visual, verbal cues    Pertinent Vitals/Pain Pain Assessment: No/denies pain  Hand  Dominance Right   Extremity/Trunk Assessment Upper Extremity Assessment Upper Extremity Assessment: Generalized weakness   Lower Extremity Assessment Lower Extremity Assessment: Defer to PT evaluation   Cervical / Trunk Assessment Cervical / Trunk Assessment: Normal   Communication Communication Communication: HOH;Expressive difficulties   Cognition Arousal/Alertness: Awake/alert Behavior During Therapy: WFL for tasks assessed/performed Overall Cognitive Status: Impaired/Different from baseline Area of Impairment: Orientation;Memory;Following commands;Safety/judgement;Awareness;Problem solving                 Orientation Level: Disoriented to;Situation;Time   Memory: Decreased short-term memory Following Commands: Follows one step commands with increased time Safety/Judgement: Decreased awareness of safety;Decreased awareness of deficits Awareness: Intellectual Problem Solving: Slow processing;Requires verbal cues General Comments: difficulty stating birthday initially stating its the 4th and current year is 63. patient is very hard of hearing and does not have hearing aids + expressive/word finding difficulties making it difficult to fully assess cognition. pt's grandDTR in law states pt is still below her baseline cognitively.              Home Living Family/patient expects to be discharged to:: Private residence Living Arrangements: Alone Available Help at Discharge: Family;Available PRN/intermittently Type of Home: Mobile home Home Access: Ramped entrance     Home Layout: One level     Bathroom Shower/Tub: Teacher, early years/pre: Standard     Home Equipment: Environmental consultant - 4 wheels;Cane - single point   Additional Comments: grandDTR in law states pt doesn't always use walker but should      Prior Functioning/Environment Level of Independence: Independent with assistive device(s)        Comments: patient drives, I ADL/IADLs         OT  Problem List: Decreased strength;Decreased activity tolerance;Impaired balance (sitting and/or standing);Decreased safety awareness;Decreased cognition;Decreased coordination;Decreased knowledge of use of DME or AE      OT Treatment/Interventions: Self-care/ADL training;Therapeutic exercise;Neuromuscular education;DME and/or AE instruction;Therapeutic activities;Cognitive remediation/compensation;Patient/family education;Balance training;Visual/perceptual remediation/compensation    OT Goals(Current goals can be found in the care plan section) Acute Rehab OT Goals Patient Stated Goal: "to go home" OT Goal Formulation: With patient/family Time For Goal Achievement: 03/29/19 Potential to Achieve Goals: Good  OT Frequency: Min 2X/week    AM-PAC OT "6 Clicks" Daily Activity     Outcome Measure Help from another person eating meals?: None Help from another person taking care of personal grooming?: A Little Help from another person toileting, which includes using toliet, bedpan, or urinal?: A Little Help from another person bathing (including washing, rinsing, drying)?: A Little Help from another person to put on and taking off regular upper body clothing?: A Little Help from another person to put on and taking off regular lower body clothing?: A Little 6 Click Score: 19   End of Session Equipment Utilized During Treatment: Rolling walker Nurse Communication: Mobility status  Activity Tolerance: Patient tolerated treatment well Patient left: in bed;with bed alarm set;with call bell/phone within reach;with family/visitor present  OT Visit Diagnosis: Unsteadiness on feet (R26.81);Muscle weakness (generalized) (M62.81);Other symptoms and signs involving cognitive function                Time: 6761-9509 OT Time Calculation (min): 43 min Charges:  OT General Charges $OT Visit: 1 Visit OT Evaluation $OT Eval Moderate Complexity: 1 Mod OT Treatments $Self Care/Home Management : 23-37  mins  Shon Millet OT OT office: [place] 03/15/2019, 1:35 PM

## 2019-03-15 NOTE — Progress Notes (Signed)
Rehab Admissions Coordinator Note:  Patient was screened by Michel Santee for appropriateness for an Inpatient Acute Rehab Consult.  At this time, we are recommending Inpatient Rehab consult.  I will place an order.  Michel Santee, PT, DPT 03/15/2019, 2:04 PM  I can be reached at [phone].

## 2019-03-15 NOTE — Evaluation (Addendum)
Clinical/Bedside Swallow Evaluation Patient Details  Name: Jasmine Cohen MRN: [id number] Date of Birth: May 26, 1945  Today's Date: 03/15/2019 Time: SLP Start Time (ACUTE ONLY): 0900 SLP Stop Time (ACUTE ONLY): 0913 SLP Time Calculation (min) (ACUTE ONLY): 13 min  Past Medical History:  Past Medical History:  Diagnosis Date  . Diabetes mellitus without complication ([place])   . GERD (gastroesophageal reflux disease)   . Hypertension    Past Surgical History:  Past Surgical History:  Procedure Laterality Date  . ABDOMINAL HYSTERECTOMY    . CHOLECYSTECTOMY     HPI:  Jasmine Cohen is a 73 year old female with past medical history significant for type 2 diabetes, hyperlipidemia, hypertension, GERD presented after being found unresponsive. Per MD note patient is fully functional, drives, lives alone at home. CT Findings consistent with mild age related atrophy and chronic small   Assessment / Plan / Recommendation Clinical Impression  Bedside swallow assessment was unremarkabe for impairments. Oral timing, coordination WFL on observation. Alert, strong vocal quality and cough at baseline. She did require approximately 5 additional seconds to process and respond to most questions. From objective view, pharyngeal swallow appeared timely, no s/s aspiration with sips water. She could not consecutively consume more than several sips likely from respiratory standpoint although baseline work of breathing is stable. Recommend regular texture, thin liquids, straws allowed and pills with thin. No follow up needed.  Suspect her confusion should improve. If it does not or MRI is + for stroke and feed she needs cognitive therapy, then please reconsult  SLP Visit Diagnosis: Dysphagia, unspecified (R13.10)    Aspiration Risk  Mild aspiration risk    Diet Recommendation Regular;Thin liquid   Liquid Administration via: Straw;Cup Medication Administration: Whole meds with liquid Supervision: Patient able  to self feed Compensations: Slow rate;Small sips/bites;Minimize environmental distractions Postural Changes: Seated upright at 90 degrees    Other  Recommendations Oral Care Recommendations: Oral care BID   Follow up Recommendations None      Frequency and Duration            Prognosis        Swallow Study   General HPI: Jasmine Cohen is a 73 year old female with past medical history significant for type 2 diabetes, hyperlipidemia, hypertension, GERD presented after being found unresponsive. Per MD note patient is fully functional, drives, lives alone at home. CT Findings consistent with mild age related atrophy and chronic small Type of Study: Bedside Swallow Evaluation Previous Swallow Assessment: (none) Diet Prior to this Study: NPO Temperature Spikes Noted: No Respiratory Status: Room air History of Recent Intubation: No Behavior/Cognition: Alert;Cooperative;Pleasant mood;Requires cueing Oral Cavity Assessment: Within Functional Limits Oral Care Completed by SLP: No Oral Cavity - Dentition: Adequate natural dentition Vision: Functional for self-feeding Self-Feeding Abilities: Able to feed self;Needs set up Patient Positioning: Upright in bed Baseline Vocal Quality: Normal Volitional Cough: Strong Volitional Swallow: Able to elicit    Oral/Motor/Sensory Function Overall Oral Motor/Sensory Function: Within functional limits   Ice Chips Ice chips: Not tested   Thin Liquid Thin Liquid: Within functional limits Presentation: Cup;Straw    Nectar Thick Nectar Thick Liquid: Not tested   Honey Thick Honey Thick Liquid: Not tested   Puree Puree: Within functional limits   Solid     Solid: Within functional limits      Houston Siren 03/15/2019,9:28 AM  Orbie Pyo Colvin Caroli.Ed Risk analyst [phone] Office [phone]

## 2019-03-16 LAB — GLUCOSE, CAPILLARY
Glucose-Capillary: 106 mg/dL — ABNORMAL HIGH (ref 70–99)
Glucose-Capillary: 97 mg/dL (ref 70–99)
Glucose-Capillary: 104 mg/dL — ABNORMAL HIGH (ref 70–99)
Glucose-Capillary: 123 mg/dL — ABNORMAL HIGH (ref 70–99)
Glucose-Capillary: 153 mg/dL — ABNORMAL HIGH (ref 70–99)
Glucose-Capillary: 208 mg/dL — ABNORMAL HIGH (ref 70–99)
Glucose-Capillary: 163 mg/dL — ABNORMAL HIGH (ref 70–99)
Glucose-Capillary: 90 mg/dL (ref 70–99)

## 2019-03-16 LAB — CBC
HCT: 38.3 % (ref 36.0–46.0)
Hemoglobin: 13 g/dL (ref 12.0–15.0)
MCH: 29.8 pg (ref 26.0–34.0)
MCHC: 33.9 g/dL (ref 30.0–36.0)
MCV: 87.8 fL (ref 80.0–100.0)
Platelets: 307 10*3/uL (ref 150–400)
RBC: 4.36 MIL/uL (ref 3.87–5.11)
RDW: 13 % (ref 11.5–15.5)
WBC: 10.1 10*3/uL (ref 4.0–10.5)
nRBC: 0 % (ref 0.0–0.2)

## 2019-03-16 LAB — BASIC METABOLIC PANEL
Anion gap: 10 (ref 5–15)
BUN: 14 mg/dL (ref 8–23)
CO2: 24 mmol/L (ref 22–32)
Calcium: 8.5 mg/dL — ABNORMAL LOW (ref 8.9–10.3)
Chloride: 107 mmol/L (ref 98–111)
Creatinine, Ser: 1.09 mg/dL — ABNORMAL HIGH (ref 0.44–1.00)
GFR calc Af Amer: 58 mL/min — ABNORMAL LOW (ref >60)
GFR calc non Af Amer: 50 mL/min — ABNORMAL LOW (ref >60)
Glucose, Bld: 106 mg/dL — ABNORMAL HIGH (ref 70–99)
Potassium: 3.3 mmol/L — ABNORMAL LOW (ref 3.5–5.1)
Sodium: 141 mmol/L (ref 135–145)

## 2019-03-16 LAB — MAGNESIUM: Magnesium: 1.9 mg/dL (ref 1.7–2.4)

## 2019-03-16 MED ORDER — POTASSIUM CHLORIDE CRYS ER 20 MEQ PO TBCR
40.0000 meq | EXTENDED_RELEASE_TABLET | Freq: Once | ORAL | Status: AC
  Administered 2019-03-16: 40 meq via ORAL
  Filled 2019-03-16: qty 2

## 2019-03-16 MED ORDER — AMOXICILLIN-POT CLAVULANATE 875-125 MG PO TABS
1.0000 | ORAL_TABLET | Freq: Two times a day (BID) | ORAL | Status: DC
  Administered 2019-03-16 – 2019-03-17 (×3): 1 via ORAL
  Filled 2019-03-16 (×3): qty 1

## 2019-03-16 MED ORDER — LISINOPRIL 20 MG PO TABS
20.0000 mg | ORAL_TABLET | Freq: Every day | ORAL | Status: DC
  Administered 2019-03-16 – 2019-03-17 (×2): 20 mg via ORAL
  Filled 2019-03-16 (×2): qty 1

## 2019-03-16 NOTE — Progress Notes (Signed)
Physical Therapy Treatment Patient Details Name: Jasmine Cohen MRN: [id number] DOB: [birth year] Today's Date: 03/16/2019    History of Present Illness 73yo female last seen normal by neighbor at unknown time on 12/14, now found unresponsive. Suspected to have infection of unknown source with high probability for sepsis. Covid negative at admit. Head CT and CTA negative. PMH HTN, DM, HLD. Pt MRI reads: Mildly motion degraded examination. No evidence of acute intracranial abnormality.    PT Comments    Patient received in bed, sleeping but easily woken, much more alert and interactive with PT this morning. She is quite [facility], so communication and assessment of cognition was difficult but expressive difficulties appear much improved, able to answer questions without difficulty (when she can fully hear them). Able to complete bed mobility with min guard, did require ModA/RW to boost to standing position, and then gait training approximately 181f with min guard/RW.  Easily fatigued and certainly seems much weaker than her baseline. Did require Mod cues for safety with device during transfers/gait today. She was left up in the chair with all needs met, chair alarm active and RN present and attending. Updated goals and recommendations to CIR due to improvement in participation and benefit of intensive therapies prior to return home.    Follow Up Recommendations  CIR     Equipment Recommendations  Rolling walker with 5" wheels;3in1 (PT)    Recommendations for Other Services       Precautions / Restrictions Precautions Precautions: Fall Precaution Comments: hx of L foot pain, HOH Restrictions Weight Bearing Restrictions: No    Mobility  Bed Mobility Overal bed mobility: Needs Assistance Bed Mobility: Supine to Sit     Supine to sit: Min guard     General bed mobility comments: min guard for safety, increased time and effort  Transfers Overall transfer level: Needs  assistance Equipment used: Rolling walker (2 wheeled) Transfers: Sit to/from Stand Sit to Stand: Mod assist         General transfer comment: ModA to boost to full standing position and gain balance, required Mod cues for hand placement and safe use of RW for transfer  Ambulation/Gait Ambulation/Gait assistance: Min guard Gait Distance (Feet): 150 Feet Assistive device: Rolling walker (2 wheeled) Gait Pattern/deviations: Step-through pattern;Drifts right/left Gait velocity: decreased   General Gait Details: min guard for safety/min cues for safe use of device; easily fatigued   Stairs             Wheelchair Mobility    Modified Rankin (Stroke Patients Only)       Balance Overall balance assessment: Needs assistance Sitting-balance support: Feet supported;No upper extremity supported Sitting balance-Leahy Scale: Good Sitting balance - Comments: S for safety   Standing balance support: During functional activity Standing balance-Leahy Scale: Fair Standing balance comment: benefits from B UE support but able to walk in room without device with min guard/increased effort                            Cognition Arousal/Alertness: Awake/alert Behavior During Therapy: WFL for tasks assessed/performed Overall Cognitive Status: Impaired/Different from baseline Area of Impairment: Orientation;Memory;Following commands;Safety/judgement;Awareness;Problem solving                 Orientation Level: Disoriented to;Situation;Time Current Attention Level: Selective Memory: Decreased short-term memory Following Commands: Follows one step commands consistently;Follows one step commands with increased time Safety/Judgement: Decreased awareness of safety;Decreased awareness of deficits Awareness: Intellectual Problem Solving:  Slow processing;Requires verbal cues General Comments: very HOH making communication somewhat difficult, expressive difficulties appear  improved today; no family present this morning      Exercises      General Comments General comments (skin integrity, edema, etc.): very pleasant and cooperative, HOH made communication and assessment of cognition difficult, still needs cues for safety      Pertinent Vitals/Pain Pain Assessment: Faces Faces Pain Scale: Hurts a little bit Pain Location: back Pain Descriptors / Indicators: Discomfort Pain Intervention(s): Limited activity within patient's tolerance;Monitored during session;Repositioned    Home Living                      Prior Function            PT Goals (current goals can now be found in the care plan section) Acute Rehab PT Goals Patient Stated Goal: "to go home" PT Goal Formulation: Patient unable to participate in goal setting Time For Goal Achievement: 03/28/19 Potential to Achieve Goals: Fair Progress towards PT goals: Progressing toward goals    Frequency    Min 3X/week      PT Plan Discharge plan needs to be updated;Frequency needs to be updated    Co-evaluation              AM-PAC PT "6 Clicks" Mobility   Outcome Measure  Help needed turning from your back to your side while in a flat bed without using bedrails?: A Little Help needed moving from lying on your back to sitting on the side of a flat bed without using bedrails?: A Little Help needed moving to and from a bed to a chair (including a wheelchair)?: A Little Help needed standing up from a chair using your arms (e.g., wheelchair or bedside chair)?: A Little Help needed to walk in hospital room?: A Little Help needed climbing 3-5 steps with a railing? : A Lot 6 Click Score: 17    End of Session Equipment Utilized During Treatment: Gait belt Activity Tolerance: Patient tolerated treatment well Patient left: in chair;with call bell/phone within reach;with chair alarm set;with nursing/sitter in room Nurse Communication: Mobility status PT Visit Diagnosis: Difficulty  in walking, not elsewhere classified (R26.2);Muscle weakness (generalized) (M62.81);Other abnormalities of gait and mobility (R26.89)     Time: 8592-7639 PT Time Calculation (min) (ACUTE ONLY): 24 min  Charges:  $Gait Training: 8-22 mins $Therapeutic Activity: 8-22 mins                     Windell Norfolk, DPT, PN1   Supplemental Physical Therapist [place]    Pager [phone] Acute Rehab Office [phone]

## 2019-03-16 NOTE — Progress Notes (Signed)
PROGRESS NOTE    Jasmine Cohen  UJW:[id number] DOB: 1945-11-03 DOA: 03/13/2019 PCP: Kaleen Mask, MD     Brief Narrative:  Jasmine Cohen is a 73 year old female with past medical history significant for type 2 diabetes, hyperlipidemia, hypertension, GERD presented after being found unresponsive.  She reportedly lives at home alone.  EMS was called to patient's house by her ex-husband after he found her unresponsive. In the emergency department, she was found to be febrile up to 101.1, WBC 13.1. Admitted for sepsis, started on broad spectrum antibiotics.  MRI brain was negative for acute intracranial abnormality.  Her mentation slowly started to improve.  New events last 24 hours / Subjective: Patient's mental status seems to be back to her baseline.  She is alert and oriented to self, place, year.  She has no complaints on examination today.  States that she does remember having diarrhea before she "passed out "at home prior to hospitalization.  No complaints of abdominal pain or diarrhea today.  Tolerating breakfast.  Assessment & Plan:   Principal Problem:   Acute metabolic encephalopathy Active Problems:   Diabetes mellitus type 2, controlled (HCC)   Hyperlipidemia   Essential hypertension   AKI (acute kidney injury) (HCC)   Type 2 diabetes mellitus without complication (HCC)   Sepsis (HCC)   Diverticulitis   Acute metabolic encephalopathy -?secondary to combination of infection, dehydration, decreased p.o. intake vs stroke -CT head without acute intracranial abnormality -MRI brain negative for acute intracranial abnormality -Seems to be back to her baseline  Sepsis secondary to diverticulitis, sepsis present on admission -Patient presented with fever 101.1, leukocytosis, altered mental status -Covid, influenza negative -UA unremarkable, urine culture negative -Blood cultures negative to date -Chest x-ray unremarkable -CT chest, abdomen, pelvis reveals colonic  diverticulosis with possible mild acute diverticulitis in the sigmoid colon -Empirically treated with Vanco, cefepime, Flagyl --> Zosyn --> Augmentin  Essential hypertension -Resume lisinopril  AKI -Resolved with IV fluids.  Resume lisinopril  Type 2 diabetes, well controlled -Hemoglobin A1c 6.8 -SSI  -Hold home Metformin  Hyperlipidemia -Continue lipitor   Hypokalemia -Replace, trend    DVT prophylaxis: SCD Code Status: Full code Family Communication: No family at bedside Disposition Plan: Clinically improving.  Awaiting CIR evaluation for disposition   Consultants:   None  Procedures:   None  Antimicrobials:  Anti-infectives (From admission, onward)   Start     Dose/Rate Route Frequency Ordered Stop   03/14/19 1700  vancomycin (VANCOCIN) 1,250 mg in sodium chloride 0.9 % 250 mL IVPB  Status:  Discontinued     1,250 mg 166.7 mL/hr over 90 Minutes Intravenous Every 24 hours 03/13/19 1539 03/14/19 0724   03/14/19 0900  piperacillin-tazobactam (ZOSYN) IVPB 3.375 g     3.375 g 12.5 mL/hr over 240 Minutes Intravenous Every 8 hours 03/14/19 0740     03/14/19 0400  metroNIDAZOLE (FLAGYL) IVPB 500 mg  Status:  Discontinued     500 mg 100 mL/hr over 60 Minutes Intravenous Every 8 hours 03/13/19 1906 03/14/19 0724   03/13/19 1545  ceFEPIme (MAXIPIME) 2 g in sodium chloride 0.9 % 100 mL IVPB  Status:  Discontinued     2 g 200 mL/hr over 30 Minutes Intravenous  Once 03/13/19 1530 03/13/19 1539   03/13/19 1545  metroNIDAZOLE (FLAGYL) IVPB 500 mg     500 mg 100 mL/hr over 60 Minutes Intravenous  Once 03/13/19 1530 03/13/19 2020   03/13/19 1545  vancomycin (VANCOCIN) IVPB 1000 mg/200 mL premix  Status:  Discontinued     1,000 mg 200 mL/hr over 60 Minutes Intravenous  Once 03/13/19 1530 03/13/19 1534   03/13/19 1545  vancomycin (VANCOCIN) 2,500 mg in sodium chloride 0.9 % 500 mL IVPB     2,500 mg 250 mL/hr over 120 Minutes Intravenous  Once 03/13/19 1534 03/13/19 2020    03/13/19 1545  ceFEPIme (MAXIPIME) 2 g in sodium chloride 0.9 % 100 mL IVPB  Status:  Discontinued     2 g 200 mL/hr over 30 Minutes Intravenous Every 12 hours 03/13/19 1539 03/14/19 0724       Objective: Vitals:   03/15/19 1629 03/15/19 2339 03/16/19 0800 03/16/19 0849  BP: (!) 145/59 (!) 147/69  (!) 148/72  Pulse: 64 72  66  Resp: 18  19   Temp: 98.5 F (36.9 C) 97.9 F (36.6 C)  98.2 F (36.8 C)  TempSrc: Oral   Oral  SpO2: 99% 98%  99%  Weight:      Height:       No intake or output data in the 24 hours ending 03/16/19 1014 Filed Weights   03/13/19 1358 03/14/19 0155  Weight: 113.4 kg 112.4 kg    Examination: General exam: Appears calm and comfortable, extremely hard of hearing Respiratory system: Clear to auscultation. Respiratory effort normal. Cardiovascular system: S1 & S2 heard, RRR. No pedal edema. Gastrointestinal system: Abdomen is nondistended, soft and nontender. Normal bowel sounds heard. Central nervous system: Alert and oriented. Non focal exam. Speech clear  Extremities: Symmetric in appearance bilaterally  Skin: No rashes, lesions or ulcers on exposed skin  Psychiatry: Judgement and insight appear stable. Mood & affect appropriate.     Data Reviewed: I have personally reviewed following labs and imaging studies  CBC: Recent Labs  Lab 03/13/19 1410 03/13/19 2108 03/14/19 0542 03/15/19 0319 03/16/19 0229  WBC 13.1*  --  10.5 9.2 10.1  NEUTROABS 11.6*  --   --   --   --   HGB 14.5 12.2 12.6 12.9 13.0  HCT 44.0 36.0 37.6 38.6 38.3  MCV 90.7  --  89.7 89.6 87.8  PLT 350  --  PLATELET CLUMPS NOTED ON SMEAR, UNABLE TO ESTIMATE 289 601   Basic Metabolic Panel: Recent Labs  Lab 03/13/19 1410 03/13/19 1900 03/13/19 2108 03/14/19 0542 03/15/19 0319 03/16/19 0229  NA 137  --  139 138 140 141  K 4.1  --  3.5 3.6 3.3* 3.3*  CL 100  --   --  104 105 107  CO2 23  --   --  20* 22 24  GLUCOSE 161*  --   --  131* 115* 106*  BUN 11  --   --  8 7*  14  CREATININE 1.17*  --   --  0.92 1.00 1.09*  CALCIUM 9.1  --   --  7.9* 8.5* 8.5*  MG  --  1.7  --  1.3* 1.8 1.9  PHOS  --   --   --  2.8  --   --    GFR: Estimated Creatinine Clearance: 60.4 mL/min (A) (by C-G formula based on SCr of 1.09 mg/dL (H)). Liver Function Tests: Recent Labs  Lab 03/13/19 1410 03/14/19 0542  AST 24 19  ALT 19 14  ALKPHOS 101 73  BILITOT 0.6 0.9  PROT 7.0 5.6*  ALBUMIN 4.0 3.1*   No results for input(s): LIPASE, AMYLASE in the last 168 hours. Recent Labs  Lab 03/13/19 1410  AMMONIA 30  Coagulation Profile: Recent Labs  Lab 03/13/19 1535  INR 0.9   Cardiac Enzymes: Recent Labs  Lab 03/13/19 1900  CKTOTAL 313*   BNP (last 3 results) No results for input(s): PROBNP in the last 8760 hours. HbA1C: Recent Labs    03/14/19 0204  HGBA1C 6.8*   CBG: Recent Labs  Lab 03/15/19 1632 03/15/19 1942 03/16/19 0107 03/16/19 0352 03/16/19 0744  GLUCAP 141* 124* 106* 97 104*   Lipid Profile: Recent Labs    03/13/19 1413  TRIG 57   Thyroid Function Tests: Recent Labs    03/14/19 0542  TSH 0.488   Anemia Panel: Recent Labs    03/13/19 1413  FERRITIN 133   Sepsis Labs: Recent Labs  Lab 03/13/19 1410 03/13/19 1413 03/13/19 1840 03/13/19 2020 03/14/19 0204  PROCALCITON  --  <0.10  --   --   --   LATICACIDVEN 2.7*  --  0.9 1.1 1.4    Recent Results (from the past 240 hour(s))  Culture, blood (routine x 2)     Status: None (Preliminary result)   Collection Time: 03/13/19  3:04 PM   Specimen: BLOOD  Result Value Ref Range Status   Specimen Description BLOOD RIGHT ANTECUBITAL  Final   Special Requests   Final    BOTTLES DRAWN AEROBIC AND ANAEROBIC Blood Culture adequate volume   Culture   Final    NO GROWTH 2 DAYS Performed at [facility], [address] [place], [place] [address]    Report Status PENDING  Incomplete  Culture, blood (routine x 2)     Status: None (Preliminary result)   Collection Time:  03/13/19  5:00 PM   Specimen: BLOOD RIGHT HAND  Result Value Ref Range Status   Specimen Description BLOOD RIGHT HAND  Final   Special Requests   Final    BOTTLES DRAWN AEROBIC AND ANAEROBIC Blood Culture adequate volume   Culture   Final    NO GROWTH 2 DAYS Performed at [facility], [address], [place], [place] [address]    Report Status PENDING  Incomplete  Respiratory Panel by RT PCR (Flu A&B, Covid) - Nasopharyngeal Swab     Status: None   Collection Time: 03/13/19  5:01 PM   Specimen: Nasopharyngeal Swab  Result Value Ref Range Status   SARS Coronavirus 2 by RT PCR NEGATIVE NEGATIVE Final    Comment: (NOTE) SARS-CoV-2 target nucleic acids are NOT DETECTED. The SARS-CoV-2 RNA is generally detectable in upper respiratoy specimens during the acute phase of infection. The lowest concentration of SARS-CoV-2 viral copies this assay can detect is 131 copies/mL. A negative result does not preclude SARS-Cov-2 infection and should not be used as the sole basis for treatment or other patient management decisions. A negative result may occur with  improper specimen collection/handling, submission of specimen other than nasopharyngeal swab, presence of viral mutation(s) within the areas targeted by this assay, and inadequate number of viral copies (<131 copies/mL). A negative result must be combined with clinical observations, patient history, and epidemiological information. The expected result is Negative. Fact Sheet for Patients:  https://www.moore.com/ Fact Sheet for Healthcare Providers:  https://www.young.biz/ This test is not yet ap proved or cleared by the [place] FDA and  has been authorized for detection and/or diagnosis of SARS-CoV-2 by FDA under an Emergency Use Authorization (EUA). This EUA will remain  in effect (meaning this test can be used) for the duration of the COVID-19 declaration under Section 564(b)(1) of  the  Act, 21 U.S.C. section 360bbb-3(b)(1), unless the authorization is terminated or revoked sooner.    Influenza A by PCR NEGATIVE NEGATIVE Final   Influenza B by PCR NEGATIVE NEGATIVE Final    Comment: (NOTE) The Xpert Xpress SARS-CoV-2/FLU/RSV assay is intended as an aid in  the diagnosis of influenza from Nasopharyngeal swab specimens and  should not be used as a sole basis for treatment. Nasal washings and  aspirates are unacceptable for Xpert Xpress SARS-CoV-2/FLU/RSV  testing. Fact Sheet for Patients: https://www.moore.com/https://www.fda.gov/media/142436/download Fact Sheet for Healthcare Providers: https://www.young.biz/https://www.fda.gov/media/142435/download This test is not yet approved or cleared by the [place] FDA and  has been authorized for detection and/or diagnosis of SARS-CoV-2 by  FDA under an Emergency Use Authorization (EUA). This EUA will remain  in effect (meaning this test can be used) for the duration of the  Covid-19 declaration under Section 564(b)(1) of the Act, 21  U.S.C. section 360bbb-3(b)(1), unless the authorization is  terminated or revoked. Performed at [facility], [address] [place], [place] [address]   Urine culture     Status: None   Collection Time: 03/13/19  8:37 PM   Specimen: Urine, Random  Result Value Ref Range Status   Specimen Description URINE, RANDOM  Final   Special Requests NONE  Final   Culture   Final    NO GROWTH Performed at [facility], [address] [place], [place] [address]    Report Status 03/14/2019 FINAL  Final  MRSA PCR Screening     Status: None   Collection Time: 03/14/19  2:08 AM   Specimen: Nasopharyngeal  Result Value Ref Range Status   MRSA by PCR NEGATIVE NEGATIVE Final    Comment:        The GeneXpert MRSA Assay (FDA approved for NASAL specimens only), is one component of a comprehensive MRSA colonization surveillance program. It is not intended to diagnose MRSA infection nor to guide or monitor treatment for MRSA  infections. Performed at [facility], [address] [place], [place] [address]       Radiology Studies: MR BRAIN WO CONTRAST  Result Date: 03/15/2019 CLINICAL DATA:  Encephalopathy. EXAM: MRI HEAD WITHOUT CONTRAST TECHNIQUE: Multiplanar, multiecho pulse sequences of the brain and surrounding structures were obtained without intravenous contrast. COMPARISON:  Head CT examinations 03/13/2019 and earlier, brain MRI 02/16/2010 FINDINGS: Brain: Multiple sequences are mildly motion degraded. There is no evidence of acute infarct. No evidence of intracranial mass. No midline shift or extra-axial fluid collection. No chronic intracranial blood products. There are few scattered punctate foci of T2/FLAIR hyperintensity within the cerebral white matter, not uncommonly seen in patients of this age. Mild generalized parenchymal atrophy. Vascular: Flow voids maintained within the proximal large arterial vessels. Skull and upper cervical spine: No focal marrow lesion. Sinuses/Orbits: Visualized orbits demonstrate no acute abnormality. Minimal ethmoid sinus mucosal thickening. Trace fluid within left mastoid air cells. IMPRESSION: Mildly motion degraded examination. No evidence of acute intracranial abnormality. Mild generalized parenchymal atrophy. Electronically Signed   By: Jackey LogeKyle  Golden DO   On: 03/15/2019 11:46      Scheduled Meds:  atorvastatin  40 mg Oral QHS   Chlorhexidine Gluconate Cloth  6 each Topical Daily   insulin aspart  0-9 Units Subcutaneous Q4H   Continuous Infusions:  piperacillin-tazobactam (ZOSYN)  IV 3.375 g (03/16/19 0531)     LOS: 2 days      Time spent: 25 minutes   Noralee StainJennifer Kerri Kovacik, DO  [facility] 03/16/2019, 10:14 AM   Available via Epic secure chat 7am-7pm After these hours, please refer to coverage provider listed on [email or web address]

## 2019-03-16 NOTE — Care Management Important Message (Signed)
Important Message  Patient Details  Name: Jasmine Cohen MRN: [id number] Date of Birth: 1945/10/30   Medicare Important Message Given:  Yes     Orbie Pyo 03/16/2019, 2:46 PM

## 2019-03-16 NOTE — Progress Notes (Signed)
Inpatient Rehabilitation-Admissions Coordinator   CIR consult received. Noted pt has progressed extremely quickly with therapies as she was Total A two days ago and is now [place] G for ambulation. Anticipate pt will continue to progress well and will not require an IP Rehab stay by the time I have a bed available for her in CIR. Will not pursue CIR for this patient at this time. I will contact TOC team to notify them of need for new discharge plan.   Raechel Ache, OTR/L  Rehab Admissions Coordinator  [phone] 03/16/2019 4:21 PM

## 2019-03-16 NOTE — Progress Notes (Signed)
NCM received call from [facility] ( 1st emergency contact person/ grandson's wife) to discuss TOC needs. NCM shared CIR denial. Judeen Hammans states pt lives alone, pt without caregiver. States ex-husband lives on the  same street. Judeen Hammans states she and pt's grandson both work , have young children and  limited space in their home. Pt's only living son lives in [place] and hasn't seen mom for years( 4 yrs).Judeen Hammans states unsure of what will be best for pt, transition to home with home health services vs ? SNF. Judeen Hammans states will  discuss with husband , and talk with pt's ex husband to see if he's willing to assist with caring for pt if pt transition to home. NCM told [facility] team will f/u with her on tomorrow after she has discussed options with husband and ex-husband.  Kerin Ransom (Other)     [phone]      Whitman Hero RN,BSN,CM [phone]

## 2019-03-17 LAB — BASIC METABOLIC PANEL
Anion gap: 10 (ref 5–15)
BUN: 14 mg/dL (ref 8–23)
CO2: 24 mmol/L (ref 22–32)
Calcium: 8.8 mg/dL — ABNORMAL LOW (ref 8.9–10.3)
Chloride: 106 mmol/L (ref 98–111)
Creatinine, Ser: 0.93 mg/dL (ref 0.44–1.00)
GFR calc Af Amer: >60 mL/min (ref >60)
GFR calc non Af Amer: >60 mL/min (ref >60)
Glucose, Bld: 102 mg/dL — ABNORMAL HIGH (ref 70–99)
Potassium: 3.9 mmol/L (ref 3.5–5.1)
Sodium: 140 mmol/L (ref 135–145)

## 2019-03-17 LAB — GLUCOSE, CAPILLARY
Glucose-Capillary: 92 mg/dL (ref 70–99)
Glucose-Capillary: 105 mg/dL — ABNORMAL HIGH (ref 70–99)

## 2019-03-17 LAB — MAGNESIUM: Magnesium: 2 mg/dL (ref 1.7–2.4)

## 2019-03-17 MED ORDER — AMOXICILLIN-POT CLAVULANATE 875-125 MG PO TABS: 1.0000 | ORAL_TABLET | Freq: Two times a day (BID) | ORAL | 0 refills | Status: AC

## 2019-03-17 NOTE — Progress Notes (Signed)
Pt discharged IV removed and sent home via wheel chair with grand daughter. Pt walked in the hall way and did well with that before leaving. She understands her medications and when to take them .

## 2019-03-17 NOTE — TOC Transition Note (Signed)
Transition of Care [facility]) - CM/SW Discharge Note   Patient Details  Name: Jasmine Cohen MRN: [id number] Date of Birth: 08-Jul-1945  Transition of Care [facility]) CM/SW Contact:  Carles Collet, RN Phone Number: 03/17/2019, 12:27 PM   Clinical Narrative:    Spoke to patient and grandaughter over the phone, discussed home needs. No DME needed.  Patient set up with [facility], no other CM needs identified.    Final next level of care: [place] Barriers to Discharge: No Barriers Identified   Patient Goals and CMS Choice        Discharge Placement                       Discharge Plan and Services                          HH Arranged: PT, OT, RN [facility] Agency: [place] ([facility]) Date HH Agency Contacted: 03/17/19 Time HH Agency Contacted: 1100 Representative spoke with at [place]: [place] ([place]) Interventions     Readmission Risk Interventions No flowsheet data found.

## 2019-03-17 NOTE — Discharge Summary (Signed)
Physician Discharge Summary  Jasmine BosworthLucille P Cohen ZOX:[id number] DOB: 04/07/1945 DOA: 03/13/2019  PCP: Kaleen MaskElkins, Wilson Oliver, MD  Admit date: 03/13/2019 Discharge date: 03/17/2019  Admitted From: Home Disposition:  Home with home health, declined SNF placement, declined by CIR   Recommendations for Outpatient Follow-up:  1. Follow up with PCP in 1 week 2. Would recommend outpatient colonoscopy screening once diverticulitis resolves   Home Health: PT OT RN    Discharge Condition: Stable CODE STATUS: Full  Diet recommendation: Heart healthy/carb modified   Brief/Interim Summary: Jasmine BosworthLucille P Cohen is a 73 year old female with past medical history significant for type 2 diabetes, hyperlipidemia, hypertension, GERD presented after being found unresponsive.  She reportedly lives at home alone.  EMS was called to patient's house by her ex-husband after he found her unresponsive. In the emergency department, she was found to be febrile up to 101.1, WBC 13.1. Admitted for sepsis, started on broad spectrum antibiotics.  MRI brain was negative for acute intracranial abnormality.  Her mentation slowly started to improve.  Once she was more awake and alert, she did recall that she had some diarrhea at home and remembers passing out prior to EMS arrival.  Discharge Diagnoses:  Principal Problem:   Acute metabolic encephalopathy Active Problems:   Diabetes mellitus type 2, controlled (HCC)   Hyperlipidemia   Essential hypertension   AKI (acute kidney injury) (HCC)   Type 2 diabetes mellitus without complication (HCC)   Sepsis (HCC)   Diverticulitis   Acute metabolic encephalopathy -Likely secondary to combination of infection, dehydration, decreased p.o. intake  -CT head without acute intracranial abnormality -MRI brain negative for acute intracranial abnormality -Now back to her baseline  Sepsis secondary to diverticulitis, sepsis present on admission -Patient presented with fever 101.1,  leukocytosis, altered mental status -Covid, influenza negative -UA unremarkable, urine culture negative -Blood cultures negative to date -Chest x-ray unremarkable -CT chest, abdomen, pelvis reveals colonic diverticulosis with possible mild acute diverticulitis in the sigmoid colon -Empirically treated with Vanco, cefepime, Flagyl --> Zosyn --> Augmentin  Essential hypertension -Resume lisinopril  AKI -Resolved with IV fluids.  Resume lisinopril  Type 2 diabetes, well controlled -Hemoglobin A1c 6.8 -SSI  -Resume Metformin  Hyperlipidemia -Continue lipitor      Discharge Instructions  Discharge Instructions    Call MD for:  difficulty breathing, headache or visual disturbances   Complete by: As directed    Call MD for:  extreme fatigue   Complete by: As directed    Call MD for:  persistant dizziness or light-headedness   Complete by: As directed    Call MD for:  persistant nausea and vomiting   Complete by: As directed    Call MD for:  severe uncontrolled pain   Complete by: As directed    Call MD for:  temperature >100.4   Complete by: As directed    Diet - low sodium heart healthy   Complete by: As directed    Diet Carb Modified   Complete by: As directed    Discharge instructions   Complete by: As directed    You were cared for by a hospitalist during your hospital stay. If you have any questions about your discharge medications or the care you received while you were in the hospital after you are discharged, you can call the unit and ask to speak with the hospitalist on call if the hospitalist that took care of you is not available. Once you are discharged, your primary care physician will handle any  further medical issues. Please note that NO REFILLS for any discharge medications will be authorized once you are discharged, as it is imperative that you return to your primary care physician (or establish a relationship with a primary care physician if you do not  have one) for your aftercare needs so that they can reassess your need for medications and monitor your lab values.   Increase activity slowly   Complete by: As directed      Allergies as of 03/17/2019      Reactions   Codeine    Numbness and tingling   Contrast Media [iodinated Diagnostic Agents] Hives   Vistaril [hydroxyzine] Other (See Comments)   hallucinations      Medication List    TAKE these medications   acetaminophen 650 MG CR tablet Commonly known as: TYLENOL Take 650 mg by mouth daily.   amoxicillin-clavulanate 875-125 MG tablet Commonly known as: AUGMENTIN Take 1 tablet by mouth every 12 (twelve) hours for 3 days.   atorvastatin 40 MG tablet Commonly known as: LIPITOR Take 40 mg by mouth at bedtime.   lisinopril 20 MG tablet Commonly known as: ZESTRIL Take 20 mg by mouth daily.   metFORMIN 1000 MG tablet Commonly known as: GLUCOPHAGE Take 500 mg by mouth 2 (two) times daily with a meal.   saccharomyces boulardii 250 MG capsule Commonly known as: FLORASTOR Take 250 mg by mouth daily.   VITAMIN D PO Take 1 tablet by mouth daily.      Follow-up Information    Leonard Downing, MD. Schedule an appointment as soon as possible for a visit in 1 week(s).   Specialty: Family Medicine Contact information: [place] [address] [phone]          Allergies  Allergen Reactions  . Codeine     Numbness and tingling  . Contrast Media [Iodinated Diagnostic Agents] Hives  . Vistaril [Hydroxyzine] Other (See Comments)    hallucinations    Consultations:  None    Procedures/Studies: CT Head Wo Contrast  Result Date: 03/13/2019 CLINICAL DATA:  Encephalopathy, unresponsive EXAM: CT HEAD WITHOUT CONTRAST TECHNIQUE: Contiguous axial images were obtained from the base of the skull through the vertex without intravenous contrast. COMPARISON:  December 13, 2012 FINDINGS: Brain: No evidence of acute territorial infarction,  hemorrhage, hydrocephalus,extra-axial collection or mass lesion/mass effect. There is mild dilatation the ventricles and sulci consistent with age-related atrophy. Low-attenuation changes in the deep white matter consistent with small vessel ischemia. Vascular: No hyperdense vessel or unexpected calcification. Skull: The skull is intact. No fracture or focal lesion identified. Sinuses/Orbits: The visualized paranasal sinuses and mastoid air cells are clear. The orbits and globes intact. Other: None IMPRESSION: No acute intracranial abnormality. Findings consistent with mild age related atrophy and chronic small vessel ischemia Electronically Signed   By: Prudencio Pair M.D.   On: 03/13/2019 16:11   CT Angio Chest PE W and/or Wo Contrast  Result Date: 03/14/2019 CLINICAL DATA:  PE suspected, high prob; Abd pain, acute, generalized Cough. Fever. Altered mental status. EXAM: CT ANGIOGRAPHY CHEST CT ABDOMEN AND PELVIS WITH CONTRAST TECHNIQUE: Multidetector CT imaging of the chest was performed using the standard protocol during bolus administration of intravenous contrast. Multiplanar CT image reconstructions and MIPs were obtained to evaluate the vascular anatomy. Multidetector CT imaging of the abdomen and pelvis was performed using the standard protocol during bolus administration of intravenous contrast. CONTRAST:  161mL OMNIPAQUE IOHEXOL 350 MG/ML SOLN COMPARISON:  Abdominal CT 09/02/2017.  FINDINGS: CTA CHEST FINDINGS Cardiovascular: There are no filling defects within the pulmonary arteries to suggest pulmonary embolus. Atherosclerosis of the thoracic aorta without dissection. Heart is normal in size. Coronary artery calcifications versus stents. No pericardial effusion. Mediastinum/Nodes: No enlarged mediastinal or hilar lymph nodes. No suspicious thyroid nodule. No esophageal wall thickening. Lungs/Pleura: Motion artifact and streak artifact from overlying monitoring device partially obscure the bases. No  consolidation to suggest pneumonia. No pleural effusion. No pulmonary mass or suspicious nodule. Musculoskeletal: There are no acute or suspicious osseous abnormalities. Mild degenerative change in the spine. Review of the MIP images confirms the above findings. CT ABDOMEN and PELVIS FINDINGS Motion and streak artifact partially obscure evaluation. Hepatobiliary: No focal liver abnormality is seen. Postcholecystectomy without biliary dilatation. Pancreas: Fatty atrophy.  No ductal dilatation or inflammation. Spleen: Normal in size. Obscured by artifact. Adrenals/Urinary Tract: No adrenal nodule. No hydronephrosis or perinephric edema. Homogeneous renal enhancement with symmetric excretion on delayed phase imaging. Urinary bladder is decompressed by Foley catheter. Stomach/Bowel: Nondistended stomach. No bowel obstruction or evidence of inflammation. Normal appendix tentatively visualized. No pericecal or right lower quadrant inflammation to suggest appendicitis. Multifocal colonic diverticulosis. Suggestion of mild pericolonic fat stranding and edema adjacent to a diverticula in the sigmoid colon, series 12, image 71. Vascular/Lymphatic: Aorto bi-iliac atherosclerosis. No aneurysm. Small central mesenteric lymph nodes with adjacent mesenteric edema are unchanged from prior exam. No enlarged lymph nodes in the abdomen or pelvis. The portal vein is patent. Reproductive: Post hysterectomy. Ovaries tentatively visualized and normal. No suspicious adnexal mass. Other: No ascites. No free air. No intra-abdominal abscess. Musculoskeletal: There are no acute or suspicious osseous abnormalities. Facet hypertrophy in the lower lumbar spine. Degenerative change in both hips and sacroiliac joints. Review of the MIP images confirms the above findings. IMPRESSION: 1. No pulmonary embolus or acute intrathoracic abnormality. 2. Coronary artery calcifications versus stents. 3. Colonic diverticulosis with possible mild acute  diverticulitis in the sigmoid colon. 4. 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 Aortic Atherosclerosis (ICD10-I70.0). Electronically Signed   By: Narda Rutherford M.D.   On: 03/14/2019 01:46   MR BRAIN WO CONTRAST  Result Date: 03/15/2019 CLINICAL DATA:  Encephalopathy. EXAM: MRI HEAD WITHOUT CONTRAST TECHNIQUE: Multiplanar, multiecho pulse sequences of the brain and surrounding structures were obtained without intravenous contrast. COMPARISON:  Head CT examinations 03/13/2019 and earlier, brain MRI 02/16/2010 FINDINGS: Brain: Multiple sequences are mildly motion degraded. There is no evidence of acute infarct. No evidence of intracranial mass. No midline shift or extra-axial fluid collection. No chronic intracranial blood products. There are few scattered punctate foci of T2/FLAIR hyperintensity within the cerebral white matter, not uncommonly seen in patients of this age. Mild generalized parenchymal atrophy. Vascular: Flow voids maintained within the proximal large arterial vessels. Skull and upper cervical spine: No focal marrow lesion. Sinuses/Orbits: Visualized orbits demonstrate no acute abnormality. Minimal ethmoid sinus mucosal thickening. Trace fluid within left mastoid air cells. IMPRESSION: Mildly motion degraded examination. No evidence of acute intracranial abnormality. Mild generalized parenchymal atrophy. Electronically Signed   By: Jackey Loge DO   On: 03/15/2019 11:46   CT ABDOMEN PELVIS W CONTRAST  Result Date: 03/14/2019 CLINICAL DATA:  PE suspected, high prob; Abd pain, acute, generalized Cough. Fever. Altered mental status. EXAM: CT ANGIOGRAPHY CHEST CT ABDOMEN AND PELVIS WITH CONTRAST TECHNIQUE: Multidetector CT imaging of the chest was performed using the standard protocol during bolus administration of intravenous contrast. Multiplanar CT image reconstructions  and MIPs were obtained to evaluate the vascular  anatomy. Multidetector CT imaging of the abdomen and pelvis was performed using the standard protocol during bolus administration of intravenous contrast. CONTRAST:  OMNIPAQUE IOHEXOL 350 MG/ML SOLN COMPARISON:  Abdominal CT 09/02/2017. FINDINGS: CTA CHEST FINDINGS Cardiovascular: There are no filling defects within the pulmonary arteries to suggest pulmonary embolus. Atherosclerosis of the thoracic aorta without dissection. Heart is normal in size. Coronary artery calcifications versus stents. No pericardial effusion. Mediastinum/Nodes: No enlarged mediastinal or hilar lymph nodes. No suspicious thyroid nodule. No esophageal wall thickening. Lungs/Pleura: Motion artifact and streak artifact from overlying monitoring device partially obscure the bases. No consolidation to suggest pneumonia. No pleural effusion. No pulmonary mass or suspicious nodule. Musculoskeletal: There are no acute or suspicious osseous abnormalities. Mild degenerative change in the spine. Review of the MIP images confirms the above findings. CT ABDOMEN and PELVIS FINDINGS Motion and streak artifact partially obscure evaluation. Hepatobiliary: No focal liver abnormality is seen. Postcholecystectomy without biliary dilatation. Pancreas: Fatty atrophy.  No ductal dilatation or inflammation. Spleen: Normal in size. Obscured by artifact. Adrenals/Urinary Tract: No adrenal nodule. No hydronephrosis or perinephric edema. Homogeneous renal enhancement with symmetric excretion on delayed phase imaging. Urinary bladder is decompressed by Foley catheter. Stomach/Bowel: Nondistended stomach. No bowel obstruction or evidence of inflammation. Normal appendix tentatively visualized. No pericecal or right lower quadrant inflammation to suggest appendicitis. Multifocal colonic diverticulosis. Suggestion of mild pericolonic fat stranding and edema adjacent to a diverticula in the sigmoid colon, series 12, image 71. Vascular/Lymphatic: Aorto bi-iliac  atherosclerosis. No aneurysm. Small central mesenteric lymph nodes with adjacent mesenteric edema are unchanged from prior exam. No enlarged lymph nodes in the abdomen or pelvis. The portal vein is patent. Reproductive: Post hysterectomy. Ovaries tentatively visualized and normal. No suspicious adnexal mass. Other: No ascites. No free air. No intra-abdominal abscess. Musculoskeletal: There are no acute or suspicious osseous abnormalities. Facet hypertrophy in the lower lumbar spine. Degenerative change in both hips and sacroiliac joints. Review of the MIP images confirms the above findings. IMPRESSION: 1. No pulmonary embolus or acute intrathoracic abnormality. 2. Coronary artery calcifications versus stents. 3. Colonic diverticulosis with possible mild acute diverticulitis in the sigmoid colon. 4. 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 Aortic Atherosclerosis (ICD10-I70.0). Electronically Signed   By: Narda Rutherford M.D.   On: 03/14/2019 01:46   DG Chest Port 1 View  Result Date: 03/13/2019 CLINICAL DATA:  Altered mental status with chest pain and fever EXAM: PORTABLE CHEST 1 VIEW COMPARISON:  Aug 16, 2017 FINDINGS: There is no evident edema or consolidation. Heart size and pulmonary vascularity are normal. No adenopathy. No pneumothorax. There is degenerative change in the thoracic spine. IMPRESSION: No edema or consolidation.  No adenopathy. Electronically Signed   By: Bretta Bang III M.D.   On: 03/13/2019 14:28   DG Foot Complete Left  Result Date: 02/19/2019 CLINICAL DATA:  Swelling, left foot edema, pain, no known injury EXAM: LEFT FOOT - COMPLETE 3+ VIEW COMPARISON:  Radiograph 04/28/2017 FINDINGS: The osseous structures appear diffusely demineralized which may limit detection of small or nondisplaced fractures. Progressive moderate degenerative change of the first MTP. Mild-to-moderate degenerative changes throughout  the interphalangeal joints and tarsal articulations. Remote corticated fragment at the base of the fifth metatarsal may reflect sequela of prior avulsion type injury. Corticated os peroneum is noted as well. Plantar calcaneal spur is seen. No acute fracture or traumatic malalignment. IMPRESSION: 1. The osseous  structures appear diffusely demineralized which may limit detection of small or nondisplaced fractures. 2. Fracture no acute fracture or traumatic malalignment. 3. Corticated fragment at the base of the fifth metatarsal may reflect prior injury. 4. Minimally progressive degenerative changes throughout the foot. Electronically Signed   By: Kreg Shropshire M.D.   On: 02/19/2019 23:49       Discharge Exam: Vitals:   03/16/19 2333 03/17/19 0806  BP: 136/64 (!) 164/59  Pulse: 62 64  Resp: 18 16  Temp: 98 F (36.7 C) 97.6 F (36.4 C)  SpO2: 99% 100%     General: Pt is alert, awake, not in acute distress Cardiovascular: RRR, S1/S2 +, no edema Respiratory: CTA bilaterally, no wheezing, no rhonchi, no respiratory distress, no conversational dyspnea  Abdominal: Soft, NT, ND, bowel sounds + Extremities: no edema, no cyanosis Psych: Normal mood and affect, stable judgement and insight     The results of significant diagnostics from this hospitalization (including imaging, microbiology, ancillary and laboratory) are listed below for reference.     Microbiology: Recent Results (from the past 240 hour(s))  Culture, blood (routine x 2)     Status: None (Preliminary result)   Collection Time: 03/13/19  3:04 PM   Specimen: BLOOD  Result Value Ref Range Status   Specimen Description BLOOD RIGHT ANTECUBITAL  Final   Special Requests   Final    BOTTLES DRAWN AEROBIC AND ANAEROBIC Blood Culture adequate volume   Culture   Final    NO GROWTH 3 DAYS Performed at [facility], [address], [place], [place] [address]    Report Status PENDING  Incomplete  Culture, blood (routine x 2)      Status: None (Preliminary result)   Collection Time: 03/13/19  5:00 PM   Specimen: BLOOD RIGHT HAND  Result Value Ref Range Status   Specimen Description BLOOD RIGHT HAND  Final   Special Requests   Final    BOTTLES DRAWN AEROBIC AND ANAEROBIC Blood Culture adequate volume   Culture   Final    NO GROWTH 3 DAYS Performed at [facility], [address], [place], [place] [address]    Report Status PENDING  Incomplete  Respiratory Panel by RT PCR (Flu A&B, Covid) - Nasopharyngeal Swab     Status: None   Collection Time: 03/13/19  5:01 PM   Specimen: Nasopharyngeal Swab  Result Value Ref Range Status   SARS Coronavirus 2 by RT PCR NEGATIVE NEGATIVE Final    Comment: (NOTE) SARS-CoV-2 target nucleic acids are NOT DETECTED. The SARS-CoV-2 RNA is generally detectable in upper respiratoy specimens during the acute phase of infection. The lowest concentration of SARS-CoV-2 viral copies this assay can detect is 131 copies/mL. A negative result does not preclude SARS-Cov-2 infection and should not be used as the sole basis for treatment or other patient management decisions. A negative result may occur with  improper specimen collection/handling, submission of specimen other than nasopharyngeal swab, presence of viral mutation(s) within the areas targeted by this assay, and inadequate number of viral copies (<131 copies/mL). A negative result must be combined with clinical observations, patient history, and epidemiological information. The expected result is Negative. Fact Sheet for Patients:  https://www.moore.com/ Fact Sheet for Healthcare Providers:  https://www.young.biz/ This test is not yet ap proved or cleared by the [place] FDA and  has been authorized for detection and/or diagnosis of SARS-CoV-2 by FDA under an Emergency Use Authorization (EUA). This EUA will remain  in  effect (meaning this test can be used) for the duration of  the COVID-19 declaration under Section 564(b)(1) of the Act, 21 U.S.C. section 360bbb-3(b)(1), unless the authorization is terminated or revoked sooner.    Influenza A by PCR NEGATIVE NEGATIVE Final   Influenza B by PCR NEGATIVE NEGATIVE Final    Comment: (NOTE) The Xpert Xpress SARS-CoV-2/FLU/RSV assay is intended as an aid in  the diagnosis of influenza from Nasopharyngeal swab specimens and  should not be used as a sole basis for treatment. Nasal washings and  aspirates are unacceptable for Xpert Xpress SARS-CoV-2/FLU/RSV  testing. Fact Sheet for Patients: https://www.moore.com/ Fact Sheet for Healthcare Providers: https://www.young.biz/ This test is not yet approved or cleared by the [place] FDA and  has been authorized for detection and/or diagnosis of SARS-CoV-2 by  FDA under an Emergency Use Authorization (EUA). This EUA will remain  in effect (meaning this test can be used) for the duration of the  Covid-19 declaration under Section 564(b)(1) of the Act, 21  U.S.C. section 360bbb-3(b)(1), unless the authorization is  terminated or revoked. Performed at [facility], [address] [place], [place] [address]   Urine culture     Status: None   Collection Time: 03/13/19  8:37 PM   Specimen: Urine, Random  Result Value Ref Range Status   Specimen Description URINE, RANDOM  Final   Special Requests NONE  Final   Culture   Final    NO GROWTH Performed at [facility], [address], [place], [place] [address]    Report Status 03/14/2019 FINAL  Final  MRSA PCR Screening     Status: None   Collection Time: 03/14/19  2:08 AM   Specimen: Nasopharyngeal  Result Value Ref Range Status   MRSA by PCR NEGATIVE NEGATIVE Final    Comment:        The GeneXpert MRSA Assay (FDA approved for NASAL specimens only), is one component of a comprehensive MRSA colonization surveillance program. It is not intended to diagnose  MRSA infection nor to guide or monitor treatment for MRSA infections. Performed at [facility], [address], [place], [place] [address]      Labs: BNP (last 3 results) No results for input(s): BNP in the last 8760 hours. Basic Metabolic Panel: Recent Labs  Lab 03/13/19 1410 03/13/19 1900 03/13/19 2108 03/14/19 0542 03/15/19 0319 03/16/19 0229 03/17/19 0247  NA 137  --  139 138 140 141 140  K 4.1  --  3.5 3.6 3.3* 3.3* 3.9  CL 100  --   --  104 105 107 106  CO2 23  --   --  20* GLUCOSE 161*  --   --  131* 115* 106* 102*  BUN 11  --   --  8 7* 14 14  CREATININE 1.17*  --   --  0.92 1.00 1.09* 0.93  CALCIUM 9.1  --   --  7.9* 8.5* 8.5* 8.8*  MG  --  1.7  --  1.3* 1.8 1.9 2.0  PHOS  --   --   --  2.8  --   --   --    Liver Function Tests: Recent Labs  Lab 03/13/19 1410 03/14/19 0542  AST 24 19  ALT 19 14  ALKPHOS 101 73  BILITOT 0.6 0.9  PROT 7.0 5.6*  ALBUMIN 4.0 3.1*   No results for input(s): LIPASE, AMYLASE in the last 168 hours. Recent  Labs  Lab 03/13/19 1410  AMMONIA 30   CBC: Recent Labs  Lab 03/13/19 1410 03/13/19 2108 03/14/19 0542 03/15/19 0319 03/16/19 0229  WBC 13.1*  --  10.5 9.2 10.1  NEUTROABS 11.6*  --   --   --   --   HGB 14.5 12.2 12.6 12.9 13.0  HCT 44.0 36.0 37.6 38.6 38.3  MCV 90.7  --  89.7 89.6 87.8  PLT 350  --  PLATELET CLUMPS NOTED ON SMEAR, UNABLE TO ESTIMATE 289 307   Cardiac Enzymes: Recent Labs  Lab 03/13/19 1900  CKTOTAL 313*   BNP: Invalid input(s): POCBNP CBG: Recent Labs  Lab 03/16/19 2015 03/16/19 2125 03/16/19 2328 03/17/19 0452 03/17/19 0755  GLUCAP 208* 163* 90 92 105*   D-Dimer No results for input(s): DDIMER in the last 72 hours. Hgb A1c No results for input(s): HGBA1C in the last 72 hours. Lipid Profile No results for input(s): CHOL, HDL, LDLCALC, TRIG, CHOLHDL, LDLDIRECT in the last 72 hours. Thyroid function studies No results for input(s): TSH, T4TOTAL, T3FREE,  THYROIDAB in the last 72 hours.  Invalid input(s): FREET3 Anemia work up No results for input(s): VITAMINB12, FOLATE, FERRITIN, TIBC, IRON, RETICCTPCT in the last 72 hours. Urinalysis    Component Value Date/Time   COLORURINE YELLOW 03/13/2019 1644   APPEARANCEUR CLEAR 03/13/2019 1644   LABSPEC 1.017 03/13/2019 1644   PHURINE 8.0 03/13/2019 1644   GLUCOSEU NEGATIVE 03/13/2019 1644   HGBUR NEGATIVE 03/13/2019 1644   BILIRUBINUR NEGATIVE 03/13/2019 1644   KETONESUR 20 (A) 03/13/2019 1644   PROTEINUR NEGATIVE 03/13/2019 1644   UROBILINOGEN 1.0 03/26/2014 2319   NITRITE NEGATIVE 03/13/2019 1644   LEUKOCYTESUR NEGATIVE 03/13/2019 1644   Sepsis Labs Invalid input(s): PROCALCITONIN,  WBC,  LACTICIDVEN Microbiology Recent Results (from the past 240 hour(s))  Culture, blood (routine x 2)     Status: None (Preliminary result)   Collection Time: 03/13/19  3:04 PM   Specimen: BLOOD  Result Value Ref Range Status   Specimen Description BLOOD RIGHT ANTECUBITAL  Final   Special Requests   Final    BOTTLES DRAWN AEROBIC AND ANAEROBIC Blood Culture adequate volume   Culture   Final    NO GROWTH 3 DAYS Performed at [facility], [address], [place], [place] [address]    Report Status PENDING  Incomplete  Culture, blood (routine x 2)     Status: None (Preliminary result)   Collection Time: 03/13/19  5:00 PM   Specimen: BLOOD RIGHT HAND  Result Value Ref Range Status   Specimen Description BLOOD RIGHT HAND  Final   Special Requests   Final    BOTTLES DRAWN AEROBIC AND ANAEROBIC Blood Culture adequate volume   Culture   Final    NO GROWTH 3 DAYS Performed at [facility], [address] [place], [place] [address]    Report Status PENDING  Incomplete  Respiratory Panel by RT PCR (Flu A&B, Covid) - Nasopharyngeal Swab     Status: None   Collection Time: 03/13/19  5:01 PM   Specimen: Nasopharyngeal Swab  Result Value Ref Range Status   SARS Coronavirus 2 by RT PCR  NEGATIVE NEGATIVE Final    Comment: (NOTE) SARS-CoV-2 target nucleic acids are NOT DETECTED. The SARS-CoV-2 RNA is generally detectable in upper respiratoy specimens during the acute phase of infection. The lowest concentration of SARS-CoV-2 viral copies this assay can detect is 131 copies/mL. A negative result does not preclude SARS-Cov-2 infection and should  not be used as the sole basis for treatment or other patient management decisions. A negative result may occur with  improper specimen collection/handling, submission of specimen other than nasopharyngeal swab, presence of viral mutation(s) within the areas targeted by this assay, and inadequate number of viral copies (<131 copies/mL). A negative result must be combined with clinical observations, patient history, and epidemiological information. The expected result is Negative. Fact Sheet for Patients:  https://www.moore.com/ Fact Sheet for Healthcare Providers:  https://www.young.biz/ This test is not yet ap proved or cleared by the [place] FDA and  has been authorized for detection and/or diagnosis of SARS-CoV-2 by FDA under an Emergency Use Authorization (EUA). This EUA will remain  in effect (meaning this test can be used) for the duration of the COVID-19 declaration under Section 564(b)(1) of the Act, 21 U.S.C. section 360bbb-3(b)(1), unless the authorization is terminated or revoked sooner.    Influenza A by PCR NEGATIVE NEGATIVE Final   Influenza B by PCR NEGATIVE NEGATIVE Final    Comment: (NOTE) The Xpert Xpress SARS-CoV-2/FLU/RSV assay is intended as an aid in  the diagnosis of influenza from Nasopharyngeal swab specimens and  should not be used as a sole basis for treatment. Nasal washings and  aspirates are unacceptable for Xpert Xpress SARS-CoV-2/FLU/RSV  testing. Fact Sheet for Patients: https://www.moore.com/ Fact Sheet for Healthcare  Providers: https://www.young.biz/ This test is not yet approved or cleared by the [place] FDA and  has been authorized for detection and/or diagnosis of SARS-CoV-2 by  FDA under an Emergency Use Authorization (EUA). This EUA will remain  in effect (meaning this test can be used) for the duration of the  Covid-19 declaration under Section 564(b)(1) of the Act, 21  U.S.C. section 360bbb-3(b)(1), unless the authorization is  terminated or revoked. Performed at [facility], [address], [place], [place] [address]   Urine culture     Status: None   Collection Time: 03/13/19  8:37 PM   Specimen: Urine, Random  Result Value Ref Range Status   Specimen Description URINE, RANDOM  Final   Special Requests NONE  Final   Culture   Final    NO GROWTH Performed at [facility], [address] [place], [place] [address]    Report Status 03/14/2019 FINAL  Final  MRSA PCR Screening     Status: None   Collection Time: 03/14/19  2:08 AM   Specimen: Nasopharyngeal  Result Value Ref Range Status   MRSA by PCR NEGATIVE NEGATIVE Final    Comment:        The GeneXpert MRSA Assay (FDA approved for NASAL specimens only), is one component of a comprehensive MRSA colonization surveillance program. It is not intended to diagnose MRSA infection nor to guide or monitor treatment for MRSA infections. Performed at [facility], [address] [place], [place] [address]      Patient was seen and examined on the day of discharge and was found to be in stable condition. Time coordinating discharge: 35 minutes including assessment and coordination of care, as well as examination of the patient.   SIGNED:  Noralee Stain, DO [facility] 03/17/2019, 10:40 AM

## 2019-03-18 LAB — CULTURE, BLOOD (ROUTINE X 2)
Culture: NO GROWTH
Culture: NO GROWTH
Special Requests: ADEQUATE
Special Requests: ADEQUATE

## 2019-11-21 ENCOUNTER — Observation Stay (HOSPITAL_COMMUNITY)
Admission: EM | Admit: 2019-11-21 | Discharge: 2019-11-23 | Disposition: A | Discharge status: Home / Self Care | Payer: Medicare PPO | Attending: Internal Medicine | Admitting: Internal Medicine

## 2019-11-21 ENCOUNTER — Inpatient Hospital Stay (HOSPITAL_COMMUNITY): Payer: Medicare PPO

## 2019-11-21 ENCOUNTER — Emergency Department (HOSPITAL_COMMUNITY): Payer: Medicare PPO

## 2019-11-21 ENCOUNTER — Other Ambulatory Visit: Payer: Self-pay

## 2019-11-21 ENCOUNTER — Encounter (HOSPITAL_COMMUNITY): Payer: Self-pay

## 2019-11-21 DIAGNOSIS — Z9049 Acquired absence of other specified parts of digestive tract: Secondary | ICD-10-CM | POA: Insufficient documentation

## 2019-11-21 DIAGNOSIS — D72828 Other elevated white blood cell count: Secondary | ICD-10-CM | POA: Insufficient documentation

## 2019-11-21 DIAGNOSIS — Z20822 Contact with and (suspected) exposure to covid-19: Secondary | ICD-10-CM | POA: Insufficient documentation

## 2019-11-21 DIAGNOSIS — E872 Acidosis, unspecified: Secondary | ICD-10-CM | POA: Diagnosis present

## 2019-11-21 DIAGNOSIS — I10 Essential (primary) hypertension: Secondary | ICD-10-CM | POA: Diagnosis not present

## 2019-11-21 DIAGNOSIS — N179 Acute kidney failure, unspecified: Secondary | ICD-10-CM | POA: Diagnosis present

## 2019-11-21 DIAGNOSIS — E785 Hyperlipidemia, unspecified: Secondary | ICD-10-CM | POA: Diagnosis present

## 2019-11-21 DIAGNOSIS — E119 Type 2 diabetes mellitus without complications: Secondary | ICD-10-CM | POA: Diagnosis not present

## 2019-11-21 DIAGNOSIS — R4182 Altered mental status, unspecified: Secondary | ICD-10-CM | POA: Diagnosis present

## 2019-11-21 DIAGNOSIS — Z7901 Long term (current) use of anticoagulants: Secondary | ICD-10-CM | POA: Diagnosis not present

## 2019-11-21 DIAGNOSIS — D72829 Elevated white blood cell count, unspecified: Secondary | ICD-10-CM | POA: Diagnosis present

## 2019-11-21 DIAGNOSIS — Z79899 Other long term (current) drug therapy: Secondary | ICD-10-CM | POA: Insufficient documentation

## 2019-11-21 DIAGNOSIS — G9341 Metabolic encephalopathy: Principal | ICD-10-CM | POA: Diagnosis present

## 2019-11-21 DIAGNOSIS — R410 Disorientation, unspecified: Secondary | ICD-10-CM

## 2019-11-21 DIAGNOSIS — G9340 Encephalopathy, unspecified: Secondary | ICD-10-CM | POA: Diagnosis present

## 2019-11-21 LAB — CBC WITH DIFFERENTIAL/PLATELET
Abs Immature Granulocytes: 0.09 10*3/uL — ABNORMAL HIGH (ref 0.00–0.07)
Basophils Absolute: 0.1 10*3/uL (ref 0.0–0.1)
Basophils Relative: 1 %
Eosinophils Absolute: 0.1 10*3/uL (ref 0.0–0.5)
Eosinophils Relative: 1 %
HCT: 44.9 % (ref 36.0–46.0)
Hemoglobin: 14.2 g/dL (ref 12.0–15.0)
Immature Granulocytes: 1 %
Lymphocytes Relative: 15 %
Lymphs Abs: 1.7 10*3/uL (ref 0.7–4.0)
MCH: 29.2 pg (ref 26.0–34.0)
MCHC: 31.6 g/dL (ref 30.0–36.0)
MCV: 92.4 fL (ref 80.0–100.0)
Monocytes Absolute: 0.7 10*3/uL (ref 0.1–1.0)
Monocytes Relative: 6 %
Neutro Abs: 8.7 10*3/uL — ABNORMAL HIGH (ref 1.7–7.7)
Neutrophils Relative %: 76 %
Platelets: 344 10*3/uL (ref 150–400)
RBC: 4.86 MIL/uL (ref 3.87–5.11)
RDW: 13.9 % (ref 11.5–15.5)
WBC: 11.4 10*3/uL — ABNORMAL HIGH (ref 4.0–10.5)
nRBC: 0 % (ref 0.0–0.2)

## 2019-11-21 LAB — TSH: TSH: 0.561 u[IU]/mL (ref 0.350–4.500)

## 2019-11-21 LAB — AMMONIA: Ammonia: 21 umol/L (ref 9–35)

## 2019-11-21 LAB — COMPREHENSIVE METABOLIC PANEL
ALT: 14 U/L (ref 0–44)
AST: 20 U/L (ref 15–41)
Albumin: 3.8 g/dL (ref 3.5–5.0)
Alkaline Phosphatase: 84 U/L (ref 38–126)
Anion gap: 16 — ABNORMAL HIGH (ref 5–15)
BUN: 33 mg/dL — ABNORMAL HIGH (ref 8–23)
CO2: 21 mmol/L — ABNORMAL LOW (ref 22–32)
Calcium: 9.3 mg/dL (ref 8.9–10.3)
Chloride: 102 mmol/L (ref 98–111)
Creatinine, Ser: 1.23 mg/dL — ABNORMAL HIGH (ref 0.44–1.00)
GFR calc Af Amer: 50 mL/min — ABNORMAL LOW (ref >60)
GFR calc non Af Amer: 43 mL/min — ABNORMAL LOW (ref >60)
Glucose, Bld: 165 mg/dL — ABNORMAL HIGH (ref 70–99)
Potassium: 4.4 mmol/L (ref 3.5–5.1)
Sodium: 139 mmol/L (ref 135–145)
Total Bilirubin: 0.9 mg/dL (ref 0.3–1.2)
Total Protein: 6.5 g/dL (ref 6.5–8.1)

## 2019-11-21 LAB — RAPID URINE DRUG SCREEN, HOSP PERFORMED
Amphetamines: NOT DETECTED
Barbiturates: NOT DETECTED
Benzodiazepines: NOT DETECTED
Cocaine: NOT DETECTED
Opiates: NOT DETECTED
Tetrahydrocannabinol: NOT DETECTED

## 2019-11-21 LAB — I-STAT CHEM 8, ED
BUN: 33 mg/dL — ABNORMAL HIGH (ref 8–23)
Calcium, Ion: 1.1 mmol/L — ABNORMAL LOW (ref 1.15–1.40)
Chloride: 102 mmol/L (ref 98–111)
Creatinine, Ser: 1.1 mg/dL — ABNORMAL HIGH (ref 0.44–1.00)
Glucose, Bld: 164 mg/dL — ABNORMAL HIGH (ref 70–99)
HCT: 42 % (ref 36.0–46.0)
Hemoglobin: 14.3 g/dL (ref 12.0–15.0)
Potassium: 4.2 mmol/L (ref 3.5–5.1)
Sodium: 138 mmol/L (ref 135–145)
TCO2: 21 mmol/L — ABNORMAL LOW (ref 22–32)

## 2019-11-21 LAB — APTT: aPTT: 24 seconds (ref 24–36)

## 2019-11-21 LAB — ETHANOL: Alcohol, Ethyl (B): <10 mg/dL (ref <10)

## 2019-11-21 LAB — URINALYSIS, ROUTINE W REFLEX MICROSCOPIC
Bilirubin Urine: NEGATIVE
Glucose, UA: NEGATIVE mg/dL
Hgb urine dipstick: NEGATIVE
Ketones, ur: NEGATIVE mg/dL
Leukocytes,Ua: NEGATIVE
Nitrite: NEGATIVE
Protein, ur: NEGATIVE mg/dL
Specific Gravity, Urine: 1.019 (ref 1.005–1.030)
pH: 8 (ref 5.0–8.0)

## 2019-11-21 LAB — CBC
HCT: 38.3 % (ref 36.0–46.0)
Hemoglobin: 12.3 g/dL (ref 12.0–15.0)
MCH: 29.4 pg (ref 26.0–34.0)
MCHC: 32.1 g/dL (ref 30.0–36.0)
MCV: 91.6 fL (ref 80.0–100.0)
Platelets: 336 10*3/uL (ref 150–400)
RBC: 4.18 MIL/uL (ref 3.87–5.11)
RDW: 13.7 % (ref 11.5–15.5)
WBC: 11.7 10*3/uL — ABNORMAL HIGH (ref 4.0–10.5)
nRBC: 0 % (ref 0.0–0.2)

## 2019-11-21 LAB — PROTIME-INR
INR: 0.9 (ref 0.8–1.2)
INR: 1 (ref 0.8–1.2)
Prothrombin Time: 11.8 seconds (ref 11.4–15.2)
Prothrombin Time: 12.4 seconds (ref 11.4–15.2)

## 2019-11-21 LAB — CBG MONITORING, ED
Glucose-Capillary: 156 mg/dL — ABNORMAL HIGH (ref 70–99)
Glucose-Capillary: 122 mg/dL — ABNORMAL HIGH (ref 70–99)

## 2019-11-21 LAB — LACTIC ACID, PLASMA
Lactic Acid, Venous: 2 mmol/L (ref 0.5–1.9)
Lactic Acid, Venous: 2 mmol/L (ref 0.5–1.9)
Lactic Acid, Venous: 1.1 mmol/L (ref 0.5–1.9)

## 2019-11-21 LAB — HEMOGLOBIN A1C
Hgb A1c MFr Bld: 6.9 % — ABNORMAL HIGH (ref 4.8–5.6)
Mean Plasma Glucose: 151.33 mg/dL

## 2019-11-21 LAB — MAGNESIUM: Magnesium: 1.6 mg/dL — ABNORMAL LOW (ref 1.7–2.4)

## 2019-11-21 LAB — SARS CORONAVIRUS 2 BY RT PCR (HOSPITAL ORDER, PERFORMED IN ~~LOC~~ HOSPITAL LAB): SARS Coronavirus 2: NEGATIVE

## 2019-11-21 LAB — VITAMIN B12: Vitamin B-12: 145 pg/mL — ABNORMAL LOW (ref 180–914)

## 2019-11-21 LAB — FOLATE: Folate: 14.4 ng/mL (ref >5.9)

## 2019-11-21 LAB — CREATININE, SERUM
Creatinine, Ser: 0.9 mg/dL (ref 0.44–1.00)
GFR calc Af Amer: >60 mL/min (ref >60)
GFR calc non Af Amer: >60 mL/min (ref >60)

## 2019-11-21 LAB — CK: Total CK: 66 U/L (ref 38–234)

## 2019-11-21 LAB — PHOSPHORUS: Phosphorus: 2.7 mg/dL (ref 2.5–4.6)

## 2019-11-21 LAB — GLUCOSE, CAPILLARY: Glucose-Capillary: 94 mg/dL (ref 70–99)

## 2019-11-21 LAB — LIPASE, BLOOD: Lipase: 40 U/L (ref 11–51)

## 2019-11-21 MED ORDER — ENOXAPARIN SODIUM 40 MG/0.4ML ~~LOC~~ SOLN
40.0000 mg | SUBCUTANEOUS | Status: DC
  Administered 2019-11-21 – 2019-11-22 (×2): 40 mg via SUBCUTANEOUS
  Filled 2019-11-21 (×3): qty 0.4

## 2019-11-21 MED ORDER — HYDRALAZINE HCL 20 MG/ML IJ SOLN: 10.0000 mg | Freq: Four times a day (QID) | INTRAMUSCULAR | Status: DC | PRN

## 2019-11-21 MED ORDER — ONDANSETRON HCL 4 MG/2ML IJ SOLN
4.0000 mg | Freq: Four times a day (QID) | INTRAMUSCULAR | Status: DC | PRN
  Administered 2019-11-21: 4 mg via INTRAVENOUS
  Filled 2019-11-21: qty 2

## 2019-11-21 MED ORDER — MAGNESIUM SULFATE 2 GM/50ML IV SOLN
2.0000 g | Freq: Once | INTRAVENOUS | Status: AC
  Administered 2019-11-21: 2 g via INTRAVENOUS
  Filled 2019-11-21: qty 50

## 2019-11-21 MED ORDER — LORAZEPAM 2 MG/ML IJ SOLN
1.0000 mg | Freq: Once | INTRAMUSCULAR | Status: AC
  Administered 2019-11-21: 1 mg via INTRAVENOUS
  Filled 2019-11-21: qty 1

## 2019-11-21 MED ORDER — SODIUM CHLORIDE 0.9 % IV SOLN: INTRAVENOUS | Status: DC

## 2019-11-21 MED ORDER — INSULIN ASPART 100 UNIT/ML ~~LOC~~ SOLN
0.0000 [IU] | Freq: Three times a day (TID) | SUBCUTANEOUS | Status: DC
  Administered 2019-11-21: 2 [IU] via SUBCUTANEOUS
  Administered 2019-11-22: 5 [IU] via SUBCUTANEOUS
  Administered 2019-11-23: 3 [IU] via SUBCUTANEOUS

## 2019-11-21 MED ORDER — ACETAMINOPHEN 325 MG PO TABS
650.0000 mg | ORAL_TABLET | Freq: Four times a day (QID) | ORAL | Status: DC | PRN
  Administered 2019-11-21: 650 mg via ORAL
  Filled 2019-11-21: qty 2

## 2019-11-21 MED ORDER — INSULIN ASPART 100 UNIT/ML ~~LOC~~ SOLN: 0.0000 [IU] | Freq: Every day | SUBCUTANEOUS | Status: DC

## 2019-11-21 MED ORDER — ALBUTEROL SULFATE (2.5 MG/3ML) 0.083% IN NEBU: 2.5000 mg | INHALATION_SOLUTION | RESPIRATORY_TRACT | Status: DC | PRN

## 2019-11-21 MED ORDER — ACETAMINOPHEN 650 MG RE SUPP: 650.0000 mg | Freq: Four times a day (QID) | RECTAL | Status: DC | PRN

## 2019-11-21 MED ORDER — ONDANSETRON HCL 4 MG PO TABS: 4.0000 mg | ORAL_TABLET | Freq: Four times a day (QID) | ORAL | Status: DC | PRN

## 2019-11-21 MED ORDER — SODIUM CHLORIDE 0.9 % IV BOLUS
500.0000 mL | Freq: Once | INTRAVENOUS | Status: AC
  Administered 2019-11-21: 500 mL via INTRAVENOUS

## 2019-11-21 MED ORDER — SODIUM CHLORIDE 0.9 % IV SOLN
2.0000 g | Freq: Once | INTRAVENOUS | Status: AC
  Administered 2019-11-21: 2 g via INTRAVENOUS
  Filled 2019-11-21: qty 20

## 2019-11-21 MED ORDER — CYANOCOBALAMIN 1000 MCG/ML IJ SOLN
1000.0000 ug | Freq: Once | INTRAMUSCULAR | Status: AC
  Administered 2019-11-21: 1000 ug via INTRAMUSCULAR
  Filled 2019-11-21 (×2): qty 1

## 2019-11-21 NOTE — ED Provider Notes (Signed)
[facility] EMERGENCY DEPARTMENT Provider Note   CSN: [id number] Arrival date & time: 11/21/19  7253     History Chief Complaint  Patient presents with   Altered Mental Status    LENE MCKAY is a 74 y.o. female.  The history is provided by the EMS personnel, medical records and a relative. No language interpreter was used.  Altered Mental Status  FAUSTINE TATES is a 74 y.o. female who presents to the Emergency Department complaining of altered mental status. Level V caveat due to altered mental status. She presents the emergency department by EMS from home for evaluation of change in mental status that began this morning. Per report she was talking on the phone at 650 this morning and was at her baseline mental status. When family called again at 8 AM she was confused and difficult to understand. Per reports she has experienced similar symptoms in the past related to UTI. She did have some coughing for EMS. Patient is unable to contribute any history.   Additional hx available after initial ED presentation from granddaughter at 1020.   At 615 she called her grandson and complained of jaw pain, headache, nausea.  Later she called and was only able to say help me, help me.  She sounded ill on the second time.  She had a similar admission in December, which was thought to be due to UTI and sepsis.    Past Medical History:  Diagnosis Date   Diabetes mellitus without complication (HCC)    GERD (gastroesophageal reflux disease)    Hypertension     Patient Active Problem List   Diagnosis Date Noted   Metabolic acidosis 11/21/2019   Sepsis (HCC) 03/14/2019   Diverticulitis 03/14/2019   Acute metabolic encephalopathy 03/13/2019   Essential hypertension 03/13/2019   AKI (acute kidney injury) (HCC) 03/13/2019   Type 2 diabetes mellitus without complication (HCC) 03/13/2019   Abdominal pain 03/27/2014   Leucocytosis 03/27/2014   Diabetes mellitus  type 2, controlled (HCC) 03/27/2014   Hyperlipidemia 03/27/2014   Fall 11/27/2012   Avulsed toenail 11/27/2012   Contusion of left knee 11/27/2012    Past Surgical History:  Procedure Laterality Date   ABDOMINAL HYSTERECTOMY     CHOLECYSTECTOMY       OB History   No obstetric history on file.     Family History  Problem Relation Age of Onset   Breast cancer Mother    Diabetes Mellitus II Neg Hx     Social History   Tobacco Use   Smoking status: Never Smoker   Smokeless tobacco: Never Used  Vaping Use   Vaping Use: Never used  Substance Use Topics   Alcohol use: No   Drug use: No    Home Medications Prior to Admission medications   Medication Sig Start Date End Date Taking? Authorizing Provider  acetaminophen (TYLENOL) 650 MG CR tablet Take 650 mg by mouth daily.    [provider]  atorvastatin (LIPITOR) 40 MG tablet Take 40 mg by mouth at bedtime.  12/24/13   [provider]  cimetidine (TAGAMET) 400 MG tablet Take 400 mg by mouth 2 (two) times daily. 11/16/19   [provider]  lisinopril (PRINIVIL,ZESTRIL) 20 MG tablet Take 20 mg by mouth daily.     [provider]  metFORMIN (GLUCOPHAGE) 1000 MG tablet Take 500 mg by mouth 2 (two) times daily with a meal.    [provider]  saccharomyces boulardii (FLORASTOR) 250 MG  capsule Take 250 mg by mouth daily.    [provider]  VITAMIN D PO Take 1 tablet by mouth daily.    [provider]    Allergies    Codeine, Contrast media [iodinated diagnostic agents], and Vistaril [hydroxyzine]  Review of Systems   Review of Systems  All other systems reviewed and are negative.   Physical Exam Updated Vital Signs BP (!) 147/60 (BP Location: Left Arm)    Pulse 85    Temp 100 F (37.8 C) (Rectal)    Resp (!) 22    Ht  (1.727 m)    Wt 112.4 kg    SpO2 100%    BMI 37.68 kg/m   Physical Exam Vitals and nursing note reviewed.  Constitutional:       General: She is in acute distress.     Appearance: She is well-developed. She is ill-appearing.     Comments: Uncomfortable appearing  HENT:     Head: Normocephalic and atraumatic.  Cardiovascular:     Rate and Rhythm: Normal rate and regular rhythm.     Heart sounds: No murmur heard.   Pulmonary:     Effort: Pulmonary effort is normal. No respiratory distress.     Breath sounds: Normal breath sounds.  Abdominal:     Palpations: Abdomen is soft.     Tenderness: There is no guarding or rebound.     Comments: Generalized abdominal tenderness  Musculoskeletal:     Comments: Tenderness to palpation in all four extremities.  Skin:    General: Skin is warm and dry.  Neurological:     Comments: Opens eyes to verbal commands. Moans. Nonverbal. Moves all extremities symmetrically.  Psychiatric:     Comments: Unable to assess     ED Results / Procedures / Treatments   Labs (all labs ordered are listed, but only abnormal results are displayed) Labs Reviewed  LACTIC ACID, PLASMA - Abnormal; Notable for the following components:      Result Value   Lactic Acid, Venous 2.0 (*)    All other components within normal limits  LACTIC ACID, PLASMA - Abnormal; Notable for the following components:   Lactic Acid, Venous 2.0 (*)    All other components within normal limits  COMPREHENSIVE METABOLIC PANEL - Abnormal; Notable for the following components:   CO2 21 (*)    Glucose, Bld 165 (*)    BUN 33 (*)    Creatinine, Ser 1.23 (*)    GFR calc non Af Amer 43 (*)    GFR calc Af Amer 50 (*)    Anion gap 16 (*)    All other components within normal limits  CBC WITH DIFFERENTIAL/PLATELET - Abnormal; Notable for the following components:   WBC 11.4 (*)    Neutro Abs 8.7 (*)    Abs Immature Granulocytes 0.09 (*)    All other components within normal limits  CBC - Abnormal; Notable for the following components:   WBC 11.7 (*)    All other components within normal limits  MAGNESIUM -  Abnormal; Notable for the following components:   Magnesium 1.6 (*)    All other components within normal limits  VITAMIN B12 - Abnormal; Notable for the following components:   Vitamin B-12 145 (*)    All other components within normal limits  HEMOGLOBIN A1C - Abnormal; Notable for the following components:   Hgb A1c MFr Bld 6.9 (*)    All other components within normal limits  I-STAT  CHEM 8, ED - Abnormal; Notable for the following components:   BUN 33 (*)    Creatinine, Ser 1.10 (*)    Glucose, Bld 164 (*)    Calcium, Ion 1.10 (*)    TCO2 21 (*)    All other components within normal limits  CBG MONITORING, ED - Abnormal; Notable for the following components:   Glucose-Capillary 156 (*)    All other components within normal limits  SARS CORONAVIRUS 2 BY RT PCR (HOSPITAL ORDER, PERFORMED IN [facility])  CULTURE, BLOOD (SINGLE)  URINE CULTURE  PROTIME-INR  APTT  URINALYSIS, ROUTINE W REFLEX MICROSCOPIC  LIPASE, BLOOD  APTT  PROTIME-INR  CREATININE, SERUM  PHOSPHORUS  TSH  FOLATE  ETHANOL  AMMONIA  RAPID URINE DRUG SCREEN, HOSP PERFORMED  CBC  COMPREHENSIVE METABOLIC PANEL  MAGNESIUM    EKG EKG Interpretation  Date/Time:  Wednesday November 21 2019 08:45:49 EDT Ventricular Rate:  79 PR Interval:    QRS Duration: 85 QT Interval:  395 QTC Calculation: 453 R Axis:   26 Text Interpretation: Sinus rhythm Probable left atrial enlargement Confirmed by Tilden Fossa [phone]) on 11/21/2019 9:42:56 AM   Radiology CT Head Wo Contrast  Result Date: 11/21/2019 CLINICAL DATA:  Altered mental status EXAM: CT HEAD WITHOUT CONTRAST TECHNIQUE: Contiguous axial images were obtained from the base of the skull through the vertex without intravenous contrast. COMPARISON:  Head CT March 13, 2019 and brain MRI March 15, 2019 FINDINGS: Brain: There is stable age related volume loss. There is no intracranial mass, hemorrhage, extra-axial fluid collection, or midline  shift. There is minimal periventricular small vessel disease. Elsewhere, there is decreased attenuation throughout much of the dentate nucleus of the right cerebellum compared to the left. This asymmetry was not appreciable previously. Vascular: No hyperdense vessel. There is calcification in each carotid siphon region. Skull: Bony calvarium appears intact. Sinuses/Orbits: Mucosal thickening noted in several ethmoid air cells. Other visualized paranasal sinuses are clear. Orbits appear symmetric bilaterally. Other: Mastoid air cells are clear. IMPRESSION: 1. Decreased attenuation throughout much of the right cerebellar dentate nucleus compared to the left side. Age uncertain white matter infarct in this area is questioned. 2. Slight periventricular small vessel disease. No mass or hemorrhage evident. 3.  Foci of arterial vascular calcification noted. 4.  Mucosal thickening noted in several ethmoid air cells. Electronically Signed   By: Bretta Bang III M.D.   On: 11/21/2019 10:02   MR BRAIN WO CONTRAST  Result Date: 11/21/2019 CLINICAL DATA:  Delirium.  Altered mental status. EXAM: MRI HEAD WITHOUT CONTRAST TECHNIQUE: Multiplanar, multiecho pulse sequences of the brain and surrounding structures were obtained without intravenous contrast. COMPARISON:  Head CT earlier same day.  MRI 03/15/2019. FINDINGS: Brain: Diffusion imaging does not show any acute or subacute infarction. Brainstem and cerebellum are normal. Cerebral hemispheres show a few small foci of T2 and FLAIR signal within the white matter consistent with minimal small vessel disease considering age. No cortical or large vessel territory infarction. No mass lesion, hemorrhage hydrocephalus or extra-axial collection. Vascular: Major vessels at the base of the brain show flow. Skull and upper cervical spine: Negative Sinuses/Orbits: Clear/normal Other: None IMPRESSION: No acute or reversible finding. Minimal small vessel change of the cerebral  hemispheric white matter, less than often seen at this age. Electronically Signed   By: Paulina Fusi M.D.   On: 11/21/2019 16:56   DG Chest Port 1 View  Result Date: 11/21/2019 CLINICAL DATA:  Cough. EXAM: PORTABLE  CHEST 1 VIEW COMPARISON:  March 13, 2019. FINDINGS: The heart size and mediastinal contours are within normal limits. Both lungs are clear. The visualized skeletal structures are unremarkable. IMPRESSION: No active disease. Electronically Signed   By: Lupita Raider M.D.   On: 11/21/2019 09:26    Procedures Procedures (including critical care time)  Medications Ordered in ED Medications  enoxaparin (LOVENOX) injection 40 mg (has no administration in time range)  0.9 %  sodium chloride infusion ( Intravenous New Bag/Given 11/21/19 1358)  acetaminophen (TYLENOL) tablet 650 mg (650 mg Oral Given 11/21/19 1346)    Or  acetaminophen (TYLENOL) suppository 650 mg ( Rectal See Alternative 11/21/19 1346)  ondansetron (ZOFRAN) tablet 4 mg ( Oral See Alternative 11/21/19 1346)    Or  ondansetron (ZOFRAN) injection 4 mg (4 mg Intravenous Given 11/21/19 1346)  albuterol (PROVENTIL) (2.5 MG/3ML) 0.083% nebulizer solution 2.5 mg (has no administration in time range)  hydrALAZINE (APRESOLINE) injection 10 mg (has no administration in time range)  insulin aspart (novoLOG) injection 0-15 Units (has no administration in time range)  insulin aspart (novoLOG) injection 0-5 Units (has no administration in time range)  LORazepam (ATIVAN) injection 1 mg (has no administration in time range)  magnesium sulfate IVPB 2 g 50 mL (has no administration in time range)  cefTRIAXone (ROCEPHIN) 2 g in sodium chloride 0.9 % 100 mL IVPB (0 g Intravenous Stopped 11/21/19 1158)  sodium chloride 0.9 % bolus 500 mL (0 mLs Intravenous Stopped 11/21/19 1400)    ED Course  I have reviewed the triage vital signs and the nursing notes.  Pertinent labs & imaging results that were available during my care of the patient  were reviewed by me and considered in my medical decision making (see chart for details).    MDM Rules/Calculators/A&P                         patient here for evaluation of altered mental status. She is confused, ill appearing on evaluation with no focal neurologic deficits. Given history of similar presentation with UTI she was empirically treated with Rocephin. UA returned eventually with no evidence of UTI. Chest x-ray with no evidence of pneumonia. Patient continues to be encephalopathy but does have improving mental status on reassessment. She does have a low-grade temperature, mild leukocytosis. No clear source of infection. Given change in mental status plan to admit for further evaluation and workup.  Final Clinical Impression(s) / ED Diagnoses Final diagnoses:  None    Rx / DC Orders ED Discharge Orders    None       Tilden Fossa, MD 11/21/19 [phone]

## 2019-11-21 NOTE — ED Triage Notes (Signed)
Pt arrives from home via EMS due to new onset AMS. Per ems, the patient spoke to her grandson this morning at 0615 and the patient was at her baseline (a&o x4). The granddaughter spoke to the pt again at around 0800 and she was confused and repeated several times that she didn't feel well. Family informed EMS that she's an occurrence like this before when she had a UTI.  Pt arrives confused, and only oriented to person.  20g IV to the right hand.  VITALS w/ EMS 84 hr 122 palp bp  170 CBG  Granddaughter's #: [phone]

## 2019-11-21 NOTE — ED Notes (Addendum)
Attempted to give report x2, nurse unavailable

## 2019-11-21 NOTE — ED Notes (Signed)
Attempted report x1; unable to provide. 3rd attempt

## 2019-11-21 NOTE — ED Notes (Signed)
Pt to MRI

## 2019-11-21 NOTE — H&P (Addendum)
History and Physical    Jasmine Cohen:[id number] DOB: 24-Feb-1946 DOA: 11/21/2019  PCP: Kaleen Mask, MD  Patient coming from: home  I have personally briefly reviewed patient's old medical records in [facility]  Chief Complaint: AMS  HPI: Jasmine Cohen is a 74 y.o. female with medical history significant of hypertension, hyperlipidemia, type 2 diabetes mellitus, morbid obesity presents to emergency department for evaluation of altered mental status.  Patient is poor historian.  History gathered from charts.  Apparently she was talking on the phone at 6:50 AM with her family and was at baseline and when family called her back at 8 AM she was confused and difficulty to understand.  As per family: Patient had similar symptoms when she had a UTI in the past-about 6 months ago.  I talked to patient's grandson: He tells me that this morning patient was complaining of not feeling well, headache and nausea therefore EMS was called and brought patient to the ER for further evaluation and management.  Upon my evaluation: Patient tells me that her legs and teeth are hurting.  As per family: Patient was complaining of headache, nausea, not feeling well overall.  No history of seizure, loss of consciousness, head trauma, fever, chills, chest pain, shortness of breath, leg swelling, vomiting, diarrhea, abdominal pain, urinary or bowel changes.  No history of smoking, alcohol, licit drug use.  ED Course: Upon arrival to ED: Patient tachypneic, blood pressure is elevated, fever of 100.0, CBC shows leukocytosis of 11.4, CMP shows AKI, UA negative, lipase: WNL, lactic acid: 2.0x2, COVID-19 negative, UC, BC: Pending, PT/INR: WNL, chest x-ray negative for acute findings, CT head negative for acute findings.  Patient received Rocephin 2 g and 500 cc of IV fluids in ED for the concern of UTI.  MRI brain is ordered and is pending.  [facility] hospitalist consulted for admission for acute metabolic  encephalopathy.  Review of Systems: As per HPI otherwise negative.    Past Medical History:  Diagnosis Date  . Diabetes mellitus without complication (HCC)   . GERD (gastroesophageal reflux disease)   . Hypertension     Past Surgical History:  Procedure Laterality Date  . ABDOMINAL HYSTERECTOMY    . CHOLECYSTECTOMY       reports that she has never smoked. She has never used smokeless tobacco. She reports that she does not drink alcohol and does not use drugs.  Allergies  Allergen Reactions  . Codeine     Numbness and tingling  . Contrast Media [Iodinated Diagnostic Agents] Hives  . Vistaril [Hydroxyzine] Other (See Comments)    hallucinations    Family History  Problem Relation Age of Onset  . Breast cancer Mother   . Diabetes Mellitus II Neg Hx     Prior to Admission medications   Medication Sig Start Date End Date Taking? Authorizing Provider  acetaminophen (TYLENOL) 650 MG CR tablet Take 650 mg by mouth daily.    [provider]  atorvastatin (LIPITOR) 40 MG tablet Take 40 mg by mouth at bedtime.  12/24/13   [provider]  cimetidine (TAGAMET) 400 MG tablet Take 400 mg by mouth 2 (two) times daily. 11/16/19   [provider]  lisinopril (PRINIVIL,ZESTRIL) 20 MG tablet Take 20 mg by mouth daily.     [provider]  metFORMIN (GLUCOPHAGE) 1000 MG tablet Take 500 mg by mouth 2 (two) times daily with a meal.    [provider]  saccharomyces boulardii (FLORASTOR) 250  MG capsule Take 250 mg by mouth daily.    [provider]  VITAMIN D PO Take 1 tablet by mouth daily.    [provider]    Physical Exam: Vitals:   11/21/19 1300 11/21/19 1500 11/21/19 1750 11/21/19 1800  BP: (!) 147/60 (!) 128/56 138/72 (!) 133/59  Pulse: 85 82 79 74  Resp: (!) 22 (!) 23 (!) 23 13  Temp:      TempSrc:      SpO2: 100% 96% 100% 100%  Weight:      Height:        Constitutional: NAD,, appears sick, on room air,  morbidly obese, no neck stiffness noted on exam. Eyes: PERRL, lids and conjunctivae normal ENMT: Mucous membranes are dry. Posterior pharynx clear of any exudate or lesions.Normal dentition.  Neck: normal, supple, no masses, no thyromegaly Respiratory: Tachypneic, no wheezing, rhonchi or crackles Cardiovascular: Regular rate and rhythm, no murmurs / rubs / gallops. No extremity edema. 2+ pedal pulses. No carotid bruits.  Abdomen: no tenderness, no masses palpated. No hepatosplenomegaly. Bowel sounds positive.  Musculoskeletal: no clubbing / cyanosis. No joint deformity upper and lower extremities. Good ROM, no contractures. Normal muscle tone.  Skin: no rashes, lesions, ulcers. No induration Neurologic: Sleepy but arousable and moving all extremities.  Not cooperative with exam.   Labs on Admission: I have personally reviewed following labs and imaging studies  CBC: Recent Labs  Lab 11/21/19 0909 11/21/19 0921 11/21/19 1402  WBC 11.4*  --  11.7*  NEUTROABS 8.7*  --   --   HGB 14.2 14.3 12.3  HCT 44.9 42.0 38.3  MCV 92.4  --  91.6  PLT 344  --  336   Basic Metabolic Panel: Recent Labs  Lab 11/21/19 0909 11/21/19 0921 11/21/19 1402  NA 139 138  --   K 4.4 4.2  --   CL 102 102  --   CO2 21*  --   --   GLUCOSE 165* 164*  --   BUN 33* 33*  --   CREATININE 1.23* 1.10* 0.90  CALCIUM 9.3  --   --   MG  --   --  1.6*  PHOS  --   --  2.7   GFR: Estimated Creatinine Clearance: 72.1 mL/min (by C-G formula based on SCr of 0.9 mg/dL). Liver Function Tests: Recent Labs  Lab 11/21/19 0909  AST 20  ALT 14  ALKPHOS 84  BILITOT 0.9  PROT 6.5  ALBUMIN 3.8   Recent Labs  Lab 11/21/19 0909  LIPASE 40   Recent Labs  Lab 11/21/19 1400  AMMONIA 21   Coagulation Profile: Recent Labs  Lab 11/21/19 0909 11/21/19 1130  INR 0.9 1.0   Cardiac Enzymes: No results for input(s): CKTOTAL, CKMB, CKMBINDEX, TROPONINI in the last 168 hours. BNP (last 3 results) No results for  input(s): PROBNP in the last 8760 hours. HbA1C: Recent Labs    11/21/19 1323  HGBA1C 6.9*   CBG: Recent Labs  Lab 11/21/19 1023 11/21/19 1747  GLUCAP 156* 122*   Lipid Profile: No results for input(s): CHOL, HDL, LDLCALC, TRIG, CHOLHDL, LDLDIRECT in the last 72 hours. Thyroid Function Tests: Recent Labs    11/21/19 1322  TSH 0.561   Anemia Panel: Recent Labs    11/21/19 1323 11/21/19 1402  VITAMINB12  --  145*  FOLATE 14.4  --    Urine analysis:    Component Value Date/Time   COLORURINE YELLOW 11/21/2019 0851   APPEARANCEUR  CLEAR 11/21/2019 0851   LABSPEC 1.019 11/21/2019 0851   PHURINE 8.0 11/21/2019 0851   GLUCOSEU NEGATIVE 11/21/2019 0851   HGBUR NEGATIVE 11/21/2019 0851   BILIRUBINUR NEGATIVE 11/21/2019 0851   KETONESUR NEGATIVE 11/21/2019 0851   PROTEINUR NEGATIVE 11/21/2019 0851   UROBILINOGEN 1.0 03/26/2014 2319   NITRITE NEGATIVE 11/21/2019 0851   LEUKOCYTESUR NEGATIVE 11/21/2019 0851    Radiological Exams on Admission: CT Head Wo Contrast  Result Date: 11/21/2019 CLINICAL DATA:  Altered mental status EXAM: CT HEAD WITHOUT CONTRAST TECHNIQUE: Contiguous axial images were obtained from the base of the skull through the vertex without intravenous contrast. COMPARISON:  Head CT March 13, 2019 and brain MRI March 15, 2019 FINDINGS: Brain: There is stable age related volume loss. There is no intracranial mass, hemorrhage, extra-axial fluid collection, or midline shift. There is minimal periventricular small vessel disease. Elsewhere, there is decreased attenuation throughout much of the dentate nucleus of the right cerebellum compared to the left. This asymmetry was not appreciable previously. Vascular: No hyperdense vessel. There is calcification in each carotid siphon region. Skull: Bony calvarium appears intact. Sinuses/Orbits: Mucosal thickening noted in several ethmoid air cells. Other visualized paranasal sinuses are clear. Orbits appear symmetric  bilaterally. Other: Mastoid air cells are clear. IMPRESSION: 1. Decreased attenuation throughout much of the right cerebellar dentate nucleus compared to the left side. Age uncertain white matter infarct in this area is questioned. 2. Slight periventricular small vessel disease. No mass or hemorrhage evident. 3.  Foci of arterial vascular calcification noted. 4.  Mucosal thickening noted in several ethmoid air cells. Electronically Signed   By: Bretta Bang III M.D.   On: 11/21/2019 10:02   MR BRAIN WO CONTRAST  Result Date: 11/21/2019 CLINICAL DATA:  Delirium.  Altered mental status. EXAM: MRI HEAD WITHOUT CONTRAST TECHNIQUE: Multiplanar, multiecho pulse sequences of the brain and surrounding structures were obtained without intravenous contrast. COMPARISON:  Head CT earlier same day.  MRI 03/15/2019. FINDINGS: Brain: Diffusion imaging does not show any acute or subacute infarction. Brainstem and cerebellum are normal. Cerebral hemispheres show a few small foci of T2 and FLAIR signal within the white matter consistent with minimal small vessel disease considering age. No cortical or large vessel territory infarction. No mass lesion, hemorrhage hydrocephalus or extra-axial collection. Vascular: Major vessels at the base of the brain show flow. Skull and upper cervical spine: Negative Sinuses/Orbits: Clear/normal Other: None IMPRESSION: No acute or reversible finding. Minimal small vessel change of the cerebral hemispheric white matter, less than often seen at this age. Electronically Signed   By: Paulina Fusi M.D.   On: 11/21/2019 16:56   DG Chest Port 1 View  Result Date: 11/21/2019 CLINICAL DATA:  Cough. EXAM: PORTABLE CHEST 1 VIEW COMPARISON:  March 13, 2019. FINDINGS: The heart size and mediastinal contours are within normal limits. Both lungs are clear. The visualized skeletal structures are unremarkable. IMPRESSION: No active disease. Electronically Signed   By: Lupita Raider M.D.   On:  11/21/2019 09:26    EKG: Independently reviewed.  Sinus rhythm, no ST elevation or depression noted.  Assessment/Plan Principal Problem:   Acute metabolic encephalopathy Active Problems:   Leucocytosis   Diabetes mellitus type 2, controlled (HCC)   Hyperlipidemia   Essential hypertension   AKI (acute kidney injury) (HCC)   Metabolic acidosis    Acute metabolic encephalopathy: -Unknown etiology.  Reviewed CT head result.  Upon arrival: Patient tachypneic, fever of 100.0, leukocytosis of 11.4, lactic acid: 2.0. lipase: WNL,  UA negative for infection, chest x-ray: Negative.  COVID-19 negative. -Patient received Rocephin and IV fluid in ED. -MRI brain is pending -Admit patient on the floor.  On telemetry. -Check B12, folate, TSH, ammonia, UDS, ethanol level, CK -Consult PT/OT/SLP. -We will keep her n.p.o. as patient is confused -Neuro checks every 4 hours.  Monitor vitals closely.  Continue IV fluids. -On fall/aspiration precautions  SIRS: -Unknown etiology?   -Patient is tachypneic, leukocytosis of 11.4 and lactic acid of 2.0.  -UA, chest x-ray, COVID-19: Negative, maintaining oxygen saturation on room air.  No wheezing noted on exam. -Patient received Rocephin in ED.  -Unlikely infectious cause-she is tachypneic likely secondary to anxiety and elevated lactic acid is likely secondary to dehydration.  We will continue IV fluids and hold off antibiotics at this time.  Trend lactic acid.  AKI:  -Creatinine: 1.23, GFR: 43. -Continue IV fluids.  Hold nephrotoxic medication-lisinopril and Metformin.  Repeat BMP tomorrow a.m.  Hypertension: Blood pressure is elevated -We will hold home p.o. meds as patient is confused.  Hydralazine as needed for blood pressure more than 160/100  Type 2 diabetes mellitus: Hold Metformin.  Started patient on sliding scale insulin and monitor blood sugar closely.  Check A1c  Hyperlipidemia: Hold statin  Hypomagnesemia: Replenished.  Repeat magnesium  level tomorrow a.m.  B12 deficiency: B12 1000mcg IM once given.  DVT prophylaxis: Lovenox/SCD Code Status: Full code-confirmed with patient's grandson Family Communication: None present at bedside.  Plan of care discussed with patient in length and he verbalized understanding and agreed with it.  I called patient's son and grandson.  Discussed plan of care and they verbalized understanding.  Disposition Plan: Home in 2 to 3 days Consults called: None Admission status: Inpatient   Ollen Bowlinka R Miller Limehouse MD [facility]  If 7PM-7AM, please contact night-coverage [email or web address] Password [email or web address]  11/21/2019, 6:48 PM

## 2019-11-22 DIAGNOSIS — I10 Essential (primary) hypertension: Secondary | ICD-10-CM

## 2019-11-22 DIAGNOSIS — E119 Type 2 diabetes mellitus without complications: Secondary | ICD-10-CM

## 2019-11-22 DIAGNOSIS — E872 Acidosis: Secondary | ICD-10-CM

## 2019-11-22 DIAGNOSIS — D72829 Elevated white blood cell count, unspecified: Secondary | ICD-10-CM | POA: Diagnosis not present

## 2019-11-22 DIAGNOSIS — G9341 Metabolic encephalopathy: Secondary | ICD-10-CM | POA: Diagnosis not present

## 2019-11-22 LAB — COMPREHENSIVE METABOLIC PANEL
ALT: 13 U/L (ref 0–44)
AST: 17 U/L (ref 15–41)
Albumin: 3 g/dL — ABNORMAL LOW (ref 3.5–5.0)
Alkaline Phosphatase: 69 U/L (ref 38–126)
Anion gap: 12 (ref 5–15)
BUN: 18 mg/dL (ref 8–23)
CO2: 21 mmol/L — ABNORMAL LOW (ref 22–32)
Calcium: 8.3 mg/dL — ABNORMAL LOW (ref 8.9–10.3)
Chloride: 106 mmol/L (ref 98–111)
Creatinine, Ser: 1 mg/dL (ref 0.44–1.00)
GFR calc Af Amer: >60 mL/min (ref >60)
GFR calc non Af Amer: 55 mL/min — ABNORMAL LOW (ref >60)
Glucose, Bld: 109 mg/dL — ABNORMAL HIGH (ref 70–99)
Potassium: 3.9 mmol/L (ref 3.5–5.1)
Sodium: 139 mmol/L (ref 135–145)
Total Bilirubin: 0.8 mg/dL (ref 0.3–1.2)
Total Protein: 5.5 g/dL — ABNORMAL LOW (ref 6.5–8.1)

## 2019-11-22 LAB — CBC
HCT: 37.2 % (ref 36.0–46.0)
Hemoglobin: 12 g/dL (ref 12.0–15.0)
MCH: 29.1 pg (ref 26.0–34.0)
MCHC: 32.3 g/dL (ref 30.0–36.0)
MCV: 90.1 fL (ref 80.0–100.0)
Platelets: 308 10*3/uL (ref 150–400)
RBC: 4.13 MIL/uL (ref 3.87–5.11)
RDW: 14 % (ref 11.5–15.5)
WBC: 7.8 10*3/uL (ref 4.0–10.5)
nRBC: 0 % (ref 0.0–0.2)

## 2019-11-22 LAB — GLUCOSE, CAPILLARY
Glucose-Capillary: 98 mg/dL (ref 70–99)
Glucose-Capillary: 232 mg/dL — ABNORMAL HIGH (ref 70–99)
Glucose-Capillary: 62 mg/dL — ABNORMAL LOW (ref 70–99)
Glucose-Capillary: 92 mg/dL (ref 70–99)
Glucose-Capillary: 110 mg/dL — ABNORMAL HIGH (ref 70–99)

## 2019-11-22 LAB — LACTIC ACID, PLASMA: Lactic Acid, Venous: 1 mmol/L (ref 0.5–1.9)

## 2019-11-22 LAB — URINE CULTURE: Culture: <10000 — AB

## 2019-11-22 LAB — MAGNESIUM: Magnesium: 1.9 mg/dL (ref 1.7–2.4)

## 2019-11-22 NOTE — TOC Initial Note (Signed)
Transition of Care [facility]) - Initial/Assessment Note    Patient Details  Name: Jasmine Cohen MRN: [id number] Date of Birth: Nov 12, 1945  Transition of Care [facility]) CM/SW Contact:    Kermit Balo, RN Phone Number: 11/22/2019, 3:33 PM  Clinical Narrative:                 Pt lives home alone but states she can stay at her grandsons home if needed.  Recommendations for [facility] services. CM provided choice and [facility] selected. Cory with [facility] accepted the referral.  3 in 1 ordered through [employer] and will be delivered to the room.  Pt has transportation home when medically ready.   Expected Discharge Plan: Home w Home Health Services Barriers to Discharge: Continued Medical Work up   Patient Goals and CMS Choice   CMS Medicare.gov Compare Post Acute Care list provided to:: Patient Choice offered to / list presented to : Patient  Expected Discharge Plan and Services Expected Discharge Plan: Home w Home Health Services   Discharge Planning Services: CM Consult Post Acute Care Choice: Home Health, Durable Medical Equipment Living arrangements for the past 2 months: Single Family Home                 DME Arranged: 3-N-1 DME Agency: [employer] Date DME Agency Contacted: 11/22/19   Representative spoke with at DME Agency: [facility] [employer] HH Arranged: PT, OT HH Agency: [facility] Date [facility] Agency Contacted: 11/22/19   Representative spoke with at [facility] Agency: Kandee Keen  Prior Living Arrangements/Services Living arrangements for the past 2 months: Single Family Home Lives with:: Self Patient language and need for interpreter reviewed:: Yes Do you feel safe going back to the place where you live?: Yes      Need for Family Participation in Patient Care: Yes (Comment) Care giver support system in place?: No (comment) Current home services: DME (walker/ rollator/ cane) Criminal Activity/Legal Involvement Pertinent to Current Situation/Hospitalization: No - Comment as  needed  Activities of Daily Living      Permission Sought/Granted                  Emotional Assessment Appearance:: Appears stated age Attitude/Demeanor/Rapport: Engaged Affect (typically observed): Accepting Orientation: : Oriented to Self, Oriented to Place, Oriented to Situation   Psych Involvement: No (comment)  Admission diagnosis:  Delirium [R41.0] Acute metabolic encephalopathy [G93.41] Patient Active Problem List   Diagnosis Date Noted  . Metabolic acidosis 11/21/2019  . Sepsis (HCC) 03/14/2019  . Diverticulitis 03/14/2019  . Acute metabolic encephalopathy 03/13/2019  . Essential hypertension 03/13/2019  . AKI (acute kidney injury) (HCC) 03/13/2019  . Type 2 diabetes mellitus without complication (HCC) 03/13/2019  . Abdominal pain 03/27/2014  . Leucocytosis 03/27/2014  . Diabetes mellitus type 2, controlled (HCC) 03/27/2014  . Hyperlipidemia 03/27/2014  . Fall 11/27/2012  . Avulsed toenail 11/27/2012  . Contusion of left knee 11/27/2012   PCP:  Kaleen Mask, MD Pharmacy:   [facility] [address] [place]), [place] - [address] [place] ([place]) [place] [address] Phone: [phone] Fax: [phone]     Social Determinants of Health (SDOH) Interventions    Readmission Risk Interventions No flowsheet data found.

## 2019-11-22 NOTE — Evaluation (Signed)
Physical Therapy Evaluation Patient Details Name: Jasmine Cohen MRN: [id number] DOB: 10-28-1945 Today's Date: 11/22/2019   History of Present Illness  74 y.o. female with medical history significant of hypertension, hyperlipidemia, type 2 diabetes, and morbid obesity. Presents to ED for altered mental status secondary to acute metabolic encephalophathy.    Clinical Impression  Pt admitted with above diagnosis. PTA pt lived alone, mod I using cane. On eval, she required supervision bed mobility, min guard assist transfers, and min guard assist ambulation 150' with RW. Pt with c/o RLE pain which she reports in chronic. Pt is very HOH which somewhat hinders cognitive assessment. Cognition does appear WFL. Pt will benefit from skilled PT to increase their independence and safety with mobility to allow discharge to the venue listed below.  Recommend 3n1. Pt reports her 3n1 broke and she has been unable to replace it. She utilizes it as a bedside commode at night.     Follow Up Recommendations Home health PT;Supervision - Intermittent    Equipment Recommendations  3in1 (PT)    Recommendations for Other Services       Precautions / Restrictions Precautions Precautions: Fall      Mobility  Bed Mobility Overal bed mobility: Needs Assistance Bed Mobility: Supine to Sit     Supine to sit: HOB elevated;Supervision     General bed mobility comments: +rail, supervision for safety  Transfers Overall transfer level: Needs assistance Equipment used: Ambulation equipment used Transfers: Sit to/from Stand;Stand Pivot Transfers Sit to Stand: Min guard Stand pivot transfers: Min guard       General transfer comment: increased time, min guard for safety  Ambulation/Gait Ambulation/Gait assistance: Min guard Gait Distance (Feet): 150 Feet Assistive device: Rolling walker (2 wheeled) Gait Pattern/deviations: Step-through pattern;Decreased stride length Gait velocity: decreased Gait  velocity interpretation: <1.31 ft/sec, indicative of household ambulator General Gait Details: steady gait with RW, min guard assist for safety.  Stairs            Wheelchair Mobility    Modified Rankin (Stroke Patients Only)       Balance Overall balance assessment: Needs assistance Sitting-balance support: No upper extremity supported;Feet supported Sitting balance-Leahy Scale: Good     Standing balance support: Bilateral upper extremity supported;During functional activity;No upper extremity supported Standing balance-Leahy Scale: Fair Standing balance comment: static stand without UE support. RW for amb due to RLE pain.                             Pertinent Vitals/Pain Pain Assessment: Faces Faces Pain Scale: Hurts even more Pain Location: RLE during ambulation (chronic) Pain Descriptors / Indicators: Spasm;Grimacing;Discomfort;Sharp Pain Intervention(s): Monitored during session;Repositioned    Home Living Family/patient expects to be discharged to:: Private residence Living Arrangements: Alone (Pt able to stay with family, if needed.) Available Help at Discharge: Family;Available PRN/intermittently Type of Home: Mobile home Home Access: Ramped entrance     Home Layout: One level Home Equipment: Walker - 4 wheels;Cane - single point;Walker - 2 wheels      Prior Function Level of Independence: Independent with assistive device(s)         Comments: cane at baseline. Primarily sponge bathes. Drives.     Hand Dominance   Dominant Hand: Right    Extremity/Trunk Assessment   Upper Extremity Assessment Upper Extremity Assessment: Defer to OT evaluation    Lower Extremity Assessment Lower Extremity Assessment: Generalized weakness       Communication  Communication: HOH  Cognition Arousal/Alertness: Awake/alert Behavior During Therapy: WFL for tasks assessed/performed Overall Cognitive Status: Within Functional Limits for tasks  assessed                                        General Comments General comments (skin integrity, edema, etc.): VSS. HR 63    Exercises     Assessment/Plan    PT Assessment Patient needs continued PT services  PT Problem List Decreased strength;Decreased mobility;Pain;Decreased balance;Decreased activity tolerance       PT Treatment Interventions Therapeutic activities;Gait training;Therapeutic exercise;Patient/family education;Balance training;Functional mobility training    PT Goals (Current goals can be found in the Care Plan section)  Acute Rehab PT Goals Patient Stated Goal: home PT Goal Formulation: With patient Time For Goal Achievement: 12/06/19 Potential to Achieve Goals: Good    Frequency Min 3X/week   Barriers to discharge        Co-evaluation               AM-PAC PT "6 Clicks" Mobility  Outcome Measure Help needed turning from your back to your side while in a flat bed without using bedrails?: None Help needed moving from lying on your back to sitting on the side of a flat bed without using bedrails?: A Little Help needed moving to and from a bed to a chair (including a wheelchair)?: A Little Help needed standing up from a chair using your arms (e.g., wheelchair or bedside chair)?: A Little Help needed to walk in hospital room?: A Little Help needed climbing 3-5 steps with a railing? : A Lot 6 Click Score: 18    End of Session Equipment Utilized During Treatment: Gait belt Activity Tolerance: Patient tolerated treatment well Patient left: in chair;with chair alarm set;with call bell/phone within reach Nurse Communication: Mobility status PT Visit Diagnosis: Difficulty in walking, not elsewhere classified (R26.2);Pain;Muscle weakness (generalized) (M62.81) Pain - Right/Left: Right Pain - part of body: Leg    Time: 7353-2992 PT Time Calculation (min) (ACUTE ONLY): 28 min   Charges:   PT Evaluation $PT Eval Moderate  Complexity: 1 Mod PT Treatments $Gait Training: 8-22 mins        Aida Raider, PT  Office # [phone] Pager [phone]   Jasmine Cohen 11/22/2019, 9:31 AM

## 2019-11-22 NOTE — Evaluation (Signed)
Clinical/Bedside Swallow Evaluation Patient Details  Name: NAEEMAH JASMER MRN: [id number] Date of Birth: [birth year]  Today's Date: 11/22/2019 Time: SLP Start Time (ACUTE ONLY): 9675 SLP Stop Time (ACUTE ONLY): 0851 SLP Time Calculation (min) (ACUTE ONLY): 8 min  Past Medical History:  Past Medical History:  Diagnosis Date  . Diabetes mellitus without complication (HCC)   . GERD (gastroesophageal reflux disease)   . Hypertension    Past Surgical History:  Past Surgical History:  Procedure Laterality Date  . ABDOMINAL HYSTERECTOMY    . CHOLECYSTECTOMY     HPI:  Pt is a 74 y.o. female with medical history significant of hypertension, hyperlipidemia, type 2 diabetes, and morbid obesity. Presents to ED for altered mental status secondary to acute metabolic encephalophathy. During neurologic exam pt was tired and not cooperative but was arousable and moving all extremeties. Pt currently NPO due to confusion. Chest port 1 view unremarkable. Head CT shows decreased attenuation throughout much of the right cerebellar dentate nucleus compared to the left side. MRI unremarkable.    Assessment / Plan / Recommendation Clinical Impression  Pt demonstrated immediate cough after initial presentation of thin liquid. Repeated trials afterward revealed no signs of aspiration. Appears to have regular swallow function. Recommend regular diet and thin liquids. No SLP f/u needed. SLP Visit Diagnosis: Dysphagia, unspecified (R13.10)    Aspiration Risk  Mild aspiration risk    Diet Recommendation Regular;Thin liquid   Liquid Administration via: Cup;Straw Medication Administration: Whole meds with liquid Supervision: Patient able to self feed Compensations: Small sips/bites;Slow rate Postural Changes: Seated upright at 90 degrees    Other  Recommendations Oral Care Recommendations: Oral care BID   Follow up Recommendations None      Frequency and Duration            Prognosis         Swallow Study   General Date of Onset: 11/21/19 HPI: Pt is a 74 y.o. female with medical history significant of hypertension, hyperlipidemia, type 2 diabetes, and morbid obesity. Presents to ED for altered mental status secondary to acute metabolic encephalophathy. During neurologic exam pt was tired and not cooperative but was arousable and moving all extremeties. Pt currently NPO due to confusion. Chest port 1 view unremarkable. Head CT shows decreased attenuation throughout much of the right cerebellar dentate nucleus compared to the left side. MRI unremarkable.  Type of Study: Bedside Swallow Evaluation Diet Prior to this Study: NPO Temperature Spikes Noted: No Respiratory Status: Room air History of Recent Intubation: No Behavior/Cognition: Alert;Cooperative;Confused Oral Cavity Assessment: Within Functional Limits Oral Care Completed by SLP: No Oral Cavity - Dentition: Adequate natural dentition Vision: Functional for self-feeding Self-Feeding Abilities: Able to feed self Patient Positioning: Upright in bed Baseline Vocal Quality: Normal    Oral/Motor/Sensory Function Overall Oral Motor/Sensory Function: Within functional limits   Ice Chips Ice chips: Within functional limits Presentation: Spoon   Thin Liquid Thin Liquid: Impaired Presentation: Cup;Straw;Self Fed Pharyngeal  Phase Impairments: Cough - Immediate Other Comments: immediate cough after initial presentation of thin liquid    Nectar Thick     Honey Thick     Puree Puree: Within functional limits Presentation: Self Fed;Spoon   Solid     Solid: Within functional limits Presentation: Self Fed      Royetta Crochet 11/22/2019,9:12 AM

## 2019-11-22 NOTE — Progress Notes (Signed)
PROGRESS NOTE    Jasmine Cohen  WHQ:[id number] DOB: 08-09-1945 DOA: 11/21/2019 PCP: Kaleen Mask, MD    Chief Complaint  Patient presents with  . Altered Mental Status    Brief Narrative: 74 year old lady prior history of hypertension, hyperlipidemia, type 2 diabetes, obesity presents to ED for evaluation of confusion and altered mental status.  On arrival to ED patient was tachypneic temp of 100, mild leukocytosis.  No signs of infection but lactic acid was elevated.  Blood cultures were drawn and negative so far.  CT of the head is negative for acute intracranial abnormalities.  MRI of the brain does not show an acute stroke.  COVID-19 screening test is negative.  She was given a dose of Rocephin in the ED.  She was admitted for further evaluation. On exam today patient is alert and oriented.  She reports she had another episode where she could remember where she was last night.  Assessment & Plan:   Principal Problem:   Acute metabolic encephalopathy Active Problems:   Leucocytosis   Diabetes mellitus type 2, controlled (HCC)   Hyperlipidemia   Essential hypertension   AKI (acute kidney injury) (HCC)   Metabolic acidosis   Acute metabolic encephalopathy probably secondary to dehydration. Lactic acid normalized.  Urine analysis negative for infection.  Chest x-ray is negative for pneumonia.  CT of the head and MRI is negative for an acute stroke. CK is 66.  TSH within normal limits.  Ethyl alcohol level is negative Patient currently is alert and oriented and answering all questions appropriately.   SIRS SIRS physiology has resolved. No source of infection found so far Awaiting blood culture report.    Essential hypertension Well-controlled.    Type 2 diabetes mellitus Controlled Hemoglobin A1c at 6.9% Continue sliding scale insulin. CBG (last 3)  Recent Labs    11/21/19 2312 11/22/19 0604 11/22/19 1125  GLUCAP 94 98 232*     Mild AKI with  non-anion gap metabolic acidosis Probably secondary to dehydration. Creatinine improved with her lactic acid improved with IV fluids. Recommend continue IV fluids for another 20 hours and recheck creatinine in the morning.    Due to her deconditioning and debility, PT evaluation ordered recommended home health PT on discharge.   Hypomagnesemia Replaced   DVT prophylaxis: (Lovenox) Code Status: (FULL CODE.  Family Communication:  Discussed with the grand daughter over the phone.  Disposition:   Status is: Inpatient  Remains inpatient appropriate because:IV treatments appropriate due to intensity of illness or inability to take PO   Dispo: The patient is from: Home              Anticipated d/c is to: Home              Anticipated d/c date is: 1 day              Patient currently is not medically stable to d/c.       Consultants:   Curb side with neurology.    Procedures: MRI brain without contrast.    Antimicrobials: one dose of rocephin.    Subjective: Alert and oriented. Comfortable, no new complaints.   Objective: Vitals:   11/22/19 0434 11/22/19 0804 11/22/19 1229 11/22/19 1238  BP:  132/64 (!) 128/57 129/63  Pulse:  64 70 63  Resp:  18  14  Temp:  (!) 97.5 F (36.4 C) 98.2 F (36.8 C) 98.5 F (36.9 C)  TempSrc:  Oral Oral Oral  SpO2:  100% 100% 100%  Weight: 112 kg     Height:        Intake/Output Summary (Last 24 hours) at 11/22/2019 1520 Last data filed at 11/22/2019 0528 Gross per 24 hour  Intake 1512.92 ml  Output 400 ml  Net 1112.92 ml   Filed Weights   11/21/19 0902 11/21/19 2114 11/22/19 0434  Weight: 112.4 kg 109.3 kg 112 kg    Examination:  General exam: Appears calm and comfortable  Respiratory system: Clear to auscultation. Respiratory effort normal. Cardiovascular system: S1 & S2 heard, RRR. No JVD,. No pedal edema. Gastrointestinal system: Abdomen is nondistended, soft and nontender.  Normal bowel sounds heard. Central  nervous system: Alert and oriented. No focal neurological deficits. Extremities: Symmetric 5 x 5 power. Skin: No rashes, lesions or ulcers Psychiatry:  Mood & affect appropriate.     Data Reviewed: I have personally reviewed following labs and imaging studies  CBC: Recent Labs  Lab 11/21/19 0909 11/21/19 0921 11/21/19 1402 11/22/19 0633  WBC 11.4*  --  11.7* 7.8  NEUTROABS 8.7*  --   --   --   HGB 14.2 14.3 12.3 12.0  HCT 44.9 42.0 38.3 37.2  MCV 92.4  --  91.6 90.1  PLT 344  --  336 308    Basic Metabolic Panel: Recent Labs  Lab 11/21/19 0909 11/21/19 0921 11/21/19 1402 11/22/19 0633  NA 139 138  --  139  K 4.4 4.2  --  3.9  CL 102 102  --  106  CO2 21*  --   --  21*  GLUCOSE 165* 164*  --  109*  BUN 33* 33*  --  18  CREATININE 1.23* 1.10* 0.90 1.00  CALCIUM 9.3  --   --  8.3*  MG  --   --  1.6* 1.9  PHOS  --   --  2.7  --     GFR: Estimated Creatinine Clearance: 64.7 mL/min (by C-G formula based on SCr of 1 mg/dL).  Liver Function Tests: Recent Labs  Lab 11/21/19 0909 11/22/19 0633  AST 20 17  ALT 14 13  ALKPHOS 84 69  BILITOT 0.9 0.8  PROT 6.5 5.5*  ALBUMIN 3.8 3.0*    CBG: Recent Labs  Lab 11/21/19 1023 11/21/19 1747 11/21/19 2312 11/22/19 0604 11/22/19 1125  GLUCAP 156* 122* 94 98 232*     Recent Results (from the past 240 hour(s))  Blood culture (routine single)     Status: None (Preliminary result)   Collection Time: 11/21/19  9:09 AM   Specimen: BLOOD  Result Value Ref Range Status   Specimen Description BLOOD SITE NOT SPECIFIED  Final   Special Requests   Final    BOTTLES DRAWN AEROBIC AND ANAEROBIC Blood Culture results may not be optimal due to an inadequate volume of blood received in culture bottles   Culture   Final    NO GROWTH < 24 HOURS Performed at [facility], [address] [place], [place] [address]    Report Status PENDING  Incomplete  SARS Coronavirus 2 by RT PCR (hospital order, performed in [facility] hospital lab) Nasopharyngeal Nasopharyngeal Swab     Status: None   Collection Time: 11/21/19  9:10 AM   Specimen: Nasopharyngeal Swab  Result Value Ref Range Status   SARS Coronavirus 2 NEGATIVE NEGATIVE Final    Comment: (NOTE) SARS-CoV-2 target nucleic acids are NOT DETECTED.  The SARS-CoV-2 RNA is generally detectable in upper and lower  respiratory specimens during the acute phase of infection. The lowest concentration of SARS-CoV-2 viral copies this assay can detect is 250 copies / mL. A negative result does not preclude SARS-CoV-2 infection and should not be used as the sole basis for treatment or other patient management decisions.  A negative result may occur with improper specimen collection / handling, submission of specimen other than nasopharyngeal swab, presence of viral mutation(s) within the areas targeted by this assay, and inadequate number of viral copies (<250 copies / mL). A negative result must be combined with clinical observations, patient history, and epidemiological information.  Fact Sheet for Patients:   BoilerBrush.com.cyhttps://www.fda.gov/media/136312/download  Fact Sheet for Healthcare Providers: https://pope.com/https://www.fda.gov/media/136313/download  This test is not yet approved or  cleared by the [place] FDA and has been authorized for detection and/or diagnosis of SARS-CoV-2 by FDA under an Emergency Use Authorization (EUA).  This EUA will remain in effect (meaning this test can be used) for the duration of the COVID-19 declaration under Section 564(b)(1) of the Act, 21 U.S.C. section 360bbb-3(b)(1), unless the authorization is terminated or revoked sooner.  Performed at [facility], [address] [place], [place] [address]   Urine culture     Status: Abnormal   Collection Time: 11/21/19 10:28 AM   Specimen: In/Out Cath Urine  Result Value Ref Range Status   Specimen Description IN/OUT CATH URINE  Final   Special Requests NONE  Final   Culture (A)   Final    <10,000 COLONIES/mL INSIGNIFICANT GROWTH Performed at [facility], [address] [place], [place] [address]    Report Status 11/22/2019 FINAL  Final  Culture, blood (routine x 2)     Status: None (Preliminary result)   Collection Time: 11/21/19 10:40 AM   Specimen: BLOOD  Result Value Ref Range Status   Specimen Description BLOOD RIGHT ANTECUBITAL  Final   Special Requests   Final    BOTTLES DRAWN AEROBIC AND ANAEROBIC Blood Culture results may not be optimal due to an inadequate volume of blood received in culture bottles   Culture   Final    NO GROWTH < 12 HOURS Performed at [facility], [address] [place], [place] [address]    Report Status PENDING  Incomplete         Radiology Studies: CT Head Wo Contrast  Result Date: 11/21/2019 CLINICAL DATA:  Altered mental status EXAM: CT HEAD WITHOUT CONTRAST TECHNIQUE: Contiguous axial images were obtained from the base of the skull through the vertex without intravenous contrast. COMPARISON:  Head CT March 13, 2019 and brain MRI March 15, 2019 FINDINGS: Brain: There is stable age related volume loss. There is no intracranial mass, hemorrhage, extra-axial fluid collection, or midline shift. There is minimal periventricular small vessel disease. Elsewhere, there is decreased attenuation throughout much of the dentate nucleus of the right cerebellum compared to the left. This asymmetry was not appreciable previously. Vascular: No hyperdense vessel. There is calcification in each carotid siphon region. Skull: Bony calvarium appears intact. Sinuses/Orbits: Mucosal thickening noted in several ethmoid air cells. Other visualized paranasal sinuses are clear. Orbits appear symmetric bilaterally. Other: Mastoid air cells are clear. IMPRESSION: 1. Decreased attenuation throughout much of the right cerebellar dentate nucleus compared to the left side. Age uncertain white matter infarct in this area is questioned. 2. Slight  periventricular small vessel disease. No mass or hemorrhage evident. 3.  Foci of arterial vascular calcification noted. 4.  Mucosal thickening noted in several  ethmoid air cells. Electronically Signed   By: Bretta Bang III M.D.   On: 11/21/2019 10:02   MR BRAIN WO CONTRAST  Result Date: 11/21/2019 CLINICAL DATA:  Delirium.  Altered mental status. EXAM: MRI HEAD WITHOUT CONTRAST TECHNIQUE: Multiplanar, multiecho pulse sequences of the brain and surrounding structures were obtained without intravenous contrast. COMPARISON:  Head CT earlier same day.  MRI 03/15/2019. FINDINGS: Brain: Diffusion imaging does not show any acute or subacute infarction. Brainstem and cerebellum are normal. Cerebral hemispheres show a few small foci of T2 and FLAIR signal within the white matter consistent with minimal small vessel disease considering age. No cortical or large vessel territory infarction. No mass lesion, hemorrhage hydrocephalus or extra-axial collection. Vascular: Major vessels at the base of the brain show flow. Skull and upper cervical spine: Negative Sinuses/Orbits: Clear/normal Other: None IMPRESSION: No acute or reversible finding. Minimal small vessel change of the cerebral hemispheric white matter, less than often seen at this age. Electronically Signed   By: Paulina Fusi M.D.   On: 11/21/2019 16:56   DG Chest Port 1 View  Result Date: 11/21/2019 CLINICAL DATA:  Cough. EXAM: PORTABLE CHEST 1 VIEW COMPARISON:  March 13, 2019. FINDINGS: The heart size and mediastinal contours are within normal limits. Both lungs are clear. The visualized skeletal structures are unremarkable. IMPRESSION: No active disease. Electronically Signed   By: Lupita Raider M.D.   On: 11/21/2019 09:26        Scheduled Meds: . enoxaparin (LOVENOX) injection  40 mg Subcutaneous Q24H  . insulin aspart  0-15 Units Subcutaneous TID WC  . insulin aspart  0-5 Units Subcutaneous QHS   Continuous Infusions: . sodium  chloride 100 mL/hr at 11/22/19 1504     LOS: 1 day        Kathlen Mody, MD [facility]   To contact the attending provider between 7A-7P or the covering provider during after hours 7P-7A, please log into the web site [email or web address] and access using universal [place] password for that web site. If you do not have the password, please call the hospital operator.  11/22/2019, 3:20 PM

## 2019-11-22 NOTE — Evaluation (Signed)
Occupational Therapy Evaluation Patient Details Name: Jasmine Cohen MRN: [id number] DOB: March 15, 1946 Today's Date: 11/22/2019    History of Present Illness 74 y.o. female with medical history significant of hypertension, hyperlipidemia, type 2 diabetes, and morbid obesity. Presents to ED for altered mental status secondary to acute metabolic encephalophathy.   Clinical Impression   PTA patient independent, driving and completing all IADLs (med mgmt).  Admitted for above and limited by problem list below, including decreased activity tolerance, impaired balance, generalized weakness and questionable cognition.  Patient alert and oriented, following simple commands but noted decreased awareness and problem solving during session. Recommend further functional cognition testing using pill box test.   Patient currently requires min guard to supervision for ADLs, min guard for transfers and limited in room mobility. Believe she will benefit from further OT services while admitted and after dc at [facility] level to optimize independence and safety with ADLs, IADLs and mobility.     Follow Up Recommendations  Home health OT;Supervision/Assistance - 24 hour    Equipment Recommendations  None recommended by OT    Recommendations for Other Services       Precautions / Restrictions Precautions Precautions: Fall Restrictions Weight Bearing Restrictions: No      Mobility Bed Mobility Overal bed mobility: Needs Assistance Bed Mobility: Supine to Sit     Supine to sit: HOB elevated;Supervision     General bed mobility comments: EOB upon entry   Transfers Overall transfer level: Needs assistance Equipment used: Ambulation equipment used Transfers: Sit to/from Stand Sit to Stand: Min guard Stand pivot transfers: Min guard       General transfer comment: for safety and balance     Balance Overall balance assessment: Needs assistance Sitting-balance support: No upper extremity  supported;Feet supported Sitting balance-Leahy Scale: Good     Standing balance support: No upper extremity supported;During functional activity Standing balance-Leahy Scale: Fair Standing balance comment: static stand without UE support. RW for amb due to RLE pain.                           ADL either performed or assessed with clinical judgement   ADL Overall ADL's : Needs assistance/impaired     Grooming: Supervision/safety;Standing   Upper Body Bathing: Set up;Sitting   Lower Body Bathing: Min guard;Sit to/from stand   Upper Body Dressing : Set up;Sitting   Lower Body Dressing: Min guard;Sit to/from stand Lower Body Dressing Details (indicate cue type and reason): able to manage socks, min guard sit to stand  Toilet Transfer: Min guard;Ambulation Toilet Transfer Details (indicate cue type and reason): simulated in room          Functional mobility during ADLs: Min guard;Cueing for safety General ADL Comments: pt with decreased activity tolerance, questionable cognition      Vision Baseline Vision/History: Wears glasses Wears Glasses: At all times Patient Visual Report: No change from baseline       Perception     Praxis      Pertinent Vitals/Pain Pain Assessment: Faces Faces Pain Scale: No hurt Pain Location: RLE during ambulation (chronic) Pain Descriptors / Indicators: Spasm;Grimacing;Discomfort;Sharp Pain Intervention(s): Monitored during session;Repositioned     Hand Dominance Right   Extremity/Trunk Assessment Upper Extremity Assessment Upper Extremity Assessment: Generalized weakness   Lower Extremity Assessment Lower Extremity Assessment: Defer to PT evaluation       Communication Communication Communication: HOH   Cognition Arousal/Alertness: Awake/alert Behavior During Therapy: [facility] for tasks assessed/performed  Overall Cognitive Status: Difficult to assess                                 General Comments:  difficult to assess due to [facility], but patient able to follow simple commands , decrased problem sovling and awareness noted; would benefit from further functional assesment with pill box test    General Comments  VSS     Exercises     Shoulder Instructions      Home Living Family/patient expects to be discharged to:: Private residence Living Arrangements: Alone (able to stay with family as needed ) Available Help at Discharge: Family;Available PRN/intermittently Type of Home: Mobile home Home Access: Ramped entrance     Home Layout: One level     Bathroom Shower/Tub: Chief Strategy Officer: Standard     Home Equipment: Environmental consultant - 4 wheels;Cane - single point;Walker - 2 wheels;Shower seat   Additional Comments: at times sponge bathes, but typically takes showers      Prior Functioning/Environment Level of Independence: Independent with assistive device(s)        Comments: cane at baseline. drives. manages meds and completes IADLs.         OT Problem List: Decreased strength;Decreased activity tolerance;Impaired balance (sitting and/or standing);Decreased safety awareness;Decreased knowledge of use of DME or AE;Decreased knowledge of precautions      OT Treatment/Interventions: Self-care/ADL training;Therapeutic exercise;DME and/or AE instruction;Therapeutic activities;Cognitive remediation/compensation;Patient/family education;Other (comment)    OT Goals(Current goals can be found in the care plan section) Acute Rehab OT Goals Patient Stated Goal: home OT Goal Formulation: With patient Time For Goal Achievement: 12/06/19 Potential to Achieve Goals: Good  OT Frequency: Min 2X/week   Barriers to D/C:            Co-evaluation              AM-PAC OT "6 Clicks" Daily Activity     Outcome Measure Help from another person eating meals?: None Help from another person taking care of personal grooming?: A Little Help from another person toileting, which  includes using toliet, bedpan, or urinal?: A Little Help from another person bathing (including washing, rinsing, drying)?: A Little Help from another person to put on and taking off regular upper body clothing?: A Little Help from another person to put on and taking off regular lower body clothing?: A Little 6 Click Score: 19   End of Session Nurse Communication: Mobility status  Activity Tolerance: Patient tolerated treatment well Patient left: with call bell/phone within reach;with bed alarm set;Other (comment) (seated EOB )  OT Visit Diagnosis: Unsteadiness on feet (R26.81);Muscle weakness (generalized) (M62.81)                Time: 1610-9604 OT Time Calculation (min): 23 min Charges:  OT General Charges $OT Visit: 1 Visit OT Evaluation $OT Eval Moderate Complexity: 1 Mod OT Treatments $Self Care/Home Management : 8-22 mins  Barry Brunner, OT Acute Rehabilitation Services Pager [phone] Office [phone]   Chancy Milroy 11/22/2019, 1:21 PM

## 2019-11-22 NOTE — Progress Notes (Signed)
Pt admitted to the unit from ED. Pt A&O x4, Pt oriented to the unit and room; telemetry applied and verified with CCMD,f NT called to second verify. Skin intact with no pressure ulcer or opened wounds. Pt in bed with call light within reach. Will continue to closely monitor. Arabella Merles Oluwaseun Cremer RN.   11/21/19 2114  Vitals  Temp 97.8 F (36.6 C)  Temp Source Oral  BP 127/67  MAP (mmHg) 83  BP Location Left Arm  BP Method Automatic  Patient Position (if appropriate) Lying  Pulse Rate 72  Pulse Rate Source Dinamap  Resp 18  Level of Consciousness  Level of Consciousness Alert  MEWS COLOR  MEWS Score Color Green  Oxygen Therapy  SpO2 96 %  O2 Device Room Air  Pain Assessment  Pain Scale 0-10  Pain Score 0  Height and Weight  Height 5\' 8"  (1.727 m)  Weight 109.3 kg  Type of Scale Used Bed  Type of Weight Actual  BSA (Calculated - sq m) 2.29 sq meters  BMI (Calculated) 36.65  Weight in (lb) to have BMI = 25 164.1  ECG Monitoring  Cardiac Rhythm NSR  Telemetry Box Number [id number]  Tele Box Verification Completed by Second Verifier Completed  MEWS Score  MEWS Temp 0  MEWS Systolic 0  MEWS Pulse 0  MEWS RR 0  MEWS LOC 0  MEWS Score 0

## 2019-11-23 DIAGNOSIS — N179 Acute kidney failure, unspecified: Secondary | ICD-10-CM | POA: Diagnosis not present

## 2019-11-23 DIAGNOSIS — G9341 Metabolic encephalopathy: Secondary | ICD-10-CM | POA: Diagnosis not present

## 2019-11-23 DIAGNOSIS — E785 Hyperlipidemia, unspecified: Secondary | ICD-10-CM

## 2019-11-23 DIAGNOSIS — E119 Type 2 diabetes mellitus without complications: Secondary | ICD-10-CM | POA: Diagnosis not present

## 2019-11-23 DIAGNOSIS — G9340 Encephalopathy, unspecified: Secondary | ICD-10-CM | POA: Diagnosis present

## 2019-11-23 DIAGNOSIS — R410 Disorientation, unspecified: Secondary | ICD-10-CM | POA: Diagnosis not present

## 2019-11-23 LAB — BASIC METABOLIC PANEL
Anion gap: 11 (ref 5–15)
BUN: 16 mg/dL (ref 8–23)
CO2: 24 mmol/L (ref 22–32)
Calcium: 9.1 mg/dL (ref 8.9–10.3)
Chloride: 104 mmol/L (ref 98–111)
Creatinine, Ser: 1.02 mg/dL — ABNORMAL HIGH (ref 0.44–1.00)
GFR calc Af Amer: >60 mL/min (ref >60)
GFR calc non Af Amer: 54 mL/min — ABNORMAL LOW (ref >60)
Glucose, Bld: 126 mg/dL — ABNORMAL HIGH (ref 70–99)
Potassium: 4.4 mmol/L (ref 3.5–5.1)
Sodium: 139 mmol/L (ref 135–145)

## 2019-11-23 LAB — GLUCOSE, CAPILLARY
Glucose-Capillary: 106 mg/dL — ABNORMAL HIGH (ref 70–99)
Glucose-Capillary: 161 mg/dL — ABNORMAL HIGH (ref 70–99)

## 2019-11-23 MED ORDER — LISINOPRIL 20 MG PO TABS
20.0000 mg | ORAL_TABLET | Freq: Every day | ORAL | Status: DC
  Administered 2019-11-23: 20 mg via ORAL
  Filled 2019-11-23: qty 1

## 2019-11-23 NOTE — Care Management Obs Status (Signed)
MEDICARE OBSERVATION STATUS NOTIFICATION   Patient Details  Name: Jasmine Cohen MRN: [id number] Date of Birth: 23-Feb-1946   Medicare Observation Status Notification Given:  Yes    Baldemar Lenis, LCSW 11/23/2019, 1:16 PM

## 2019-11-23 NOTE — Progress Notes (Signed)
Pt discharged to home with 3:1.  Picked up by granddaughter.  Has all belongings with her.  Understands all discharge instructions.  Verbalizes no complaints.

## 2019-11-23 NOTE — Discharge Summary (Signed)
Physician Discharge Summary  Jasmine BosworthLucille P Cohen ZOX:[id number] DOB: 05/29/1945 DOA: 11/21/2019  PCP: Jasmine MaskElkins, Jasmine Oliver, MD  Admit date: 11/21/2019 Discharge date: 11/23/2019  Admitted From: Home Disposition:  Home.   Recommendations for Outpatient Follow-up:  1. Follow up with PCP in 1-2 weeks 2. Please obtain BMP/CBC in one week 3. Please follow up with neurology for dementia work up.   Home Health: YES   Discharge Condition: STABLE.  CODE STATUS:FULL CODE.  Diet recommendation: Heart Healthy   Brief/Interim Summary:  74 year old lady prior history of hypertension, hyperlipidemia, type 2 diabetes, obesity presents to ED for evaluation of confusion and altered mental status.  On arrival to ED patient was tachypneic temp of 100, mild leukocytosis.  No signs of infection but lactic acid was elevated.  Blood cultures were drawn and negative so far.  CT of the head is negative for acute intracranial abnormalities.  MRI of the brain does not show an acute stroke.  COVID-19 screening test is negative.  She was given a dose of Rocephin in the ED.  She was admitted for further evaluation.  She was found to be dehydrated and in mild AKI. She was given fluids and her symptoms resolved.   Discharge Diagnoses:  Principal Problem:   Acute metabolic encephalopathy Active Problems:   Leucocytosis   Diabetes mellitus type 2, controlled (HCC)   Hyperlipidemia   Essential hypertension   AKI (acute kidney injury) (HCC)   Metabolic acidosis   Acute metabolic encephalopathy probably secondary to dehydration. Lactic acid normalized.  Urine analysis negative for infection.  Chest x-ray is negative for pneumonia.  CT of the head and MRI is negative for an acute stroke. CK is 66.  TSH within normal limits.  Ethyl alcohol level is negative Patient currently is alert and oriented and answering all questions appropriately.   SIRS SIRS physiology has resolved. No source of infection found so  far Negative blood cultures     Essential hypertension Sub optimally controlled, restarted home meds.     Type 2 diabetes mellitus Controlled Hemoglobin A1c at 6.9% Continue sliding scale insulin. CBG (last 3)  Recent Labs    11/22/19 1710 11/22/19 2120 11/23/19 0609  GLUCAP 92 110* 106*      Mild AKI with non-anion gap metabolic acidosis Probably secondary to dehydration. Creatinine improved with her lactic acid improved with IV fluids. Bicarb normalized.     Due to her deconditioning and debility, PT evaluation ordered recommended home health PT on discharge.   Hypomagnesemia Replaced    Discharge Instructions  Discharge Instructions    Diet - low sodium heart healthy   Complete by: As directed    Discharge instructions   Complete by: As directed    Follow up with PCP and need a referral for neurology for extensive dementia work up.     Allergies as of 11/23/2019      Reactions   Codeine Other (See Comments)   Numbness and tingling   Contrast Media [iodinated Diagnostic Agents] Hives   Vistaril [hydroxyzine] Other (See Comments)   hallucinations      Medication List    TAKE these medications   acetaminophen 650 MG CR tablet Commonly known as: TYLENOL Take 650 mg by mouth as needed for pain.   atorvastatin 40 MG tablet Commonly known as: LIPITOR Take 40 mg by mouth at bedtime.   cimetidine 400 MG tablet Commonly known as: TAGAMET Take 400 mg by mouth 2 (two) times daily.   lisinopril 20 MG  tablet Commonly known as: ZESTRIL Take 20 mg by mouth daily.   metFORMIN 1000 MG tablet Commonly known as: GLUCOPHAGE Take 500 mg by mouth 2 (two) times daily with a meal.   saccharomyces boulardii 250 MG capsule Commonly known as: FLORASTOR Take 250 mg by mouth daily.   VITAMIN D PO Take 1 tablet by mouth daily.            Durable Medical Equipment  (From admission, onward)         Start     Ordered   11/22/19 1438   For home use only DME 3 n 1  Once        11/22/19 1438          Follow-up Information    [facility] Follow up.   Specialty: Home Health Services Why: The home health agency will contact you for the first appt. Contact information: [address] [place] [address] [phone]        Jasmine Mask, MD Follow up on 11/26/2019.   Specialty: Family Medicine Why: Your appt is at 10:00.  Contact information: [address] [place] [address] [phone]              Allergies  Allergen Reactions  . Codeine Other (See Comments)    Numbness and tingling  . Contrast Media [Iodinated Diagnostic Agents] Hives  . Vistaril [Hydroxyzine] Other (See Comments)    hallucinations    Consultations:  None.   Procedures/Studies: CT Head Wo Contrast  Result Date: 11/21/2019 CLINICAL DATA:  Altered mental status EXAM: CT HEAD WITHOUT CONTRAST TECHNIQUE: Contiguous axial images were obtained from the base of the skull through the vertex without intravenous contrast. COMPARISON:  Head CT March 13, 2019 and brain MRI March 15, 2019 FINDINGS: Brain: There is stable age related volume loss. There is no intracranial mass, hemorrhage, extra-axial fluid collection, or midline shift. There is minimal periventricular small vessel disease. Elsewhere, there is decreased attenuation throughout much of the dentate nucleus of the right cerebellum compared to the left. This asymmetry was not appreciable previously. Vascular: No hyperdense vessel. There is calcification in each carotid siphon region. Skull: Bony calvarium appears intact. Sinuses/Orbits: Mucosal thickening noted in several ethmoid air cells. Other visualized paranasal sinuses are clear. Orbits appear symmetric bilaterally. Other: Mastoid air cells are clear. IMPRESSION: 1. Decreased attenuation throughout much of the right cerebellar dentate nucleus compared to the left side. Age uncertain  white matter infarct in this area is questioned. 2. Slight periventricular small vessel disease. No mass or hemorrhage evident. 3.  Foci of arterial vascular calcification noted. 4.  Mucosal thickening noted in several ethmoid air cells. Electronically Signed   By: Bretta Bang III M.D.   On: 11/21/2019 10:02   MR BRAIN WO CONTRAST  Result Date: 11/21/2019 CLINICAL DATA:  Delirium.  Altered mental status. EXAM: MRI HEAD WITHOUT CONTRAST TECHNIQUE: Multiplanar, multiecho pulse sequences of the brain and surrounding structures were obtained without intravenous contrast. COMPARISON:  Head CT earlier same day.  MRI 03/15/2019. FINDINGS: Brain: Diffusion imaging does not show any acute or subacute infarction. Brainstem and cerebellum are normal. Cerebral hemispheres show a few small foci of T2 and FLAIR signal within the white matter consistent with minimal small vessel disease considering age. No cortical or large vessel territory infarction. No mass lesion, hemorrhage hydrocephalus or extra-axial collection. Vascular: Major vessels at the base of the brain show flow. Skull and upper cervical spine: Negative  Sinuses/Orbits: Clear/normal Other: None IMPRESSION: No acute or reversible finding. Minimal small vessel change of the cerebral hemispheric white matter, less than often seen at this age. Electronically Signed   By: Paulina Fusi M.D.   On: 11/21/2019 16:56   DG Chest Port 1 View  Result Date: 11/21/2019 CLINICAL DATA:  Cough. EXAM: PORTABLE CHEST 1 VIEW COMPARISON:  March 13, 2019. FINDINGS: The heart size and mediastinal contours are within normal limits. Both lungs are clear. The visualized skeletal structures are unremarkable. IMPRESSION: No active disease. Electronically Signed   By: Lupita Raider M.D.   On: 11/21/2019 09:26      Subjective: No new complaints.   Discharge Exam: Vitals:   11/23/19 0323 11/23/19 0827  BP: (!) 162/75 (!) 160/74  Pulse: 68 63  Resp: 16 16  Temp: 98.7  F (37.1 C) 98 F (36.7 C)  SpO2: 100% 99%   Vitals:   11/22/19 1938 11/22/19 2330 11/23/19 0323 11/23/19 0827  BP: (!) 144/64 (!) 173/72 (!) 162/75 (!) 160/74  Pulse: 67 63 68 63  Resp: Temp: 98.4 F (36.9 C) 98 F (36.7 C) 98.7 F (37.1 C) 98 F (36.7 C)  TempSrc: Oral Oral  Oral  SpO2: 100% 100% 100% 99%  Weight:      Height:        General: Pt is alert, awake, not in acute distress Cardiovascular: RRR, S1/S2 +, no rubs, no gallops Respiratory: CTA bilaterally, no wheezing, no rhonchi Abdominal: Soft, NT, ND, bowel sounds + Extremities: no edema, no cyanosis    The results of significant diagnostics from this hospitalization (including imaging, microbiology, ancillary and laboratory) are listed below for reference.     Microbiology: Recent Results (from the past 240 hour(s))  Blood culture (routine single)     Status: None (Preliminary result)   Collection Time: 11/21/19  9:09 AM   Specimen: BLOOD  Result Value Ref Range Status   Specimen Description BLOOD SITE NOT SPECIFIED  Final   Special Requests   Final    BOTTLES DRAWN AEROBIC AND ANAEROBIC Blood Culture results may not be optimal due to an inadequate volume of blood received in culture bottles   Culture   Final    NO GROWTH 2 DAYS Performed at [facility] Lab, [address], [place], [place] [address]    Report Status PENDING  Incomplete  SARS Coronavirus 2 by RT PCR (hospital order, performed in [facility] hospital lab) Nasopharyngeal Nasopharyngeal Swab     Status: None   Collection Time: 11/21/19  9:10 AM   Specimen: Nasopharyngeal Swab  Result Value Ref Range Status   SARS Coronavirus 2 NEGATIVE NEGATIVE Final    Comment: (NOTE) SARS-CoV-2 target nucleic acids are NOT DETECTED.  The SARS-CoV-2 RNA is generally detectable in upper and lower respiratory specimens during the acute phase of infection. The lowest concentration of SARS-CoV-2 viral copies this assay can detect is  250 copies / mL. A negative result does not preclude SARS-CoV-2 infection and should not be used as the sole basis for treatment or other patient management decisions.  A negative result may occur with improper specimen collection / handling, submission of specimen other than nasopharyngeal swab, presence of viral mutation(s) within the areas targeted by this assay, and inadequate number of viral copies (<250 copies / mL). A negative result must be combined with clinical observations, patient history, and epidemiological information.  Fact Sheet for Patients:   BoilerBrush.com.cy  Fact  Sheet for Healthcare Providers: https://pope.com/  This test is not yet approved or  cleared by the [place] and has been authorized for detection and/or diagnosis of SARS-CoV-2 by FDA under an Emergency Use Authorization (EUA).  This EUA will remain in effect (meaning this test can be used) for the duration of the COVID-19 declaration under Section 564(b)(1) of the Act, 21 U.S.C. section 360bbb-3(b)(1), unless the authorization is terminated or revoked sooner.  Performed at [facility], [address] [place], [place] [address]   Urine culture     Status: Abnormal   Collection Time: 11/21/19 10:28 AM   Specimen: In/Out Cath Urine  Result Value Ref Range Status   Specimen Description IN/OUT CATH URINE  Final   Special Requests NONE  Final   Culture (A)  Final    <10,000 COLONIES/mL INSIGNIFICANT GROWTH Performed at [facility] Lab, [address] [place], [place] [address]    Report Status 11/22/2019 FINAL  Final  Culture, blood (routine x 2)     Status: None (Preliminary result)   Collection Time: 11/21/19 10:40 AM   Specimen: BLOOD  Result Value Ref Range Status   Specimen Description BLOOD RIGHT ANTECUBITAL  Final   Special Requests   Final    BOTTLES DRAWN AEROBIC AND ANAEROBIC Blood Culture results may not be optimal  due to an inadequate volume of blood received in culture bottles   Culture   Final    NO GROWTH 2 DAYS Performed at [facility] Lab, [address], [place], [place] [address]    Report Status PENDING  Incomplete     Labs: BNP (last 3 results) No results for input(s): BNP in the last 8760 hours. Basic Metabolic Panel: Recent Labs  Lab 11/21/19 0909 11/21/19 0921 11/21/19 1402 11/22/19 0633 11/23/19 0858  NA 139 138  --  139 139  K 4.4 4.2  --  3.9 4.4  CL 102 102  --  106 104  CO2 21*  --   --  21* 24  GLUCOSE 165* 164*  --  109* 126*  BUN 33* 33*  --  18 16  CREATININE 1.23* 1.10* 0.90 1.00 1.02*  CALCIUM 9.3  --   --  8.3* 9.1  MG  --   --  1.6* 1.9  --   PHOS  --   --  2.7  --   --    Liver Function Tests: Recent Labs  Lab 11/21/19 0909 11/22/19 0633  AST 20 17  ALT 14 13  ALKPHOS 84 69  BILITOT 0.9 0.8  PROT 6.5 5.5*  ALBUMIN 3.8 3.0*   Recent Labs  Lab 11/21/19 0909  LIPASE 40   Recent Labs  Lab 11/21/19 1400  AMMONIA 21   CBC: Recent Labs  Lab 11/21/19 0909 11/21/19 0921 11/21/19 1402 11/22/19 0633  WBC 11.4*  --  11.7* 7.8  NEUTROABS 8.7*  --   --   --   HGB 14.2 14.3 12.3 12.0  HCT 44.9 42.0 38.3 37.2  MCV 92.4  --  91.6 90.1  PLT 344  --  336 308   Cardiac Enzymes: Recent Labs  Lab 11/21/19 1809  CKTOTAL 66   BNP: Invalid input(s): POCBNP CBG: Recent Labs  Lab 11/22/19 1125 11/22/19 1615 11/22/19 1710 11/22/19 2120 11/23/19 0609  GLUCAP 232* 62* 92 110* 106*   D-Dimer No results for input(s): DDIMER in the last 72 hours. Hgb A1c Recent Labs    11/21/19  1323  HGBA1C 6.9*   Lipid Profile No results for input(s): CHOL, HDL, LDLCALC, TRIG, CHOLHDL, LDLDIRECT in the last 72 hours. Thyroid function studies Recent Labs    11/21/19 1322  TSH 0.561   Anemia work up Recent Labs    11/21/19 1323 11/21/19 1402  VITAMINB12  --  145*  FOLATE 14.4  --    Urinalysis    Component Value Date/Time   COLORURINE  YELLOW 11/21/2019 0851   APPEARANCEUR CLEAR 11/21/2019 0851   LABSPEC 1.019 11/21/2019 0851   PHURINE 8.0 11/21/2019 0851   GLUCOSEU NEGATIVE 11/21/2019 0851   HGBUR NEGATIVE 11/21/2019 0851   BILIRUBINUR NEGATIVE 11/21/2019 0851   KETONESUR NEGATIVE 11/21/2019 0851   PROTEINUR NEGATIVE 11/21/2019 0851   UROBILINOGEN 1.0 03/26/2014 2319   NITRITE NEGATIVE 11/21/2019 0851   LEUKOCYTESUR NEGATIVE 11/21/2019 0851   Sepsis Labs Invalid input(s): PROCALCITONIN,  WBC,  LACTICIDVEN Microbiology Recent Results (from the past 240 hour(s))  Blood culture (routine single)     Status: None (Preliminary result)   Collection Time: 11/21/19  9:09 AM   Specimen: BLOOD  Result Value Ref Range Status   Specimen Description BLOOD SITE NOT SPECIFIED  Final   Special Requests   Final    BOTTLES DRAWN AEROBIC AND ANAEROBIC Blood Culture results may not be optimal due to an inadequate volume of blood received in culture bottles   Culture   Final    NO GROWTH 2 DAYS Performed at [facility] Lab, [address] [place], [place] [address]    Report Status PENDING  Incomplete  SARS Coronavirus 2 by RT PCR (hospital order, performed in [facility] hospital lab) Nasopharyngeal Nasopharyngeal Swab     Status: None   Collection Time: 11/21/19  9:10 AM   Specimen: Nasopharyngeal Swab  Result Value Ref Range Status   SARS Coronavirus 2 NEGATIVE NEGATIVE Final    Comment: (NOTE) SARS-CoV-2 target nucleic acids are NOT DETECTED.  The SARS-CoV-2 RNA is generally detectable in upper and lower respiratory specimens during the acute phase of infection. The lowest concentration of SARS-CoV-2 viral copies this assay can detect is 250 copies / mL. A negative result does not preclude SARS-CoV-2 infection and should not be used as the sole basis for treatment or other patient management decisions.  A negative result may occur with improper specimen collection / handling, submission of specimen other than  nasopharyngeal swab, presence of viral mutation(s) within the areas targeted by this assay, and inadequate number of viral copies (<250 copies / mL). A negative result must be combined with clinical observations, patient history, and epidemiological information.  Fact Sheet for Patients:   BoilerBrush.com.cy  Fact Sheet for Healthcare Providers: https://pope.com/  This test is not yet approved or  cleared by the [place] FDA and has been authorized for detection and/or diagnosis of SARS-CoV-2 by FDA under an Emergency Use Authorization (EUA).  This EUA will remain in effect (meaning this test can be used) for the duration of the COVID-19 declaration under Section 564(b)(1) of the Act, 21 U.S.C. section 360bbb-3(b)(1), unless the authorization is terminated or revoked sooner.  Performed at [facility], [address], [place], [place] [address]   Urine culture     Status: Abnormal   Collection Time: 11/21/19 10:28 AM   Specimen: In/Out Cath Urine  Result Value Ref Range Status   Specimen Description IN/OUT CATH URINE  Final   Special Requests NONE  Final   Culture (A)  Final    <  10,000 COLONIES/mL INSIGNIFICANT GROWTH Performed at [facility], [address], [place], [place] [address]    Report Status 11/22/2019 FINAL  Final  Culture, blood (routine x 2)     Status: None (Preliminary result)   Collection Time: 11/21/19 10:40 AM   Specimen: BLOOD  Result Value Ref Range Status   Specimen Description BLOOD RIGHT ANTECUBITAL  Final   Special Requests   Final    BOTTLES DRAWN AEROBIC AND ANAEROBIC Blood Culture results may not be optimal due to an inadequate volume of blood received in culture bottles   Culture   Final    NO GROWTH 2 DAYS Performed at [facility] Lab, [address] [place], [place] [address]    Report Status PENDING  Incomplete     Time coordinating discharge:35 minutes.    SIGNED:   Kathlen Mody, MD  [facility] 11/23/2019, 10:43 AM

## 2019-11-23 NOTE — TOC Transition Note (Signed)
Transition of Care [facility]) - CM/SW Discharge Note   Patient Details  Name: Jasmine Cohen MRN: [id number] Date of Birth: [birth year]  [facility] of [facility]) CM/SW Contact:  Baldemar Lenis, LCSW Phone Number: 11/23/2019, 10:00 AM   Clinical Narrative:   Patient discharging home. CSW alerted [facility] of discharge today.     Final next level of care: Home w Home Health Services Barriers to Discharge: Barriers Resolved   Patient Goals and CMS Choice   CMS Medicare.gov Compare Post Acute Care list provided to:: Patient Choice offered to / list presented to : Patient  Discharge Placement                       Discharge Plan and Services   Discharge Planning Services: CM Consult Post Acute Care Choice: Home Health, Durable Medical Equipment          DME Arranged: 3-N-1 DME Agency: [employer] Date DME Agency Contacted: 11/22/19   Representative spoke with at DME Agency: [employer] HH Arranged: PT, OT HH Agency: [facility] Date [facility] Agency Contacted: 11/22/19   Representative spoke with at [facility] Agency: Kandee Keen  Social Determinants of Health (SDOH) Interventions     Readmission Risk Interventions No flowsheet data found.

## 2019-11-23 NOTE — Care Management CC44 (Signed)
Condition Code 44 Documentation Completed  Patient Details  Name: Jasmine Cohen MRN: [id number] Date of Birth: [birth year]   Condition Code 44 given:  Yes Patient signature on Condition Code 44 notice:  Yes Documentation of 2 MD's agreement:  Yes Code 44 added to claim:  Yes    Baldemar Lenis, LCSW 11/23/2019, 1:16 PM

## 2019-11-26 LAB — CULTURE, BLOOD (SINGLE): Culture: NO GROWTH

## 2019-11-26 LAB — CULTURE, BLOOD (ROUTINE X 2): Culture: NO GROWTH

## 2020-03-05 ENCOUNTER — Emergency Department (HOSPITAL_COMMUNITY)
Admission: EM | Admit: 2020-03-05 | Discharge: 2020-03-06 | Disposition: A | Discharge status: Home / Self Care | Payer: Medicare PPO | Attending: Emergency Medicine | Admitting: Emergency Medicine

## 2020-03-05 ENCOUNTER — Emergency Department (HOSPITAL_COMMUNITY): Payer: Medicare PPO

## 2020-03-05 ENCOUNTER — Other Ambulatory Visit: Payer: Self-pay

## 2020-03-05 DIAGNOSIS — R1114 Bilious vomiting: Secondary | ICD-10-CM | POA: Insufficient documentation

## 2020-03-05 DIAGNOSIS — K219 Gastro-esophageal reflux disease without esophagitis: Secondary | ICD-10-CM | POA: Insufficient documentation

## 2020-03-05 DIAGNOSIS — R0789 Other chest pain: Secondary | ICD-10-CM | POA: Diagnosis not present

## 2020-03-05 DIAGNOSIS — Z20822 Contact with and (suspected) exposure to covid-19: Secondary | ICD-10-CM | POA: Insufficient documentation

## 2020-03-05 DIAGNOSIS — R1011 Right upper quadrant pain: Secondary | ICD-10-CM | POA: Diagnosis not present

## 2020-03-05 DIAGNOSIS — Z7984 Long term (current) use of oral hypoglycemic drugs: Secondary | ICD-10-CM | POA: Diagnosis not present

## 2020-03-05 DIAGNOSIS — I10 Essential (primary) hypertension: Secondary | ICD-10-CM | POA: Diagnosis not present

## 2020-03-05 DIAGNOSIS — Z79899 Other long term (current) drug therapy: Secondary | ICD-10-CM | POA: Insufficient documentation

## 2020-03-05 DIAGNOSIS — E119 Type 2 diabetes mellitus without complications: Secondary | ICD-10-CM | POA: Insufficient documentation

## 2020-03-05 LAB — BASIC METABOLIC PANEL
Anion gap: 14 (ref 5–15)
BUN: 20 mg/dL (ref 8–23)
CO2: 22 mmol/L (ref 22–32)
Calcium: 9.5 mg/dL (ref 8.9–10.3)
Chloride: 105 mmol/L (ref 98–111)
Creatinine, Ser: 1.15 mg/dL — ABNORMAL HIGH (ref 0.44–1.00)
GFR, Estimated: 50 mL/min — ABNORMAL LOW (ref >60)
Glucose, Bld: 120 mg/dL — ABNORMAL HIGH (ref 70–99)
Potassium: 4.1 mmol/L (ref 3.5–5.1)
Sodium: 141 mmol/L (ref 135–145)

## 2020-03-05 LAB — HEPATIC FUNCTION PANEL
ALT: 18 U/L (ref 0–44)
AST: 22 U/L (ref 15–41)
Albumin: 3.7 g/dL (ref 3.5–5.0)
Alkaline Phosphatase: 91 U/L (ref 38–126)
Bilirubin, Direct: <0.1 mg/dL (ref 0.0–0.2)
Total Bilirubin: 0.7 mg/dL (ref 0.3–1.2)
Total Protein: 6.3 g/dL — ABNORMAL LOW (ref 6.5–8.1)

## 2020-03-05 LAB — CBC
HCT: 44.1 % (ref 36.0–46.0)
Hemoglobin: 14.3 g/dL (ref 12.0–15.0)
MCH: 29.2 pg (ref 26.0–34.0)
MCHC: 32.4 g/dL (ref 30.0–36.0)
MCV: 90.2 fL (ref 80.0–100.0)
Platelets: 374 10*3/uL (ref 150–400)
RBC: 4.89 MIL/uL (ref 3.87–5.11)
RDW: 13 % (ref 11.5–15.5)
WBC: 9.5 10*3/uL (ref 4.0–10.5)
nRBC: 0 % (ref 0.0–0.2)

## 2020-03-05 LAB — URINALYSIS, ROUTINE W REFLEX MICROSCOPIC
Bilirubin Urine: NEGATIVE
Glucose, UA: NEGATIVE mg/dL
Hgb urine dipstick: NEGATIVE
Ketones, ur: 5 mg/dL — AB
Leukocytes,Ua: NEGATIVE
Nitrite: NEGATIVE
Protein, ur: NEGATIVE mg/dL
Specific Gravity, Urine: 1.018 (ref 1.005–1.030)
pH: 6 (ref 5.0–8.0)

## 2020-03-05 LAB — RESP PANEL BY RT-PCR (FLU A&B, COVID) ARPGX2
Influenza A by PCR: NEGATIVE
Influenza B by PCR: NEGATIVE
SARS Coronavirus 2 by RT PCR: NEGATIVE

## 2020-03-05 LAB — D-DIMER, QUANTITATIVE: D-Dimer, Quant: 1.74 ug/mL-FEU — ABNORMAL HIGH (ref 0.00–0.50)

## 2020-03-05 LAB — LIPASE, BLOOD: Lipase: 26 U/L (ref 11–51)

## 2020-03-05 LAB — SEDIMENTATION RATE: Sed Rate: 10 mm/hr (ref 0–22)

## 2020-03-05 LAB — TROPONIN I (HIGH SENSITIVITY)
Troponin I (High Sensitivity): 3 ng/L (ref <18)
Troponin I (High Sensitivity): 3 ng/L (ref <18)

## 2020-03-05 MED ORDER — HYDROCORTISONE NA SUCCINATE PF 250 MG IJ SOLR
200.0000 mg | Freq: Once | INTRAMUSCULAR | Status: AC
  Administered 2020-03-05: 200 mg via INTRAVENOUS
  Filled 2020-03-05: qty 200

## 2020-03-05 MED ORDER — DIPHENHYDRAMINE HCL 25 MG PO CAPS: 50.0000 mg | ORAL_CAPSULE | Freq: Once | ORAL | Status: DC

## 2020-03-05 MED ORDER — FENTANYL CITRATE (PF) 100 MCG/2ML IJ SOLN
50.0000 ug | Freq: Once | INTRAMUSCULAR | Status: AC
  Administered 2020-03-05: 50 ug via INTRAVENOUS
  Filled 2020-03-05: qty 2

## 2020-03-05 MED ORDER — SODIUM CHLORIDE 0.9 % IV BOLUS
500.0000 mL | Freq: Once | INTRAVENOUS | Status: AC
  Administered 2020-03-05: 500 mL via INTRAVENOUS

## 2020-03-05 MED ORDER — ONDANSETRON HCL 4 MG/2ML IJ SOLN
4.0000 mg | Freq: Once | INTRAMUSCULAR | Status: AC
  Administered 2020-03-05: 4 mg via INTRAVENOUS
  Filled 2020-03-05: qty 2

## 2020-03-05 MED ORDER — FENTANYL CITRATE (PF) 100 MCG/2ML IJ SOLN
25.0000 ug | Freq: Once | INTRAMUSCULAR | Status: AC
  Administered 2020-03-05: 25 ug via INTRAVENOUS
  Filled 2020-03-05: qty 2

## 2020-03-05 MED ORDER — DIPHENHYDRAMINE HCL 50 MG/ML IJ SOLN: 50.0000 mg | Freq: Once | INTRAMUSCULAR | Status: DC

## 2020-03-05 NOTE — ED Provider Notes (Signed)
[facility] EMERGENCY DEPARTMENT Provider Note   CSN: [id number] Arrival date & time: 03/05/20  1105     History Chief Complaint  Patient presents with  . Chest Pain    Jasmine Cohen is a 74 y.o. female.  HPI Patient presents with concern of pain in the left inframammary area, left upper quadrant, radiating posteriorly. Onset was today, about 4 AM which is 12 hours ago. She notes that she was in her usual state of health prior to this.  This includes yesterday being outside hanging Christmas decorations. She does have some suspicion she overexerted herself. Today, after onset she has had pain persistently, sore, severe, persistent, not substantial improved with aspirin, nitroglycerin. No dyspnea. There is associated nausea, vomiting, but no diarrhea.     Past Medical History:  Diagnosis Date  . Diabetes mellitus without complication (HCC)   . GERD (gastroesophageal reflux disease)   . Hypertension     Patient Active Problem List   Diagnosis Date Noted  . Acute encephalopathy 11/23/2019  . Metabolic acidosis 11/21/2019  . Sepsis (HCC) 03/14/2019  . Diverticulitis 03/14/2019  . Acute metabolic encephalopathy 03/13/2019  . Essential hypertension 03/13/2019  . AKI (acute kidney injury) (HCC) 03/13/2019  . Type 2 diabetes mellitus without complication (HCC) 03/13/2019  . Abdominal pain 03/27/2014  . Leucocytosis 03/27/2014  . Diabetes mellitus type 2, controlled (HCC) 03/27/2014  . Hyperlipidemia 03/27/2014  . Fall 11/27/2012  . Avulsed toenail 11/27/2012  . Contusion of left knee 11/27/2012    Past Surgical History:  Procedure Laterality Date  . ABDOMINAL HYSTERECTOMY    . CHOLECYSTECTOMY       OB History   No obstetric history on file.     Family History  Problem Relation Age of Onset  . Breast cancer Mother   . Diabetes Mellitus II Neg Hx     Social History   Tobacco Use  . Smoking status: Never Smoker  . Smokeless tobacco:  Never Used  Vaping Use  . Vaping Use: Never used  Substance Use Topics  . Alcohol use: No  . Drug use: No    Home Medications Prior to Admission medications   Medication Sig Start Date End Date Taking? Authorizing Provider  acetaminophen (TYLENOL) 650 MG CR tablet Take 650 mg by mouth as needed for pain.     [provider]  atorvastatin (LIPITOR) 40 MG tablet Take 40 mg by mouth at bedtime.  12/24/13   [provider]  cimetidine (TAGAMET) 400 MG tablet Take 400 mg by mouth 2 (two) times daily. 11/16/19   [provider]  lisinopril (PRINIVIL,ZESTRIL) 20 MG tablet Take 20 mg by mouth daily.     [provider]  metFORMIN (GLUCOPHAGE) 1000 MG tablet Take 500 mg by mouth 2 (two) times daily with a meal.    [provider]  saccharomyces boulardii (FLORASTOR) 250 MG capsule Take 250 mg by mouth daily.    [provider]  VITAMIN D PO Take 1 tablet by mouth daily.    [provider]    Allergies    Codeine, Contrast media [iodinated diagnostic agents], and Vistaril [hydroxyzine]  Review of Systems   Review of Systems  Constitutional:       Per HPI, otherwise negative  HENT:       Per HPI, otherwise negative  Respiratory:       Per HPI, otherwise negative  Cardiovascular:       Per HPI, otherwise negative  Gastrointestinal: Positive for abdominal pain, nausea and vomiting.  Endocrine:       Negative aside from HPI  Genitourinary:       Neg aside from HPI   Musculoskeletal:       Per HPI, otherwise negative  Skin: Negative.   Neurological: Positive for weakness. Negative for syncope.    Physical Exam Updated Vital Signs BP (!) 159/65   Pulse 75   Temp 97.9 F (36.6 C) (Oral)   Resp (!) 32   Ht 5\' 8"  (1.727 m)   Wt 112 kg   SpO2 97%   BMI 37.54 kg/m   Physical Exam Vitals and nursing note reviewed.  Constitutional:      General: She is not in acute distress.    Appearance: She is well-developed.      Comments: Unwell appearing obese elderly female resting eyes closed.  HENT:     Head: Normocephalic and atraumatic.  Eyes:     Conjunctiva/sclera: Conjunctivae normal.  Cardiovascular:     Rate and Rhythm: Normal rate and regular rhythm.  Pulmonary:     Effort: Pulmonary effort is normal. Tachypnea present. No respiratory distress.     Breath sounds: Normal breath sounds. No stridor.  Chest:    Abdominal:     General: There is no distension.     Comments: Some tenderness to palpation left upper quadrant, otherwise abdominal exam unremarkable  Skin:    General: Skin is warm and dry.  Neurological:     Mental Status: She is alert and oriented to person, place, and time.     Cranial Nerves: No cranial nerve deficit.     Comments: Patient indicates that she has poor left ear hearing, but face is symmetric, speech is clear, she moves all extremities spontaneously and follows commands appropriately.  Psychiatric:        Mood and Affect: Mood normal.     ED Results / Procedures / Treatments   Labs (all labs ordered are listed, but only abnormal results are displayed) Labs Reviewed  BASIC METABOLIC PANEL - Abnormal; Notable for the following components:      Result Value   Glucose, Bld 120 (*)    Creatinine, Ser 1.15 (*)    GFR, Estimated 50 (*)    All other components within normal limits  D-DIMER, QUANTITATIVE (NOT AT [facility]) - Abnormal; Notable for the following components:   D-Dimer, Quant 1.74 (*)    All other components within normal limits  URINALYSIS, ROUTINE W REFLEX MICROSCOPIC - Abnormal; Notable for the following components:   Ketones, ur 5 (*)    All other components within normal limits  HEPATIC FUNCTION PANEL - Abnormal; Notable for the following components:   Total Protein 6.3 (*)    All other components within normal limits  RESP PANEL BY RT-PCR (FLU A&B, COVID) [id number]  CBC  SEDIMENTATION RATE  LIPASE, BLOOD  TROPONIN I (HIGH SENSITIVITY)  TROPONIN I (HIGH  SENSITIVITY)    EKG EKG Interpretation  Date/Time:  Wednesday March 05 2020 11:10:20 EST Ventricular Rate:  74 PR Interval:  162 QRS Duration: 74 QT Interval:  406 QTC Calculation: 450 R Axis:   36 Text Interpretation: Normal sinus rhythm Normal ECG Confirmed by Gerhard MunchLockwood, Brenae Lasecki [phone]([id number]) on 03/05/2020 3:41:11 PM   Radiology DG Chest 2 View  Result Date: 03/05/2020 CLINICAL DATA:  Chest pain. Additional history provided: Patient reports center and left chest pain, nausea, shortness of breath. Posterior hypertension and diabetes mellitus. Nonsmoker. EXAM: CHEST -  2 VIEW COMPARISON:  Prior chest radiographs 11/21/2019 and earlier. FINDINGS: Heart size within normal limits. No appreciable airspace consolidation or pulmonary edema. No evidence of pleural effusion or pneumothorax. No acute bony abnormality identified. Thoracic spondylosis. IMPRESSION: No evidence of active cardiopulmonary disease. Electronically Signed   By: Jackey Loge DO   On: 03/05/2020 11:50   CT Renal Stone Study  Result Date: 03/05/2020 CLINICAL DATA:  Flank pain, kidney stone suspected L axilla, L UQ and L flank pain w N/V EXAM: CT ABDOMEN AND PELVIS WITHOUT CONTRAST TECHNIQUE: Multidetector CT imaging of the abdomen and pelvis was performed following the standard protocol without IV contrast. COMPARISON:  CT abdomen pelvis 09/02/2017. FINDINGS: Lower chest: No acute abnormality. Hepatobiliary: No focal liver abnormality is seen. Status post cholecystectomy. No biliary dilatation. Pancreas: Diffusely atrophic. No focal lesion. Vague haziness along the uncinate process of the pancreas. Otherwise normal pancreatic contour. No surrounding inflammatory changes. No main pancreatic ductal dilatation. Spleen: Normal in size without focal abnormality. Adrenals/Urinary Tract: No adrenal nodule bilaterally. No nephrolithiasis, no hydronephrosis, and no contour-deforming renal mass. No ureterolithiasis or hydroureter. The urinary  bladder is unremarkable. Stomach/Bowel: Stomach is within normal limits. No evidence of bowel wall thickening or dilatation. Scattered colonic diverticulosis appendix appears normal. Vascular/Lymphatic: No abdominal aorta or iliac aneurysm. Mild atherosclerotic plaque of the aorta and its branches. No abdominal, pelvic, or inguinal lymphadenopathy. Reproductive: Status post hysterectomy. No adnexal masses. Other: Haziness of the mesentery of the small bowel is similar to prior. No intraperitoneal free fluid. No intraperitoneal free gas. No organized fluid collection. Musculoskeletal: No abdominal wall hernia or abnormality. No suspicious lytic or blastic osseous lesions. No acute displaced fracture. Multilevel degenerative changes of the spine. IMPRESSION: 1. Vague haziness along the uncinate process of the pancreas may be due to stable findings that are suspicious for mild mesenteric panniculitis. Consider correlation with lipase levels to evaluate for mild acute pancreatitis. 2. Diffuse colonic diverticulosis with no definite findings acute diverticulitis. 3. No nephroureterolithiasis. 4. Normal appendix. 5. Aortic Atherosclerosis (ICD10-I70.0). Electronically Signed   By: Tish Frederickson M.D.   On: 03/05/2020 18:47    Procedures Procedures (including critical care time)  Medications Ordered in ED Medications  fentaNYL (SUBLIMAZE) injection 50 mcg (has no administration in time range)  ondansetron (ZOFRAN) injection 4 mg (has no administration in time range)  hydrocortisone sodium succinate (SOLU-CORTEF) injection 200 mg (has no administration in time range)  diphenhydrAMINE (BENADRYL) capsule 50 mg (has no administration in time range)    Or  diphenhydrAMINE (BENADRYL) injection 50 mg (has no administration in time range)  sodium chloride 0.9 % bolus 500 mL (500 mLs Intravenous New Bag/Given 03/05/20 1658)  fentaNYL (SUBLIMAZE) injection 25 mcg (25 mcg Intravenous Given 03/05/20 1656)    ED Course   I have reviewed the triage vital signs and the nursing notes.  Pertinent labs & imaging results that were available during my care of the patient were reviewed by me and considered in my medical decision making (see chart for details). Review notable for admission for altered mental status earlier this year.  Seemingly by time of discharge she had improved substantially.  9:30 PM Patient sitting up, retching.  She notes that she continues to have pain. Initial findings discussed, notable for CT suggesting possible pancreatitis, though her lipase is within normal limits. Given the patient's elevated D-dimer, she is awaiting preparation for contrast angiography.  Other initial findings include mild ketonuria, but normal troponin, nonischemic EKG.  11:48 PM Patient  remains in similar condition, awaiting angiography. Given her persistent nausea, vomiting, she will require repeat evaluation following ongoing fluids, analgesia, antiemetics, and after mentioned CT results. Dr. Dalene Seltzer is aware of the patient. Final Clinical Impression(s) / ED Diagnoses Final diagnoses:  Atypical chest pain  Bilious vomiting with nausea     Gerhard Munch, MD 03/05/20 2349

## 2020-03-05 NOTE — ED Notes (Signed)
Patient transported to CT 

## 2020-03-05 NOTE — ED Notes (Addendum)
Pt assisted to the bathroom. Pt was complaining of abdominal pain. Pt did state that she felt a little better by sitting on the edge of the bed and by urinating when she went to use the bathroom.

## 2020-03-05 NOTE — ED Triage Notes (Signed)
Pt here from home with ems with c/o cp , pt received 324 mg asa and 1 nitro for left side cp radiates to the back , pt is worse with movement

## 2020-03-05 NOTE — ED Notes (Signed)
Main lab to add on hfp and lipase

## 2020-03-06 ENCOUNTER — Emergency Department (HOSPITAL_COMMUNITY): Payer: Medicare PPO

## 2020-03-06 MED ORDER — TECHNETIUM TO 99M ALBUMIN AGGREGATED
4.2700 | Freq: Once | INTRAVENOUS | Status: AC | PRN
  Administered 2020-03-06: 4.27 via INTRAVENOUS

## 2020-03-06 MED ORDER — ONDANSETRON 4 MG PO TBDP: 4.0000 mg | ORAL_TABLET | Freq: Three times a day (TID) | ORAL | 0 refills | Status: DC | PRN

## 2020-03-06 NOTE — ED Notes (Signed)
Pt returned from [place] 

## 2020-03-06 NOTE — ED Provider Notes (Signed)
  Physical Exam  BP (!) 140/57   Pulse 71   Temp 97.7 F (36.5 C) (Oral)   Resp 19   Ht 5\' 8"  (1.727 m)   Wt 112 kg   SpO2 97%   BMI 37.54 kg/m   Physical Exam  ED Course/Procedures     Procedures  MDM  Please see previous notes for history and physical. Briefly, this is a 74yo female who presented with left sided chest pain, nausea and vomiting.  PE study pending with pre-treatment.    UA without signs of infection, no signs of nephrolithiasis. 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  No signs of obstruction. Low suspicion for aortic dissection by history and exam.  Delta troponins negative.  On my evaluation, she reports having dyspnea, nausea and hives with prior contrast administration concerning for anaphylaxis.  Given this, ordered VQ scan for further evaluation for PE. VQ scan with low probability.    She reports being both pain and nausea free since I assumed care. She is tolerating po without problems.  Recommend outpatient follow up with PCP and Cardiology and discussed reasons to return.  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 Given rx for zofran and discharged in stable condition.       74yo, MD 03/06/20 2155

## 2020-03-06 NOTE — ED Notes (Signed)
Pt transported to [facility] 

## 2020-03-06 NOTE — ED Notes (Signed)
Pt reporting she cannot tolerate constant dye due to allergy noted in chart, "hives"  Dalene Seltzer, MD made aware.

## 2020-03-06 NOTE — ED Notes (Signed)
Left message with Nuclear Medicine tech on call to callback Dr. Dalene Seltzer at [phone]

## 2020-03-19 NOTE — Progress Notes (Signed)
Cardiology Office Note:   Date:  03/20/2020  NAME:  Jasmine Cohen    MRN: [id number] DOB:  1945/06/18   PCP:  Kaleen Mask, MD  Cardiologist:  No primary care provider on file.   Referring MD: Alvira Monday, MD   Chief Complaint  Patient presents with  . Chest Pain   History of Present Illness:   Jasmine Cohen is a 74 y.o. female with a hx of DM, HTN, obesity who is being seen today for the evaluation of chest pain at the request of Kaleen Mask, MD. Seen in the ER 03/05/2020 for chest pain. Troponin negative x 2. V/Q scan negative. EKG normal.   She reports she had chest pain that woke her up from her sleep.  Described as sharp central chest pain.  No associated shortness of breath.  She reports no identifiable trigger.  No alleviating factors.  Symptoms lasted several hours and resolved.  She does suffer from heartburn.  She reports this felt a bit different.  She reports exertion that make her pain worse.  Nothing really made it better.  Symptoms have gone away and have not come back.  She reports she is not that active and use a walker.  She describes no exertional chest pain or shortness of breath.  Her EKG today in office is normal.  CVD risk factors include diabetes with an A1c of 6.9.  I do not have a recent lipid profile.  She has high blood pressure but this is well controlled.  Blood pressure is 131/72.  BMI 38.  She is never had these symptoms before.  Have not occurred again.  Occurred on 03/05/2020.  We did discuss sleep apnea.  She reports she does not know if she snores.  She does not describe excessive fatigue or falling asleep easily.  She is a never smoker.  She does not drink alcohol or use drugs.  She reports there are some stressful things going on in her family.  Apparently her son is moving to [place] and she is concerned about this.  Apparently she just found this out a few days ago.  Her symptoms of chest pain were not associated with the sad news of  her son moving to [place].   Problem List 1. Diabetes -A1c 6.9 2. HTN 3. Obesity  Past Medical History: Past Medical History:  Diagnosis Date  . Diabetes mellitus without complication (HCC)   . GERD (gastroesophageal reflux disease)   . Hyperlipidemia   . Hypertension     Past Surgical History: Past Surgical History:  Procedure Laterality Date  . ABDOMINAL HYSTERECTOMY    . CHOLECYSTECTOMY      Current Medications: Current Meds  Medication Sig  . ACCU-CHEK AVIVA PLUS test strip 1 each daily.  . Accu-Chek FastClix Lancets MISC Apply topically.  Marland Kitchen acetaminophen (TYLENOL) 650 MG CR tablet Take 650 mg by mouth as needed for pain.   Marland Kitchen atorvastatin (LIPITOR) 40 MG tablet Take 40 mg by mouth at bedtime.   Marland Kitchen JANUVIA 50 MG tablet Take 50 mg by mouth daily.  Marland Kitchen lisinopril (PRINIVIL,ZESTRIL) 20 MG tablet Take 20 mg by mouth daily.   Marland Kitchen OVER THE COUNTER MEDICATION Take 1 tablet by mouth daily. Eye vitamin  . OVER THE COUNTER MEDICATION Place 1 drop into both eyes daily. Eye drop for  to help with degeneration  . pantoprazole (PROTONIX) 20 MG tablet Take 20 mg by mouth 2 (two) times daily.  Marland Kitchen saccharomyces boulardii (FLORASTOR)  250 MG capsule Take 250 mg by mouth daily.  . Vitamin D-Vitamin K (VITAMIN K2-VITAMIN D3 PO)   . [DISCONTINUED] cimetidine (TAGAMET) 400 MG tablet Take 400 mg by mouth 2 (two) times daily.  . [DISCONTINUED] ondansetron (ZOFRAN ODT) 4 MG disintegrating tablet Take 1 tablet (4 mg total) by mouth every 8 (eight) hours as needed for nausea or vomiting.  . [DISCONTINUED] VITAMIN D PO Take 5,000 mcg by mouth daily.     Allergies:    Codeine, Contrast media [iodinated diagnostic agents], and Vistaril [hydroxyzine]   Social History: Social History   Socioeconomic History  . Marital status: Divorced    Spouse name: Not on file  . Number of children: 1  . Years of education: Not on file  . Highest education level: Not on file  Occupational History  . Occupation:  retired  Tobacco Use  . Smoking status: Never Smoker  . Smokeless tobacco: Never Used  Vaping Use  . Vaping Use: Never used  Substance and Sexual Activity  . Alcohol use: No  . Drug use: No  . Sexual activity: Not Currently  Other Topics Concern  . Not on file  Social History Narrative  . Not on file   Social Determinants of Health   Financial Resource Strain: Not on file  Food Insecurity: Not on file  Transportation Needs: Not on file  Physical Activity: Not on file  Stress: Not on file  Social Connections: Not on file     Family History: The patient's family history includes Breast cancer in her mother. There is no history of Diabetes Mellitus II.  ROS:   All other ROS reviewed and negative. Pertinent positives noted in the HPI.     EKGs/Labs/Other Studies Reviewed:   The following studies were personally reviewed by me today:  EKG:  EKG is ordered today.  The ekg ordered today demonstrates normal sinus rhythm, heart rate 63, no acute ischemic changes, no evidence of prior infarction, and was personally reviewed by me.   Recent Labs: 11/21/2019: TSH 0.561 11/22/2019: Magnesium 1.9 03/05/2020: ALT 18; BUN 20; Creatinine, Ser 1.15; Hemoglobin 14.3; Platelets 374; Potassium 4.1; Sodium 141   Recent Lipid Panel    Component Value Date/Time   TRIG 57 03/13/2019 1413    Physical Exam:   VS:  BP 131/72   Pulse 63   Ht 5' 7.5" (1.715 m)   Wt 248 lb (112.5 kg)   SpO2 100%   BMI 38.27 kg/m    Wt Readings from Last 3 Encounters:  03/20/20 248 lb (112.5 kg)  03/05/20 246 lb 14.6 oz (112 kg)  11/22/19 246 lb 14.3 oz (112 kg)    General: Well nourished, well developed, in no acute distress Head: Atraumatic, normal size  Eyes: PEERLA, EOMI  Neck: Supple, no JVD Endocrine: No thryomegaly Cardiac: Normal S1, S2; RRR; no murmurs, rubs, or gallops Lungs: Clear to auscultation bilaterally, no wheezing, rhonchi or rales  Abd: Soft, nontender, no hepatomegaly  Ext: No  edema, pulses 2+ Musculoskeletal: No deformities, BUE and BLE strength normal and equal Skin: Warm and dry, no rashes   Neuro: Alert and oriented to person, place, time, and situation, CNII-XII grossly intact, no focal deficits  Psych: Normal mood and affect   ASSESSMENT:   Jasmine Cohen is a 74 y.o. female who presents for the following: 1. Chest pain, unspecified type     PLAN:   1. Chest pain, unspecified type -Atypical chest pain.  Woke her up from  her sleep.  Suspect symptoms are either sleep apnea related or acid reflux.  Troponins were negative in the hospital.  Her EKG is normal.  She is had no further symptoms.  We discussed coronary CTA versus Lexi nuclear medicine stress test.  She reports she cannot receive iodinated contrast agents.  She is allergic to these.  We will therefore pursue a Lexi nuclear medicine stress test.  She seems to be doing well otherwise.  I will see her back as needed.  Follow-up will be determined based on the results of the stress test.  Disposition: Return if symptoms worsen or fail to improve.  Medication Adjustments/Labs and Tests Ordered: Current medicines are reviewed at length with the patient today.  Concerns regarding medicines are outlined above.  Orders Placed This Encounter  Procedures  . MYOCARDIAL PERFUSION IMAGING  . EKG 12-Lead   No orders of the defined types were placed in this encounter.   Patient Instructions  Medication Instructions:  The current medical regimen is effective;  continue present plan and medications.  *If you need a refill on your cardiac medications before your next appointment, please call your pharmacy*   Testing/Procedures: Your physician has requested that you have a lexiscan myoview. A cardiac stress test is a cardiological test that measures the heart's ability to respond to external stress in a controlled clinical environment. The stress response is induced by intravenous pharmacological stimulation.     Follow-Up: At [facility], you and your health needs are our priority.  As part of our continuing mission to provide you with exceptional heart care, we have created designated Provider Care Teams.  These Care Teams include your primary Cardiologist (physician) and Advanced Practice Providers (APPs -  Physician Assistants and Nurse Practitioners) who all work together to provide you with the care you need, when you need it.  We recommend signing up for the patient portal called "MyChart".  Sign up information is provided on this After Visit Summary.  MyChart is used to connect with patients for Virtual Visits (Telemedicine).  Patients are able to view lab/test results, encounter notes, upcoming appointments, etc.  Non-urgent messages can be sent to your provider as well.   To learn more about what you can do with MyChart, go to [email or web address].    Your next appointment:   As needed  The format for your next appointment:   In Person  Provider:   Lennie Odor, MD          Signed, Lenna Gilford. Flora Lipps, MD [facility]  [address] [place], [place] [address] [phone]  03/20/2020 10:18 AM

## 2020-03-20 ENCOUNTER — Ambulatory Visit (INDEPENDENT_AMBULATORY_CARE_PROVIDER_SITE_OTHER): Payer: Medicare PPO | Admitting: Cardiovascular Disease

## 2020-03-20 ENCOUNTER — Encounter: Payer: Self-pay | Admitting: Cardiovascular Disease

## 2020-03-20 ENCOUNTER — Other Ambulatory Visit: Payer: Self-pay

## 2020-03-20 VITALS — BP 131/72 | HR 63 | Ht 67.5 in | Wt 248.0 lb

## 2020-03-20 DIAGNOSIS — R079 Chest pain, unspecified: Secondary | ICD-10-CM | POA: Diagnosis not present

## 2020-03-20 NOTE — Patient Instructions (Signed)
Medication Instructions:  The current medical regimen is effective;  continue present plan and medications.  *If you need a refill on your cardiac medications before your next appointment, please call your pharmacy*   Testing/Procedures: Your physician has requested that you have a lexiscan myoview. A cardiac stress test is a cardiological test that measures the heart's ability to respond to external stress in a controlled clinical environment. The stress response is induced by intravenous pharmacological stimulation.    Follow-Up: At [facility], you and your health needs are our priority.  As part of our continuing mission to provide you with exceptional heart care, we have created designated Provider Care Teams.  These Care Teams include your primary Cardiologist (physician) and Advanced Practice Providers (APPs -  Physician Assistants and Nurse Practitioners) who all work together to provide you with the care you need, when you need it.  We recommend signing up for the patient portal called "MyChart".  Sign up information is provided on this After Visit Summary.  MyChart is used to connect with patients for Virtual Visits (Telemedicine).  Patients are able to view lab/test results, encounter notes, upcoming appointments, etc.  Non-urgent messages can be sent to your provider as well.   To learn more about what you can do with MyChart, go to [email or web address].    Your next appointment:   As needed  The format for your next appointment:   In Person  Provider:   Lennie Odor, MD

## 2020-04-02 ENCOUNTER — Inpatient Hospital Stay (HOSPITAL_COMMUNITY): Admission: RE | Admit: 2020-04-02 | Payer: Medicare PPO | Source: Ambulatory Visit

## 2020-04-03 ENCOUNTER — Ambulatory Visit (HOSPITAL_COMMUNITY): Payer: Medicare PPO

## 2020-05-27 ENCOUNTER — Other Ambulatory Visit: Payer: Self-pay | Admitting: Family Medicine

## 2020-05-27 DIAGNOSIS — Z1231 Encounter for screening mammogram for malignant neoplasm of breast: Secondary | ICD-10-CM

## 2020-06-10 ENCOUNTER — Other Ambulatory Visit: Payer: Self-pay | Admitting: Family Medicine

## 2020-06-10 DIAGNOSIS — M79661 Pain in right lower leg: Secondary | ICD-10-CM

## 2020-06-12 ENCOUNTER — Ambulatory Visit
Admission: RE | Admit: 2020-06-12 | Discharge: 2020-06-12 | Disposition: A | Discharge status: Home / Self Care | Payer: Medicare HMO | Source: Ambulatory Visit | Attending: Family Medicine | Admitting: Family Medicine

## 2020-06-12 DIAGNOSIS — M79661 Pain in right lower leg: Secondary | ICD-10-CM

## 2020-07-18 ENCOUNTER — Ambulatory Visit: Payer: Medicare PPO

## 2020-07-27 ENCOUNTER — Encounter: Payer: Self-pay | Admitting: Emergency Medicine

## 2020-07-27 ENCOUNTER — Other Ambulatory Visit: Payer: Self-pay

## 2020-07-27 ENCOUNTER — Ambulatory Visit
Admission: EM | Admit: 2020-07-27 | Discharge: 2020-07-27 | Disposition: A | Discharge status: Home / Self Care | Payer: Medicare HMO | Attending: Emergency Medicine | Admitting: Emergency Medicine

## 2020-07-27 DIAGNOSIS — L03116 Cellulitis of left lower limb: Secondary | ICD-10-CM

## 2020-07-27 DIAGNOSIS — R21 Rash and other nonspecific skin eruption: Secondary | ICD-10-CM | POA: Diagnosis not present

## 2020-07-27 MED ORDER — TRIAMCINOLONE ACETONIDE 0.1 % EX CREA: 1.0000 "application " | TOPICAL_CREAM | Freq: Two times a day (BID) | CUTANEOUS | 0 refills | Status: DC

## 2020-07-27 NOTE — Discharge Instructions (Signed)
Please take full course of doxycycline previously prescribed Triamcinolone twice daily to area to help with itching/inflammation Elevate leg Follow-up if not improving or worsening

## 2020-07-27 NOTE — ED Triage Notes (Signed)
Pt here for left lower leg redness and swelling that started out as small area last week and was given antibiotics; per family pt did not take antibiotics until today

## 2020-07-27 NOTE — ED Provider Notes (Signed)
[facility]    CSN: [id number] Arrival date & time: 07/27/20  1300      History   Chief Complaint Chief Complaint  Patient presents with  . Wound Check    HPI Jasmine Cohen is a 75 y.o. female history of hypertension, hyperlipidemia, GERD, DM type II, presenting today for evaluation of a rash.  Noticed rash on left leg.  Reports area to left lower leg developing area of erythema earlier in the week.  Was seen by primary care on Wednesday and was recommended to start on doxycycline, have labs drawn to screen for tickborne illness.  She did not begin taking doxycycline until today as she was concerned about potential side effects associated with this.  She has been applying antibiotic ointment to the area.  Redness has spread.  Has associated itching.  Denies significant pain.  Denies fevers.  HPI  Past Medical History:  Diagnosis Date  . Diabetes mellitus without complication (HCC)   . GERD (gastroesophageal reflux disease)   . Hyperlipidemia   . Hypertension     Patient Active Problem List   Diagnosis Date Noted  . Acute encephalopathy 11/23/2019  . Metabolic acidosis 11/21/2019  . Sepsis (HCC) 03/14/2019  . Diverticulitis 03/14/2019  . Acute metabolic encephalopathy 03/13/2019  . Essential hypertension 03/13/2019  . AKI (acute kidney injury) (HCC) 03/13/2019  . Type 2 diabetes mellitus without complication (HCC) 03/13/2019  . Abdominal pain 03/27/2014  . Leucocytosis 03/27/2014  . Diabetes mellitus type 2, controlled (HCC) 03/27/2014  . Hyperlipidemia 03/27/2014  . Fall 11/27/2012  . Avulsed toenail 11/27/2012  . Contusion of left knee 11/27/2012    Past Surgical History:  Procedure Laterality Date  . ABDOMINAL HYSTERECTOMY    . CHOLECYSTECTOMY      OB History   No obstetric history on file.      Home Medications    Prior to Admission medications   Medication Sig Start Date End Date Taking? Authorizing Provider  triamcinolone cream  (KENALOG) 0.1 % Apply 1 application topically 2 (two) times daily. 07/27/20  Yes Dominiq Fontaine C, PA-C  ACCU-CHEK AVIVA PLUS test strip 1 each daily. 01/21/20   [provider]  Accu-Chek FastClix Lancets MISC Apply topically. 01/21/20   [provider]  acetaminophen (TYLENOL) 650 MG CR tablet Take 650 mg by mouth as needed for pain.     [provider]  atorvastatin (LIPITOR) 40 MG tablet Take 40 mg by mouth at bedtime.  12/24/13   [provider]  JANUVIA 50 MG tablet Take 50 mg by mouth daily. 02/28/20   [provider]  lisinopril (PRINIVIL,ZESTRIL) 20 MG tablet Take 20 mg by mouth daily.     [provider]  OVER THE COUNTER MEDICATION Take 1 tablet by mouth daily. Eye vitamin    [provider]  OVER THE COUNTER MEDICATION Place 1 drop into both eyes daily. Eye drop for  to help with degeneration    [provider]  pantoprazole (PROTONIX) 20 MG tablet Take 20 mg by mouth 2 (two) times daily.    [provider]  saccharomyces boulardii (FLORASTOR) 250 MG capsule Take 250 mg by mouth daily.    [provider]  Vitamin D-Vitamin K (VITAMIN K2-VITAMIN D3 PO)     [provider]    Family History Family History  Problem Relation Age of Onset  . Breast cancer Mother   . Diabetes Mellitus II Neg Hx     Social History  Social History   Tobacco Use  . Smoking status: Never Smoker  . Smokeless tobacco: Never Used  Vaping Use  . Vaping Use: Never used  Substance Use Topics  . Alcohol use: No  . Drug use: No     Allergies   Codeine, Contrast media [iodinated diagnostic agents], and Vistaril [hydroxyzine]   Review of Systems Review of Systems  Constitutional: Negative for fatigue and fever.  Eyes: Negative for visual disturbance.  Respiratory: Negative for shortness of breath.   Cardiovascular: Negative for chest pain.  Gastrointestinal: Negative for abdominal pain, nausea and  vomiting.  Musculoskeletal: Negative for arthralgias and joint swelling.  Skin: Positive for color change and rash. Negative for wound.  Neurological: Negative for dizziness, weakness, light-headedness and headaches.     Physical Exam Triage Vital Signs ED Triage Vitals  Enc Vitals Group     BP      Pulse      Resp      Temp      Temp src      SpO2      Weight      Height      Head Circumference      Peak Flow      Pain Score      Pain Loc      Pain Edu?      Excl. in GC?    No data found.  Updated Vital Signs BP (!) 168/87 (BP Location: Left Arm)   Pulse 79   Temp 97.8 F (36.6 C) (Oral)   Resp 18   SpO2 98%   Visual Acuity Right Eye Distance:   Left Eye Distance:   Bilateral Distance:    Right Eye Near:   Left Eye Near:    Bilateral Near:     Physical Exam Vitals and nursing note reviewed.  Constitutional:      Appearance: She is well-developed.     Comments: No acute distress  HENT:     Head: Normocephalic and atraumatic.     Nose: Nose normal.  Eyes:     Conjunctiva/sclera: Conjunctivae normal.  Cardiovascular:     Rate and Rhythm: Normal rate.  Pulmonary:     Effort: Pulmonary effort is normal. No respiratory distress.  Abdominal:     General: There is no distension.  Musculoskeletal:        General: Normal range of motion.     Cervical back: Neck supple.     Comments: Left lower leg with area of erythema noted posterior Lee extending slightly anteriorly, no induration, no signs of open wounds, no obvious foreign body  Skin:    General: Skin is warm and dry.  Neurological:     Mental Status: She is alert and oriented to person, place, and time.      UC Treatments / Results  Labs (all labs ordered are listed, but only abnormal results are displayed) Labs Reviewed - No data to display  EKG   Radiology No results found.  Procedures Procedures (including critical care time)  Medications Ordered in UC Medications - No data to  display  Initial Impression / Assessment and Plan / UC Course  I have reviewed the triage vital signs and the nursing notes.  Pertinent labs & imaging results that were available during my care of the patient were reviewed by me and considered in my medical decision making (see chart for details).     Encourage patient to complete course of doxycycline to cover cellulitis,  also providing triamcinolone to help with localized itching and inflammation, elevation,  Discussed strict return precautions. Patient verbalized understanding and is agreeable with plan.  Final Clinical Impressions(s) / UC Diagnoses   Final diagnoses:  Cellulitis of leg, left  Rash and nonspecific skin eruption     Discharge Instructions     Please take full course of doxycycline previously prescribed Triamcinolone twice daily to area to help with itching/inflammation Elevate leg Follow-up if not improving or worsening    ED Prescriptions    Medication Sig Dispense Auth. Provider   triamcinolone cream (KENALOG) 0.1 % Apply 1 application topically 2 (two) times daily. 80 g Ines Warf, [place] C, PA-C     PDMP not reviewed this encounter.   Lew Dawes, [place] 07/27/20 1439

## 2020-09-03 ENCOUNTER — Other Ambulatory Visit: Payer: Self-pay

## 2020-09-03 ENCOUNTER — Ambulatory Visit
Admission: RE | Admit: 2020-09-03 | Discharge: 2020-09-03 | Disposition: A | Discharge status: Home / Self Care | Payer: Medicare HMO | Source: Ambulatory Visit | Attending: Family Medicine | Admitting: Family Medicine

## 2020-09-03 DIAGNOSIS — Z1231 Encounter for screening mammogram for malignant neoplasm of breast: Secondary | ICD-10-CM

## 2021-11-25 ENCOUNTER — Other Ambulatory Visit: Payer: Self-pay

## 2021-11-25 ENCOUNTER — Encounter (HOSPITAL_COMMUNITY): Payer: Self-pay | Admitting: *Deleted

## 2021-11-25 ENCOUNTER — Observation Stay (HOSPITAL_COMMUNITY)
Admission: EM | Admit: 2021-11-25 | Discharge: 2021-11-28 | Disposition: A | Discharge status: Home Health | Payer: Medicare PPO | Attending: Internal Medicine | Admitting: Internal Medicine

## 2021-11-25 DIAGNOSIS — Z7409 Other reduced mobility: Secondary | ICD-10-CM | POA: Insufficient documentation

## 2021-11-25 DIAGNOSIS — R4182 Altered mental status, unspecified: Secondary | ICD-10-CM

## 2021-11-25 DIAGNOSIS — R2681 Unsteadiness on feet: Secondary | ICD-10-CM | POA: Insufficient documentation

## 2021-11-25 DIAGNOSIS — R109 Unspecified abdominal pain: Secondary | ICD-10-CM | POA: Diagnosis not present

## 2021-11-25 DIAGNOSIS — Z79899 Other long term (current) drug therapy: Secondary | ICD-10-CM | POA: Diagnosis not present

## 2021-11-25 DIAGNOSIS — R2689 Other abnormalities of gait and mobility: Secondary | ICD-10-CM | POA: Insufficient documentation

## 2021-11-25 DIAGNOSIS — G9341 Metabolic encephalopathy: Principal | ICD-10-CM | POA: Insufficient documentation

## 2021-11-25 DIAGNOSIS — E119 Type 2 diabetes mellitus without complications: Secondary | ICD-10-CM | POA: Insufficient documentation

## 2021-11-25 DIAGNOSIS — M6281 Muscle weakness (generalized): Secondary | ICD-10-CM | POA: Diagnosis not present

## 2021-11-25 DIAGNOSIS — I10 Essential (primary) hypertension: Secondary | ICD-10-CM | POA: Insufficient documentation

## 2021-11-25 DIAGNOSIS — N289 Disorder of kidney and ureter, unspecified: Secondary | ICD-10-CM | POA: Diagnosis not present

## 2021-11-25 DIAGNOSIS — Z7984 Long term (current) use of oral hypoglycemic drugs: Secondary | ICD-10-CM | POA: Insufficient documentation

## 2021-11-25 NOTE — ED Triage Notes (Signed)
Pt from home by [facility] for altered mental status. Per son, he left for the grocery store at 1700, returned at 1930; pt reported she did not feel well. EMS VS BP R arm 131/64, L arm 183/115, pulse 90, spO2 98%, cbg 106. Dr. Preston Fleeting in room on arrival.

## 2021-11-26 ENCOUNTER — Emergency Department (HOSPITAL_COMMUNITY): Payer: Medicare PPO

## 2021-11-26 ENCOUNTER — Observation Stay (HOSPITAL_COMMUNITY): Payer: Medicare PPO

## 2021-11-26 DIAGNOSIS — R1032 Left lower quadrant pain: Secondary | ICD-10-CM

## 2021-11-26 DIAGNOSIS — G9341 Metabolic encephalopathy: Secondary | ICD-10-CM | POA: Diagnosis not present

## 2021-11-26 DIAGNOSIS — E119 Type 2 diabetes mellitus without complications: Secondary | ICD-10-CM | POA: Diagnosis not present

## 2021-11-26 DIAGNOSIS — I10 Essential (primary) hypertension: Secondary | ICD-10-CM

## 2021-11-26 LAB — CBC WITH DIFFERENTIAL/PLATELET
Abs Immature Granulocytes: 0.04 10*3/uL (ref 0.00–0.07)
Basophils Absolute: 0.1 10*3/uL (ref 0.0–0.1)
Basophils Relative: 1 %
Eosinophils Absolute: 0.1 10*3/uL (ref 0.0–0.5)
Eosinophils Relative: 0 %
HCT: 43.4 % (ref 36.0–46.0)
Hemoglobin: 14.4 g/dL (ref 12.0–15.0)
Immature Granulocytes: 0 %
Lymphocytes Relative: 10 %
Lymphs Abs: 1.3 10*3/uL (ref 0.7–4.0)
MCH: 29.6 pg (ref 26.0–34.0)
MCHC: 33.2 g/dL (ref 30.0–36.0)
MCV: 89.1 fL (ref 80.0–100.0)
Monocytes Absolute: 0.8 10*3/uL (ref 0.1–1.0)
Monocytes Relative: 6 %
Neutro Abs: 10.2 10*3/uL — ABNORMAL HIGH (ref 1.7–7.7)
Neutrophils Relative %: 83 %
Platelets: 321 10*3/uL (ref 150–400)
RBC: 4.87 MIL/uL (ref 3.87–5.11)
RDW: 13.3 % (ref 11.5–15.5)
WBC: 12.5 10*3/uL — ABNORMAL HIGH (ref 4.0–10.5)
nRBC: 0 % (ref 0.0–0.2)

## 2021-11-26 LAB — URINALYSIS, ROUTINE W REFLEX MICROSCOPIC
Bilirubin Urine: NEGATIVE
Glucose, UA: NEGATIVE mg/dL
Hgb urine dipstick: NEGATIVE
Ketones, ur: 20 mg/dL — AB
Leukocytes,Ua: NEGATIVE
Nitrite: NEGATIVE
Protein, ur: NEGATIVE mg/dL
Specific Gravity, Urine: 1.018 (ref 1.005–1.030)
pH: 5 (ref 5.0–8.0)

## 2021-11-26 LAB — COMPREHENSIVE METABOLIC PANEL
ALT: 14 U/L (ref 0–44)
AST: 25 U/L (ref 15–41)
Albumin: 3.8 g/dL (ref 3.5–5.0)
Alkaline Phosphatase: 80 U/L (ref 38–126)
Anion gap: 14 (ref 5–15)
BUN: 19 mg/dL (ref 8–23)
CO2: 20 mmol/L — ABNORMAL LOW (ref 22–32)
Calcium: 9.4 mg/dL (ref 8.9–10.3)
Chloride: 104 mmol/L (ref 98–111)
Creatinine, Ser: 1.25 mg/dL — ABNORMAL HIGH (ref 0.44–1.00)
GFR, Estimated: 45 mL/min — ABNORMAL LOW (ref >60)
Glucose, Bld: 139 mg/dL — ABNORMAL HIGH (ref 70–99)
Potassium: 3.9 mmol/L (ref 3.5–5.1)
Sodium: 138 mmol/L (ref 135–145)
Total Bilirubin: 0.7 mg/dL (ref 0.3–1.2)
Total Protein: 6.6 g/dL (ref 6.5–8.1)

## 2021-11-26 LAB — CBG MONITORING, ED: Glucose-Capillary: 89 mg/dL (ref 70–99)

## 2021-11-26 LAB — PROTIME-INR
INR: 1 (ref 0.8–1.2)
Prothrombin Time: 12.8 seconds (ref 11.4–15.2)

## 2021-11-26 LAB — GLUCOSE, CAPILLARY
Glucose-Capillary: 88 mg/dL (ref 70–99)
Glucose-Capillary: 85 mg/dL (ref 70–99)

## 2021-11-26 LAB — LACTIC ACID, PLASMA: Lactic Acid, Venous: 1.8 mmol/L (ref 0.5–1.9)

## 2021-11-26 LAB — AMMONIA: Ammonia: 23 umol/L (ref 9–35)

## 2021-11-26 LAB — APTT: aPTT: 25 seconds (ref 24–36)

## 2021-11-26 MED ORDER — ACETAMINOPHEN 325 MG PO TABS: 650.0000 mg | ORAL_TABLET | Freq: Four times a day (QID) | ORAL | Status: DC | PRN

## 2021-11-26 MED ORDER — HEPARIN SODIUM (PORCINE) 5000 UNIT/ML IJ SOLN: 5000.0000 [IU] | Freq: Three times a day (TID) | INTRAMUSCULAR | Status: DC

## 2021-11-26 MED ORDER — ONDANSETRON HCL 4 MG/2ML IJ SOLN: 4.0000 mg | Freq: Four times a day (QID) | INTRAMUSCULAR | Status: DC | PRN

## 2021-11-26 MED ORDER — ONDANSETRON HCL 4 MG PO TABS: 4.0000 mg | ORAL_TABLET | Freq: Four times a day (QID) | ORAL | Status: DC | PRN

## 2021-11-26 MED ORDER — ENOXAPARIN SODIUM 40 MG/0.4ML IJ SOSY
40.0000 mg | PREFILLED_SYRINGE | INTRAMUSCULAR | Status: DC
  Administered 2021-11-26 – 2021-11-27 (×2): 40 mg via SUBCUTANEOUS
  Filled 2021-11-26 (×2): qty 0.4

## 2021-11-26 MED ORDER — ACETAMINOPHEN 650 MG RE SUPP: 650.0000 mg | Freq: Four times a day (QID) | RECTAL | Status: DC | PRN

## 2021-11-26 MED ORDER — INSULIN ASPART 100 UNIT/ML IJ SOLN: 0.0000 [IU] | INTRAMUSCULAR | Status: DC

## 2021-11-26 MED ORDER — LACTATED RINGERS IV SOLN: INTRAVENOUS | Status: DC

## 2021-11-26 NOTE — Subjective & Objective (Signed)
Chief complaint: Altered mental status. History of present illness. 76 year old female brought in by ambulance for altered mental status.  No family is at the bedside.  Reportedly his son went to the patient's house around 7:30 PM.  She was altered.  Patient brought to the ER.  Work-up in the ER was essentially negative.  UA negative for infection.  Chest x-ray negative for pneumonia.  Lactic acid is normal.  She did have a mild fever of 100.4.  Sodium 138, bicarb of 20, BUN of 19, creatinine 1.25  White count of 12.5, hemoglobin 14.4, platelets of 321  Lactic acid of 1.8  CT head showed no acute intracranial abnormalities.  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  Chest x-ray negative for acute cardiopulmonary disease.  My interpretation the patient's EKG shows normal sinus rhythm.  Due to patient's continued altered mental status, [facility] contacted for admission.

## 2021-11-26 NOTE — ED Provider Notes (Incomplete)
[facility] EMERGENCY DEPARTMENT Provider Note   CSN: [id number] Arrival date & time: 11/25/21  2348     History {Add pertinent medical, surgical, social history, OB history to HPI:1} Chief Complaint  Patient presents with  . Altered Mental Status    Jasmine Cohen is a 76 y.o. female.  HPI     Home Medications Prior to Admission medications   Medication Sig Start Date End Date Taking? Authorizing Provider  ACCU-CHEK AVIVA PLUS test strip 1 each daily. 01/21/20   [provider]  Accu-Chek FastClix Lancets MISC Apply topically. 01/21/20   [provider]  acetaminophen (TYLENOL) 650 MG CR tablet Take 650 mg by mouth as needed for pain.     [provider]  atorvastatin (LIPITOR) 40 MG tablet Take 40 mg by mouth at bedtime.  12/24/13   [provider]  JANUVIA 50 MG tablet Take 50 mg by mouth daily. 02/28/20   [provider]  lisinopril (PRINIVIL,ZESTRIL) 20 MG tablet Take 20 mg by mouth daily.     [provider]  OVER THE COUNTER MEDICATION Take 1 tablet by mouth daily. Eye vitamin    [provider]  OVER THE COUNTER MEDICATION Place 1 drop into both eyes daily. Eye drop for  to help with degeneration    [provider]  pantoprazole (PROTONIX) 20 MG tablet Take 20 mg by mouth 2 (two) times daily.    [provider]  saccharomyces boulardii (FLORASTOR) 250 MG capsule Take 250 mg by mouth daily.    [provider]  triamcinolone cream (KENALOG) 0.1 % Apply 1 application topically 2 (two) times daily. 07/27/20   Wieters, Junius Creamer, PA-C  Vitamin D-Vitamin K (VITAMIN K2-VITAMIN D3 PO)     [provider]      Allergies    Codeine, Contrast media [iodinated contrast media], and Vistaril [hydroxyzine]    Review of Systems   Review of Systems  Physical Exam Updated Vital Signs BP (!) 162/73 (BP Location: Right Arm)   Pulse 78   Temp 98.3 F (36.8 C) (Oral)    Resp (!) 30   SpO2 100%  Physical Exam  ED Results / Procedures / Treatments   Labs (all labs ordered are listed, but only abnormal results are displayed) Labs Reviewed  CULTURE, BLOOD (ROUTINE X 2)  CULTURE, BLOOD (ROUTINE X 2)  URINE CULTURE  COMPREHENSIVE METABOLIC PANEL  LACTIC ACID, PLASMA  LACTIC ACID, PLASMA  CBC WITH DIFFERENTIAL/PLATELET  PROTIME-INR  URINALYSIS, ROUTINE W REFLEX MICROSCOPIC  APTT    EKG EKG Interpretation  Date/Time:  Wednesday November 25 2021 23:55:01 EDT Ventricular Rate:  78 PR Interval:  154 QRS Duration: 89 QT Interval:  393 QTC Calculation: 448 R Axis:   58 Text Interpretation: Sinus rhythm Atrial premature complex When compared with ECG of 03/05/2020, No significant change was found Confirmed by Dione Booze ([id number]) on 11/26/2021 12:00:02 AM  Radiology No results found.  Procedures Procedures  {Document cardiac monitor, telemetry assessment procedure when appropriate:1}  Medications Ordered in ED Medications - No data to display  ED Course/ Medical Decision Making/ A&P                           Medical Decision Making Amount and/or Complexity of Data Reviewed Labs: ordered. Radiology: ordered.   ***  {Document critical care time when appropriate:1} {Document review of labs and clinical decision tools ie heart score, Chads2Vasc2 etc:1}  {  Document your independent review of radiology images, and any outside records:1} {Document your discussion with family members, caretakers, and with consultants:1} {Document social determinants of health affecting pt's care:1} {Document your decision making why or why not admission, treatments were needed:1} Final Clinical Impression(s) / ED Diagnoses Final diagnoses:  None    Rx / DC Orders ED Discharge Orders     None

## 2021-11-26 NOTE — Assessment & Plan Note (Signed)
Stable.  Hold lisinopril for now given the mild increase in her serum creatinine.

## 2021-11-26 NOTE — Assessment & Plan Note (Signed)
Chronic. 

## 2021-11-26 NOTE — Progress Notes (Signed)
   Jasmine Cohen  HMC:[id number] DOB: [birth year] DOA: 11/25/2021 PCP: Kaleen Mask, MD    Brief Narrative:  76 year old with a history of DM2, HLD, and HTN who was brought to the ER via EMS with altered mental status.  The patient's son arrived at her home at 7:30 PM and found her to be confused.  In the ER a UA was unrevealing and a CXR was normal.  CT head noted no acute intracranial abnormalities but did note small vessel ischemic disease and generalized cerebral atrophy.  Consultants:  None  Goals of Care:  Code Status: Prior   DVT prophylaxis: Lovenox   Interim Hx: The patient was examined and interviewed by one of my partners earlier today.  Assessment & Plan:  Acute metabolic encephalopathy of unknown etiology she is not on any medicines that would alter her mentation - CT head was negative - ammonia normal - support w/ hydration and monitor - PT/OT evals   Abdominal pain CT abdomen/pelvis without acute findings  Morbid obesity - There is no height or weight on file to calculate BMI.  Essential hypertension  Diabetes mellitus type 2 Monitor for hypoglycemia   Family Communication: no family present at time of visit Disposition: continue observation - PT/OT evals - lives independently    Objective: Blood pressure (!) 148/58, pulse 77, temperature 99.1 F (37.3 C), temperature source Oral, resp. rate (!) 24, SpO2 98 %. No intake or output data in the 24 hours ending 11/26/21 0734 There were no vitals filed for this visit.  Examination: Examination completed by one of my partners earlier today  CBC: Recent Labs  Lab 11/26/21 0013  WBC 12.5*  NEUTROABS 10.2*  HGB 14.4  HCT 43.4  MCV 89.1  PLT 321   Basic Metabolic Panel: Recent Labs  Lab 11/26/21 0013  NA 138  K 3.9  CL 104  CO2 20*  GLUCOSE 139*  BUN 19  CREATININE 1.25*  CALCIUM 9.4   GFR: CrCl cannot be calculated (Unknown ideal weight.).  Liver Function Tests: Recent Labs  Lab  11/26/21 0013  AST 25  ALT 14  ALKPHOS 80  BILITOT 0.7  PROT 6.6  ALBUMIN 3.8    HbA1C: Hgb A1c MFr Bld  Date/Time Value Ref Range Status  11/21/2019 01:23 PM 6.9 (H) 4.8 - 5.6 % Final    Comment:    (NOTE) Pre diabetes:          5.7%-6.4%  Diabetes:              >6.4%  Glycemic control for   <7.0% adults with diabetes   03/14/2019 02:04 AM 6.8 (H) 4.8 - 5.6 % Final    Comment:    (NOTE) Pre diabetes:          5.7%-6.4% Diabetes:              >6.4% Glycemic control for   <7.0% adults with diabetes     Scheduled Meds:    LOS: 0 days   Lonia Blood, MD [facility] Office  [phone] Pager - Text Page per Loretha Stapler  If 7PM-7AM, please contact night-coverage per Amion 11/26/2021, 7:34 AM

## 2021-11-26 NOTE — ED Notes (Signed)
ED TO INPATIENT HANDOFF REPORT  ED Nurse Name and Phone #: Delorise Shiner [phone]  S Name/Age/Gender Jasmine Cohen 76 y.o. female Room/Bed: 045C/045C  Code Status   Code Status: Full Code  Home/SNF/Other Home Patient oriented to: self, place, and time Is this baseline? No   Triage Complete: Triage complete  Chief Complaint Acute metabolic encephalopathy [G93.41]  Triage Note Pt from home by [facility] for altered mental status. Per son, he left for the grocery store at 1700, returned at 1930; pt reported she did not feel well. EMS VS BP R arm 131/64, L arm 183/115, pulse 90, spO2 98%, cbg 106. Dr. Preston Fleeting in room on arrival.    Allergies Allergies  Allergen Reactions   Codeine Other (See Comments)    Numbness and tingling   Contrast Media [Iodinated Contrast Media] Hives   Vistaril [Hydroxyzine] Other (See Comments)    hallucinations    Level of Care/Admitting Diagnosis ED Disposition     ED Disposition  Admit   Condition  --   Comment  Hospital Area: [facility] [[id number]]  Level of Care: Med-Surg [16]  May place patient in observation at [facility] or [facility] if equivalent level of care is available:: No  Covid Evaluation: Asymptomatic - no recent exposure (last 10 days) testing not required  Diagnosis: Acute metabolic encephalopathy [5366440]  Admitting Physician: Imogene Burn, ERIC [[id number]]  Attending Physician: Imogene Burn, ERIC [[id number]]          B Medical/Surgery History Past Medical History:  Diagnosis Date   Diabetes mellitus without complication (HCC)    GERD (gastroesophageal reflux disease)    Hyperlipidemia    Hypertension    Past Surgical History:  Procedure Laterality Date   ABDOMINAL HYSTERECTOMY     CHOLECYSTECTOMY       A IV Location/Drains/Wounds Patient Lines/Drains/Airways Status     Active Line/Drains/Airways     Name Placement date Placement time Site Days   Peripheral IV 11/26/21 20 G Left Antecubital 11/26/21  0001  Antecubital   less than 1   Peripheral IV 11/26/21 20 G Left;Posterior Hand 11/26/21  0001  Hand  less than 1   Peripheral IV 11/26/21 20 G Posterior;Right Hand 11/26/21  0001  Hand  less than 1   External Urinary Catheter 11/26/21  0405  --  less than 1            Intake/Output Last 24 hours No intake or output data in the 24 hours ending 11/26/21 1530  Labs/Imaging Results for orders placed or performed during the hospital encounter of 11/25/21 (from the past 48 hour(s))  Culture, blood (Routine x 2)     Status: None (Preliminary result)   Collection Time: 11/26/21 12:04 AM   Specimen: BLOOD  Result Value Ref Range   Specimen Description BLOOD LEFT ANTECUBITAL    Special Requests      BOTTLES DRAWN AEROBIC AND ANAEROBIC Blood Culture results may not be optimal due to an inadequate volume of blood received in culture bottles   Culture      NO GROWTH < 12 HOURS Performed at [facility], [address] [place], [place] [address]    Report Status PENDING   Comprehensive metabolic panel     Status: Abnormal   Collection Time: 11/26/21 12:13 AM  Result Value Ref Range   Sodium 138 135 - 145 mmol/L   Potassium 3.9 3.5 - 5.1 mmol/L   Chloride 104 98 - 111 mmol/L   CO2 20 (  L) 22 - 32 mmol/L   Glucose, Bld 139 (H) 70 - 99 mg/dL    Comment: Glucose reference range applies only to samples taken after fasting for at least 8 hours.   BUN 19 8 - 23 mg/dL   Creatinine, Ser 0.30 (H) 0.44 - 1.00 mg/dL   Calcium 9.4 8.9 - 09.2 mg/dL   Total Protein 6.6 6.5 - 8.1 g/dL   Albumin 3.8 3.5 - 5.0 g/dL   AST 25 15 - 41 U/L   ALT 14 0 - 44 U/L   Alkaline Phosphatase 80 38 - 126 U/L   Total Bilirubin 0.7 0.3 - 1.2 mg/dL   GFR, Estimated 45 (L) >60 mL/min    Comment: (NOTE) Calculated using the CKD-EPI Creatinine Equation (2021)    Anion gap 14 5 - 15    Comment: Performed at [facility], [address] [place], [place] [address]  Lactic acid, plasma     Status: None   Collection Time:  11/26/21 12:13 AM  Result Value Ref Range   Lactic Acid, Venous 1.8 0.5 - 1.9 mmol/L    Comment: Performed at [facility], [address] [place], [place] [address]  CBC with Differential     Status: Abnormal   Collection Time: 11/26/21 12:13 AM  Result Value Ref Range   WBC 12.5 (H) 4.0 - 10.5 K/uL   RBC 4.87 3.87 - 5.11 MIL/uL   Hemoglobin 14.4 12.0 - 15.0 g/dL   HCT 33.5 45.6 - 25.6 %   MCV 89.1 80.0 - 100.0 fL   MCH 29.6 26.0 - 34.0 pg   MCHC 33.2 30.0 - 36.0 g/dL   RDW 38.9 37.3 - 42.8 %   Platelets 321 150 - 400 K/uL   nRBC 0.0 0.0 - 0.2 %   Neutrophils Relative % 83 %   Neutro Abs 10.2 (H) 1.7 - 7.7 K/uL   Lymphocytes Relative 10 %   Lymphs Abs 1.3 0.7 - 4.0 K/uL   Monocytes Relative 6 %   Monocytes Absolute 0.8 0.1 - 1.0 K/uL   Eosinophils Relative 0 %   Eosinophils Absolute 0.1 0.0 - 0.5 K/uL   Basophils Relative 1 %   Basophils Absolute 0.1 0.0 - 0.1 K/uL   Immature Granulocytes 0 %   Abs Immature Granulocytes 0.04 0.00 - 0.07 K/uL    Comment: Performed at [facility], [address] [place], [place] [address]  Protime-INR     Status: None   Collection Time: 11/26/21 12:13 AM  Result Value Ref Range   Prothrombin Time 12.8 11.4 - 15.2 seconds   INR 1.0 0.8 - 1.2    Comment: (NOTE) INR goal varies based on device and disease states. Performed at [facility], [address] [place], [place] [address]   Culture, blood (Routine x 2)     Status: None (Preliminary result)   Collection Time: 11/26/21 12:13 AM   Specimen: BLOOD  Result Value Ref Range   Specimen Description BLOOD RIGHT ANTECUBITAL    Special Requests      BOTTLES DRAWN AEROBIC AND ANAEROBIC Blood Culture adequate volume   Culture      NO GROWTH < 12 HOURS Performed at [facility], [address], [place], [place] [address]    Report Status PENDING   APTT     Status: None   Collection Time: 11/26/21 12:13 AM  Result Value Ref Range   aPTT 25 24 - 36  seconds     Comment: Performed at [facility], [address] [place], [place] [address]  Ammonia     Status: None   Collection Time: 11/26/21 12:14 AM  Result Value Ref Range   Ammonia 23 9 - 35 umol/L    Comment: Performed at [facility], [address], [place], [place] [address]  Urinalysis, Routine w reflex microscopic Urine, Catheterized     Status: Abnormal   Collection Time: 11/26/21 12:49 AM  Result Value Ref Range   Color, Urine YELLOW YELLOW   APPearance CLEAR CLEAR   Specific Gravity, Urine 1.018 1.005 - 1.030   pH 5.0 5.0 - 8.0   Glucose, UA NEGATIVE NEGATIVE mg/dL   Hgb urine dipstick NEGATIVE NEGATIVE   Bilirubin Urine NEGATIVE NEGATIVE   Ketones, ur 20 (A) NEGATIVE mg/dL   Protein, ur NEGATIVE NEGATIVE mg/dL   Nitrite NEGATIVE NEGATIVE   Leukocytes,Ua NEGATIVE NEGATIVE    Comment: Performed at [facility], [address], [place], [place] [address]  CBG monitoring, ED     Status: None   Collection Time: 11/26/21  3:19 PM  Result Value Ref Range   Glucose-Capillary 89 70 - 99 mg/dL    Comment: Glucose reference range applies only to samples taken after fasting for at least 8 hours.   CT ABDOMEN PELVIS WO CONTRAST  Result Date: 11/26/2021 CLINICAL DATA:  Acute, nonlocalized abdominal pain EXAM: CT ABDOMEN AND PELVIS WITHOUT CONTRAST TECHNIQUE: Multidetector CT imaging of the abdomen and pelvis was performed following the standard protocol without IV contrast. RADIATION DOSE REDUCTION: This exam was performed according to the departmental dose-optimization program which includes automated exposure control, adjustment of the mA and/or kV according to patient size and/or use of iterative reconstruction technique. COMPARISON:  03/05/2020 FINDINGS: Lower chest:  Coronary calcification. Hepatobiliary: No focal liver abnormality.Cholecystectomy. Pancreas: Diffuse fatty atrophy Spleen: Unremarkable. Adrenals/Urinary Tract: Negative adrenals. No hydronephrosis or stone.  Unremarkable bladder. Stomach/Bowel: No obstruction. Extensive colonic diverticulosis. No evidence of bowel inflammation Vascular/Lymphatic: No acute vascular abnormality. Atheromatous calcification which is generalized along the aorta. No mass or adenopathy. Reproductive:Hysterectomy Other: No ascites or pneumoperitoneum. 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 No progression or mass. Musculoskeletal: No acute abnormalities. Generalized lumbar spine degeneration. Hip osteoarthritis and osteitis pubis. IMPRESSION: 1. No acute finding or change from 2021. 2. Atherosclerosis and colonic diverticulosis Electronically Signed   By: Tiburcio Pea M.D.   On: 11/26/2021 05:19   CT Head Wo Contrast  Result Date: 11/26/2021 CLINICAL DATA:  Altered mental status. EXAM: CT HEAD WITHOUT CONTRAST TECHNIQUE: Contiguous axial images were obtained from the base of the skull through the vertex without intravenous contrast. RADIATION DOSE REDUCTION: This exam was performed according to the departmental dose-optimization program which includes automated exposure control, adjustment of the mA and/or kV according to patient size and/or use of iterative reconstruction technique. COMPARISON:  November 21, 2019 FINDINGS: Brain: There is mild cerebral atrophy with widening of the extra-axial spaces and ventricular dilatation. There are areas of decreased attenuation within the white matter tracts of the supratentorial brain, consistent with microvascular disease changes. Vascular: No hyperdense vessel or unexpected calcification. Skull: A chronic fracture deformity is seen along the bridge of the nasal bone. Sinuses/Orbits: No acute finding. Other: None. IMPRESSION: 1. No acute intracranial abnormality. 2. Generalized cerebral atrophy with chronic white matter small vessel ischemic changes. Electronically Signed   By: Aram Candela M.D.   On: 11/26/2021 02:44  DG Chest Portable 1 View  Result Date:  11/26/2021 CLINICAL DATA:  Shortness of breath and altered mental status. EXAM: PORTABLE CHEST 1 VIEW COMPARISON:  March 05, 2020 FINDINGS: The heart size and mediastinal contours are within normal limits. Both lungs are clear. Multilevel degenerative changes seen throughout the thoracic spine. IMPRESSION: No active cardiopulmonary disease. Electronically Signed   By: Aram Candela M.D.   On: 11/26/2021 00:16    Pending Labs Unresulted Labs (From admission, onward)     Start     Ordered   11/27/21 0500  Comprehensive metabolic panel  Tomorrow morning,   R        11/26/21 1427   11/27/21 0500  Hemoglobin A1c  Tomorrow morning,   R       Comments: To assess prior glycemic control    11/26/21 1427   11/27/21 0500  CBC  Tomorrow morning,   R        11/26/21 1458   11/26/21 0001  Urine Culture  (Undifferentiated presentation (screening labs and basic nursing orders))  ONCE - URGENT,   URGENT       Question:  Indication  Answer:  Sepsis   11/26/21 0001            Vitals/Pain Today's Vitals   11/26/21 1222 11/26/21 1300 11/26/21 1400 11/26/21 1528  BP:  (!) 145/61 (!) 146/62   Pulse:  66 (!) 56   Resp:  (!) 21 18   Temp: 98.1 F (36.7 C)     TempSrc: Oral     SpO2:  95% 98%   PainSc:    0-No pain    Isolation Precautions No active isolations  Medications Medications  lactated ringers infusion ( Intravenous New Bag/Given 11/26/21 1520)  insulin aspart (novoLOG) injection 0-15 Units ( Subcutaneous Not Given 11/26/21 1528)  acetaminophen (TYLENOL) tablet 650 mg (has no administration in time range)    Or  acetaminophen (TYLENOL) suppository 650 mg (has no administration in time range)  ondansetron (ZOFRAN) tablet 4 mg (has no administration in time range)    Or  ondansetron (ZOFRAN) injection 4 mg (has no administration in time range)  enoxaparin (LOVENOX) injection 40 mg (has no administration in time range)    Mobility walks with device Low fall risk   Focused  Assessments Neuro Assessment Handoff:  Swallow screen pass? Yes          Neuro Assessment: Exceptions to WDL Neuro Checks:      Last Documented NIHSS Modified Score:   Has TPA been given? No If patient is a Neuro Trauma and patient is going to OR before floor call report to 4N Charge nurse: [phone] or [phone]   R Recommendations: See Admitting Provider Note  Report given to:   Additional Notes:

## 2021-11-26 NOTE — H&P (Signed)
History and Physical    Jasmine Cohen JJK:[id number] DOB: 1945-07-25 DOA: 11/25/2021  DOS: the patient was seen and examined on 11/25/2021  PCP: Kaleen Mask, MD   Patient coming from: Home  I have personally briefly reviewed patient's old medical records in [facility]  Chief complaint: Altered mental status. History of present illness. 76 year old female brought in by ambulance for altered mental status.  No family is at the bedside.  Reportedly his son went to the patient's house around 7:30 PM.  She was altered.  Patient brought to the ER.  Work-up in the ER was essentially negative.  UA negative for infection.  Chest x-ray negative for pneumonia.  Lactic acid is normal.  She did have a mild fever of 100.4.  Sodium 138, bicarb of 20, BUN of 19, creatinine 1.25  White count of 12.5, hemoglobin 14.4, platelets of 321  Lactic acid of 1.8  CT head showed no acute intracranial abnormalities.  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  Chest x-ray negative for acute cardiopulmonary disease.  My interpretation the patient's EKG shows normal sinus rhythm.  Due to patient's continued altered mental status, [facility] hospitalist contacted for admission.    ED Course: workup negative for source of altered mental status  Review of Systems:  Review of Systems  Unable to perform ROS: Mental status change    Past Medical History:  Diagnosis Date   Diabetes mellitus without complication (HCC)    GERD (gastroesophageal reflux disease)    Hyperlipidemia    Hypertension     Past Surgical History:  Procedure Laterality Date   ABDOMINAL HYSTERECTOMY     CHOLECYSTECTOMY       reports that she has never smoked. She has never used smokeless tobacco. She reports that she does not drink alcohol and does not use drugs.  Allergies  Allergen Reactions   Codeine Other (See Comments)    Numbness and tingling   Contrast Media [Iodinated  Contrast Media] Hives   Vistaril [Hydroxyzine] Other (See Comments)    hallucinations    Family History  Problem Relation Age of Onset   Breast cancer Mother    Diabetes Mellitus II Neg Hx     Prior to Admission medications   Medication Sig Start Date End Date Taking? Authorizing Provider  ACCU-CHEK AVIVA PLUS test strip 1 each daily. 01/21/20   [provider]  Accu-Chek FastClix Lancets MISC Apply topically. 01/21/20   [provider]  acetaminophen (TYLENOL) 650 MG CR tablet Take 650 mg by mouth as needed for pain.     [provider]  atorvastatin (LIPITOR) 40 MG tablet Take 40 mg by mouth at bedtime.  12/24/13   [provider]  JANUVIA 50 MG tablet Take 50 mg by mouth daily. 02/28/20   [provider]  lisinopril (PRINIVIL,ZESTRIL) 20 MG tablet Take 20 mg by mouth daily.     [provider]  OVER THE COUNTER MEDICATION Take 1 tablet by mouth daily. Eye vitamin    [provider]  OVER THE COUNTER MEDICATION Place 1 drop into both eyes daily. Eye drop for  to help with degeneration    [provider]  pantoprazole (PROTONIX) 20 MG tablet Take 20 mg by mouth 2 (two) times daily.    [provider]  saccharomyces boulardii (FLORASTOR) 250 MG capsule Take 250 mg by mouth daily.    [provider]  triamcinolone cream (KENALOG) 0.1 % Apply 1 application  topically 2 (two) times daily. 07/27/20   Wieters, Junius Creamer, PA-C  Vitamin D-Vitamin K (VITAMIN K2-VITAMIN D3 PO)     [provider]    Physical Exam: Vitals:   11/26/21 0115 11/26/21 0130 11/26/21 0145 11/26/21 0315  BP: (!) 160/70 (!) 162/65 (!) 150/71 (!) 149/54  Pulse: 79 79 72 82  Resp: 15 16 12  (!) 23  Temp:      TempSrc:      SpO2: 100% 99% 99% 92%    Physical Exam Vitals and nursing note reviewed.  Constitutional:      General: She is not in acute distress.    Appearance: She is obese. She is not ill-appearing,  toxic-appearing or diaphoretic.  HENT:     Head: Normocephalic and atraumatic.     Nose: Nose normal.  Eyes:     Pupils: Pupils are equal, round, and reactive to light.  Cardiovascular:     Rate and Rhythm: Normal rate and regular rhythm.     Pulses: Normal pulses.  Pulmonary:     Effort: Pulmonary effort is normal. No respiratory distress.     Breath sounds: Normal breath sounds. No wheezing or rales.  Abdominal:     General: Bowel sounds are normal. There is no distension.     Tenderness: There is abdominal tenderness in the left lower quadrant. There is no guarding or rebound.  Musculoskeletal:     Right lower leg: No edema.     Left lower leg: No edema.  Skin:    General: Skin is warm and dry.  Neurological:     Mental Status: She is disoriented.      Labs on Admission: I have personally reviewed following labs and imaging studies  CBC: Recent Labs  Lab 11/26/21 0013  WBC 12.5*  NEUTROABS 10.2*  HGB 14.4  HCT 43.4  MCV 89.1  PLT 321   Basic Metabolic Panel: Recent Labs  Lab 11/26/21 0013  NA 138  K 3.9  CL 104  CO2 20*  GLUCOSE 139*  BUN 19  CREATININE 1.25*  CALCIUM 9.4   GFR: CrCl cannot be calculated (Unknown ideal weight.). Liver Function Tests: Recent Labs  Lab 11/26/21 0013  AST 25  ALT 14  ALKPHOS 80  BILITOT 0.7  PROT 6.6  ALBUMIN 3.8   No results for input(s): "LIPASE", "AMYLASE" in the last 168 hours. Recent Labs  Lab 11/26/21 0014  AMMONIA 23   Coagulation Profile: Recent Labs  Lab 11/26/21 0013  INR 1.0   Cardiac Enzymes: No results for input(s): "CKTOTAL", "CKMB", "CKMBINDEX", "TROPONINI", "TROPONINIHS" in the last 168 hours. BNP (last 3 results) No results for input(s): "PROBNP" in the last 8760 hours. HbA1C: No results for input(s): "HGBA1C" in the last 72 hours. CBG: No results for input(s): "GLUCAP" in the last 168 hours. Lipid Profile: No results for input(s): "CHOL", "HDL", "LDLCALC", "TRIG", "CHOLHDL",  "LDLDIRECT" in the last 72 hours. Thyroid Function Tests: No results for input(s): "TSH", "T4TOTAL", "FREET4", "T3FREE", "THYROIDAB" in the last 72 hours. Anemia Panel: No results for input(s): "VITAMINB12", "FOLATE", "FERRITIN", "TIBC", "IRON", "RETICCTPCT" in the last 72 hours. Urine analysis:    Component Value Date/Time   COLORURINE YELLOW 11/26/2021 0049   APPEARANCEUR CLEAR 11/26/2021 0049   LABSPEC 1.018 11/26/2021 0049   PHURINE 5.0 11/26/2021 0049   GLUCOSEU NEGATIVE 11/26/2021 0049   HGBUR NEGATIVE 11/26/2021 0049   BILIRUBINUR NEGATIVE 11/26/2021 0049   KETONESUR 20 (A) 11/26/2021 0049   PROTEINUR NEGATIVE 11/26/2021 0049  UROBILINOGEN 1.0 03/26/2014 2319   NITRITE NEGATIVE 11/26/2021 0049   LEUKOCYTESUR NEGATIVE 11/26/2021 0049    Radiological Exams on Admission: I have personally reviewed images CT Head Wo Contrast  Result Date: 11/26/2021 CLINICAL DATA:  Altered mental status. EXAM: CT HEAD WITHOUT CONTRAST TECHNIQUE: Contiguous axial images were obtained from the base of the skull through the vertex without intravenous contrast. RADIATION DOSE REDUCTION: This exam was performed according to the departmental dose-optimization program which includes automated exposure control, adjustment of the mA and/or kV according to patient size and/or use of iterative reconstruction technique. COMPARISON:  November 21, 2019 FINDINGS: Brain: There is mild cerebral atrophy with widening of the extra-axial spaces and ventricular dilatation. There are areas of decreased attenuation within the white matter tracts of the supratentorial brain, consistent with microvascular disease changes. Vascular: No hyperdense vessel or unexpected calcification. Skull: A chronic fracture deformity is seen along the bridge of the nasal bone. Sinuses/Orbits: No acute finding. Other: None. IMPRESSION: 1. No acute intracranial abnormality. 2. Generalized cerebral atrophy with chronic white matter small vessel  ischemic changes. Electronically Signed   By: Aram Candela M.D.   On: 11/26/2021 02:44   DG Chest Portable 1 View  Result Date: 11/26/2021 CLINICAL DATA:  Shortness of breath and altered mental status. EXAM: PORTABLE CHEST 1 VIEW COMPARISON:  March 05, 2020 FINDINGS: The heart size and mediastinal contours are within normal limits. Both lungs are clear. Multilevel degenerative changes seen throughout the thoracic spine. IMPRESSION: No active cardiopulmonary disease. Electronically Signed   By: Aram Candela M.D.   On: 11/26/2021 00:16    EKG: My personal interpretation of EKG shows: NSR    Assessment/Plan Principal Problem:   Acute metabolic encephalopathy Active Problems:   Abdominal pain   Diabetes mellitus type 2, controlled (HCC)   Essential hypertension   Morbid obesity (HCC)    Assessment and Plan: * Acute metabolic encephalopathy Admit to observation MedSurg bed.  Unclear the cause of the patient's acute metabolic encephalopathy.  Based on her home med list (has not been reconciled yet) she is not on any medicines that would alter her mentation.  She states that she was unable to talk but she has no other focal findings.  She is able to talk now.??TIA??.  CT scan was negative.  May need an MRI??  No source of infection.  She does have some abdominal pain.  We will check a CT scan of the abdomen.  Abdominal pain Patient has some pain with palpation of her generous abdomen.  No rebound.  We will check a CT noncontrasted to further evaluate.  Hold off on antibiotics for now.  She does have a prior history of diverticulitis.  Morbid obesity (HCC) Chronic.  Essential hypertension Stable.  Hold lisinopril for now given the mild increase in her serum creatinine.  Diabetes mellitus type 2, controlled (HCC) Add sliding scale insulin.  Check A1c.   DVT prophylaxis: SQ Heparin Code Status: Full Code by default. No family members at bedside. Pt not competent to make  medical decisions at this time Family Communication: no family at bedside  Disposition Plan: return home  Consults called: none  Admission status: Observation, Med-Surg   Carollee Herter, DO [facility] 11/26/2021, 4:11 AM

## 2021-11-26 NOTE — ED Provider Notes (Signed)
[facility] EMERGENCY DEPARTMENT Provider Note   CSN: [id number] Arrival date & time: 11/25/21  2348     History  Chief Complaint  Patient presents with   Altered Mental Status    Jasmine Cohen is a 76 y.o. female.  The history is provided by the EMS personnel. The history is limited by the condition of the patient (Altered mental status).  Altered Mental Status She has history of hypertension, diabetes, hyperlipidemia was brought in by ambulance because of altered mental status.  She was last seen normal at about 5:30 PM.  Her son came home at about 9:30 and found her with completely altered mentation.  She has been noncommunicative.  EMS reports that she seems to move all extremities equally but she has nonspecific crying out if she is touched anywhere.  They did note that she was somewhat warm to touch and noted blood pressure differences with the left arm having blood pressure 50 mm greater than right arm.  Patient is totally unable to give any history.   Home Medications Prior to Admission medications   Medication Sig Start Date End Date Taking? Authorizing Provider  ACCU-CHEK AVIVA PLUS test strip 1 each daily. 01/21/20   [provider]  Accu-Chek FastClix Lancets MISC Apply topically. 01/21/20   [provider]  acetaminophen (TYLENOL) 650 MG CR tablet Take 650 mg by mouth as needed for pain.     [provider]  atorvastatin (LIPITOR) 40 MG tablet Take 40 mg by mouth at bedtime.  12/24/13   [provider]  JANUVIA 50 MG tablet Take 50 mg by mouth daily. 02/28/20   [provider]  lisinopril (PRINIVIL,ZESTRIL) 20 MG tablet Take 20 mg by mouth daily.     [provider]  OVER THE COUNTER MEDICATION Take 1 tablet by mouth daily. Eye vitamin    [provider]  OVER THE COUNTER MEDICATION Place 1 drop into both eyes daily. Eye drop for  to help with degeneration    [provider]   pantoprazole (PROTONIX) 20 MG tablet Take 20 mg by mouth 2 (two) times daily.    [provider]  saccharomyces boulardii (FLORASTOR) 250 MG capsule Take 250 mg by mouth daily.    [provider]  triamcinolone cream (KENALOG) 0.1 % Apply 1 application topically 2 (two) times daily. 07/27/20   Wieters, Hallie C, PA-C  Vitamin D-Vitamin K (VITAMIN K2-VITAMIN D3 PO)     [provider]      Allergies    Codeine, Contrast media [iodinated contrast media], and Vistaril [hydroxyzine]    Review of Systems   Review of Systems  Unable to perform ROS: Mental status change    Physical Exam Updated Vital Signs BP (!) 162/73 (BP Location: Right Arm)   Pulse 78   Temp 98.3 F (36.8 C) (Oral)   Resp (!) 30   SpO2 100%  Physical Exam Vitals and nursing note reviewed.   76 year old female, resting comfortably but agitated when touched, but in no acute distress. Vital signs are significant for elevated blood pressure and respiratory rate. Oxygen saturation is 100%, which is normal.  She is noted to be warm to the touch, will need to check rectal temperature. Head is normocephalic and atraumatic. PERRLA, EOMI. Oropharynx is clear. Neck is nontender and supple without adenopathy or JVD. Back is nontender and there is no CVA tenderness. Lungs are clear without rales, wheezes, or rhonchi. Chest is nontender. Heart  has regular rate and rhythm without murmur. Abdomen is soft, flat. Extremities have no cyanosis or edema, full range of motion is present. Skin is warm and dry without rash. Neurologic: Awake, agitated when disturbed in any way, but moves all extremities equally.  ED Results / Procedures / Treatments   Labs (all labs ordered are listed, but only abnormal results are displayed) Labs Reviewed  COMPREHENSIVE METABOLIC PANEL - Abnormal; Notable for the following components:      Result Value   CO2 20 (*)    Glucose, Bld 139 (*)    Creatinine, Ser 1.25 (*)     GFR, Estimated 45 (*)    All other components within normal limits  CBC WITH DIFFERENTIAL/PLATELET - Abnormal; Notable for the following components:   WBC 12.5 (*)    Neutro Abs 10.2 (*)    All other components within normal limits  URINALYSIS, ROUTINE W REFLEX MICROSCOPIC - Abnormal; Notable for the following components:   Ketones, ur 20 (*)    All other components within normal limits  CULTURE, BLOOD (ROUTINE X 2)  CULTURE, BLOOD (ROUTINE X 2)  URINE CULTURE  LACTIC ACID, PLASMA  PROTIME-INR  APTT  AMMONIA    EKG EKG Interpretation  Date/Time:  Wednesday November 25 2021 23:55:01 EDT Ventricular Rate:  78 PR Interval:  154 QRS Duration: 89 QT Interval:  393 QTC Calculation: 448 R Axis:   58 Text Interpretation: Sinus rhythm Atrial premature complex When compared with ECG of 03/05/2020, No significant change was found Confirmed by Dione Booze ([id number]) on 11/26/2021 12:00:02 AM  Radiology CT ABDOMEN PELVIS WO CONTRAST  Result Date: 11/26/2021 CLINICAL DATA:  Acute, nonlocalized abdominal pain EXAM: CT ABDOMEN AND PELVIS WITHOUT CONTRAST TECHNIQUE: Multidetector CT imaging of the abdomen and pelvis was performed following the standard protocol without IV contrast. RADIATION DOSE REDUCTION: This exam was performed according to the departmental dose-optimization program which includes automated exposure control, adjustment of the mA and/or kV according to patient size and/or use of iterative reconstruction technique. COMPARISON:  03/05/2020 FINDINGS: Lower chest:  Coronary calcification. Hepatobiliary: No focal liver abnormality.Cholecystectomy. Pancreas: Diffuse fatty atrophy Spleen: Unremarkable. Adrenals/Urinary Tract: Negative adrenals. No hydronephrosis or stone. Unremarkable bladder. Stomach/Bowel: No obstruction. Extensive colonic diverticulosis. No evidence of bowel inflammation Vascular/Lymphatic: No acute vascular abnormality. Atheromatous calcification which is generalized along  the aorta. No mass or adenopathy. Reproductive:Hysterectomy Other: No ascites or pneumoperitoneum. 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 No progression or mass. Musculoskeletal: No acute abnormalities. Generalized lumbar spine degeneration. Hip osteoarthritis and osteitis pubis. IMPRESSION: 1. No acute finding or change from 2021. 2. Atherosclerosis and colonic diverticulosis Electronically Signed   By: Tiburcio Pea M.D.   On: 11/26/2021 05:19   CT Head Wo Contrast  Result Date: 11/26/2021 CLINICAL DATA:  Altered mental status. EXAM: CT HEAD WITHOUT CONTRAST TECHNIQUE: Contiguous axial images were obtained from the base of the skull through the vertex without intravenous contrast. RADIATION DOSE REDUCTION: This exam was performed according to the departmental dose-optimization program which includes automated exposure control, adjustment of the mA and/or kV according to patient size and/or use of iterative reconstruction technique. COMPARISON:  November 21, 2019 FINDINGS: Brain: There is mild cerebral atrophy with widening of the extra-axial spaces and ventricular dilatation. There are areas of decreased attenuation within the white matter tracts of the supratentorial brain, consistent with microvascular disease changes. Vascular: No hyperdense vessel or unexpected calcification. Skull: A chronic fracture deformity is seen along the bridge of the nasal  bone. Sinuses/Orbits: No acute finding. Other: None. IMPRESSION: 1. No acute intracranial abnormality. 2. Generalized cerebral atrophy with chronic white matter small vessel ischemic changes. Electronically Signed   By: Aram Candela M.D.   On: 11/26/2021 02:44   DG Chest Portable 1 View  Result Date: 11/26/2021 CLINICAL DATA:  Shortness of breath and altered mental status. EXAM: PORTABLE CHEST 1 VIEW COMPARISON:  March 05, 2020 FINDINGS: The heart size and mediastinal contours are within normal limits. Both lungs are  clear. Multilevel degenerative changes seen throughout the thoracic spine. IMPRESSION: No active cardiopulmonary disease. Electronically Signed   By: Aram Candela M.D.   On: 11/26/2021 00:16    Procedures Procedures  Cardiac monitor shows normal sinus rhythm, per my interpretation.  Medications Ordered in ED Medications - No data to display  ED Course/ Medical Decision Making/ A&P                           Medical Decision Making Amount and/or Complexity of Data Reviewed Labs: ordered. Radiology: ordered.  Risk Decision regarding hospitalization.   Acute mental status change of uncertain cause.  No focal findings to suggest stroke.  Although she is afebrile, she is warm to touch and I am concerned about possible occult sepsis, so she is started on the evolving sepsis pathway.  Consider metabolic encephalopathy of variety of causes.  CBG by EMS showed no evidence of hypoglycemia.  No history of liver disease to suggest hepatic encephalopathy.  Her medication list contains no medications that are likely to be psychoactive, no history of illicit drug use although that is still a possibility.  Old records are reviewed, and she has 2 hospitalizations for mental status change.  On 03/13/2019 she was admitted for altered mental status which was due to sepsis.  On 11/21/2019 she was admitted for mental status which was felt to be due to dehydration and acute kidney injury.  I have reviewed and interpreted her ECG and my interpretation is sinus rhythm with PAC and otherwise normal ECG.  I have reviewed and interpreted her laboratory work-up and my interpretation is mild leukocytosis which is nonspecific, mildly elevated glucose and creatinine which are not significantly changed from prior, normal ammonia level.  Chest x-ray shows no active cardiopulmonary disease, CT of head shows cerebral atrophy without acute process.  I have independently viewed all of the images, and agree with radiologist  interpretation.  Patient was reevaluated, continues to have significantly altered mental status and is not awake.  Cause is not clear at this time, but does appear to be some type of metabolic encephalopathy.  Case is discussed with Dr. Imogene Burn of [facility], who agrees to admit the patient.  Final Clinical Impression(s) / ED Diagnoses Final diagnoses:  Altered mental status, unspecified altered mental status type  Renal insufficiency    Rx / DC Orders ED Discharge Orders     None         Dione Booze, MD 11/26/21 [phone]

## 2021-11-26 NOTE — Assessment & Plan Note (Addendum)
Patient has some pain with palpation of her generous abdomen.  No rebound.  We will check a CT noncontrasted to further evaluate.  Hold off on antibiotics for now.  She does have a prior history of diverticulitis.

## 2021-11-26 NOTE — ED Notes (Signed)
Patient transported to CT 

## 2021-11-26 NOTE — ED Notes (Signed)
Patient granddaughter  Cordelia Pen. called and update given .

## 2021-11-26 NOTE — Assessment & Plan Note (Signed)
Admit to observation MedSurg bed.  Unclear the cause of the patient's acute metabolic encephalopathy.  Based on her home med list (has not been reconciled yet) she is not on any medicines that would alter her mentation.  She states that she was unable to talk but she has no other focal findings.  She is able to talk now.??TIA??.  CT scan was negative.  May need an MRI??  No source of infection.  She does have some abdominal pain.  We will check a CT scan of the abdomen.

## 2021-11-26 NOTE — Assessment & Plan Note (Signed)
Add sliding scale insulin.  Check A1c.

## 2021-11-27 DIAGNOSIS — G9341 Metabolic encephalopathy: Secondary | ICD-10-CM | POA: Diagnosis not present

## 2021-11-27 LAB — COMPREHENSIVE METABOLIC PANEL
ALT: 15 U/L (ref 0–44)
AST: 18 U/L (ref 15–41)
Albumin: 3.4 g/dL — ABNORMAL LOW (ref 3.5–5.0)
Alkaline Phosphatase: 69 U/L (ref 38–126)
Anion gap: 7 (ref 5–15)
BUN: 12 mg/dL (ref 8–23)
CO2: 26 mmol/L (ref 22–32)
Calcium: 8.9 mg/dL (ref 8.9–10.3)
Chloride: 107 mmol/L (ref 98–111)
Creatinine, Ser: 1.13 mg/dL — ABNORMAL HIGH (ref 0.44–1.00)
GFR, Estimated: 50 mL/min — ABNORMAL LOW (ref >60)
Glucose, Bld: 91 mg/dL (ref 70–99)
Potassium: 3.5 mmol/L (ref 3.5–5.1)
Sodium: 140 mmol/L (ref 135–145)
Total Bilirubin: 0.9 mg/dL (ref 0.3–1.2)
Total Protein: 6 g/dL — ABNORMAL LOW (ref 6.5–8.1)

## 2021-11-27 LAB — GLUCOSE, CAPILLARY
Glucose-Capillary: 106 mg/dL — ABNORMAL HIGH (ref 70–99)
Glucose-Capillary: 110 mg/dL — ABNORMAL HIGH (ref 70–99)
Glucose-Capillary: 119 mg/dL — ABNORMAL HIGH (ref 70–99)
Glucose-Capillary: 152 mg/dL — ABNORMAL HIGH (ref 70–99)
Glucose-Capillary: 86 mg/dL (ref 70–99)
Glucose-Capillary: 90 mg/dL (ref 70–99)

## 2021-11-27 LAB — URINE CULTURE: Culture: NO GROWTH

## 2021-11-27 LAB — CBC
HCT: 40 % (ref 36.0–46.0)
Hemoglobin: 13.3 g/dL (ref 12.0–15.0)
MCH: 29.8 pg (ref 26.0–34.0)
MCHC: 33.3 g/dL (ref 30.0–36.0)
MCV: 89.5 fL (ref 80.0–100.0)
Platelets: 281 10*3/uL (ref 150–400)
RBC: 4.47 MIL/uL (ref 3.87–5.11)
RDW: 13.6 % (ref 11.5–15.5)
WBC: 6.9 10*3/uL (ref 4.0–10.5)
nRBC: 0 % (ref 0.0–0.2)

## 2021-11-27 LAB — HEMOGLOBIN A1C
Hgb A1c MFr Bld: 6.3 % — ABNORMAL HIGH (ref 4.8–5.6)
Mean Plasma Glucose: 134.11 mg/dL

## 2021-11-27 MED ORDER — INSULIN ASPART 100 UNIT/ML IJ SOLN
0.0000 [IU] | Freq: Three times a day (TID) | INTRAMUSCULAR | Status: DC
  Administered 2021-11-27: 2 [IU] via SUBCUTANEOUS
  Administered 2021-11-28: 1 [IU] via SUBCUTANEOUS

## 2021-11-27 MED ORDER — LISINOPRIL 20 MG PO TABS
20.0000 mg | ORAL_TABLET | Freq: Every day | ORAL | Status: DC
  Administered 2021-11-27 – 2021-11-28 (×2): 20 mg via ORAL
  Filled 2021-11-27 (×2): qty 1

## 2021-11-27 NOTE — Care Management Obs Status (Signed)
MEDICARE OBSERVATION STATUS NOTIFICATION   Patient Details  Name: Jasmine Cohen MRN: [id number] Date of Birth: 04/11/1945   Medicare Observation Status Notification Given:  Yes    Lawerance Sabal, RN 11/27/2021, 1:47 PM

## 2021-11-27 NOTE — Progress Notes (Signed)
Jasmine Cohen  IRC:[id number] DOB: [birth year] DOA: 11/25/2021 PCP: Kaleen Mask, MD    Brief Narrative:  76 year old with a history of DM2, HLD, and HTN who was brought to the ER via EMS with altered mental status.  The patient's son arrived at her home at 7:30 PM and found her to be confused.  In the ER a UA was unrevealing and a CXR was normal.  CT head noted no acute intracranial abnormalities but did note small vessel ischemic disease and generalized cerebral atrophy.  Consultants:  None  Goals of Care:  Code Status: Full Code   DVT prophylaxis: Lovenox   Interim Hx: Afebrile.  Vital signs are stable.  CBG stable within normal range.  The patient is alert pleasant and even jovial at time of visit.  She is anxious to go home.  She tells me she is completely back to normal.  She denies chest pain nausea vomiting headache abdominal pain.  She reports a good appetite.  Assessment & Plan:  Acute metabolic encephalopathy of unknown etiology she is not on any medicines that would alter her mentation - CT head was negative - ammonia normal - support w/ hydration and monitor - PT/OT evals   Abdominal pain CT abdomen/pelvis without acute findings -resolved at time of exam today  Morbid obesity - There is no height or weight on file to calculate BMI.  Essential hypertension Blood pressure trending upward -adjust medical therapy and follow  Diabetes mellitus type 2 A1c 6.3 -monitor CBG closely  Family Communication: no family present at time of visit Disposition: continue observation - PT/OT evals - lives independently -possible discharge home 9/2   Objective: Blood pressure 136/66, pulse 67, temperature 97.7 F (36.5 C), temperature source Oral, resp. rate 18, SpO2 95 %.  Intake/Output Summary (Last 24 hours) at 11/27/2021 1737 Last data filed at 11/27/2021 0031 Gross per 24 hour  Intake --  Output 1000 ml  Net -1000 ml   There were no vitals filed for this  visit.  Examination: General: No acute respiratory distress Lungs: Clear to auscultation bilaterally without wheezes or crackles Cardiovascular: Regular rate and rhythm without murmur gallop or rub normal S1 and S2 Abdomen: Nontender, nondistended, soft, bowel sounds positive, no rebound, no ascites, no appreciable mass Extremities: No significant cyanosis, clubbing, or edema bilateral lower extremities  CBC: Recent Labs  Lab 11/26/21 0013 11/27/21 0745  WBC 12.5* 6.9  NEUTROABS 10.2*  --   HGB 14.4 13.3  HCT 43.4 40.0  MCV 89.1 89.5  PLT 321 281   Basic Metabolic Panel: Recent Labs  Lab 11/26/21 0013 11/27/21 0745  NA 138 140  K 3.9 3.5  CL 104 107  CO2 20* 26  GLUCOSE 139* 91  BUN 19 12  CREATININE 1.25* 1.13*  CALCIUM 9.4 8.9   GFR: CrCl cannot be calculated (Unknown ideal weight.).  Liver Function Tests: Recent Labs  Lab 11/26/21 0013 11/27/21 0745  AST 25 18  ALT 14 15  ALKPHOS 80 69  BILITOT 0.7 0.9  PROT 6.6 6.0*  ALBUMIN 3.8 3.4*    HbA1C: Hgb A1c MFr Bld  Date/Time Value Ref Range Status  11/27/2021 07:45 AM 6.3 (H) 4.8 - 5.6 % Final    Comment:    (NOTE) Pre diabetes:          5.7%-6.4%  Diabetes:              >6.4%  Glycemic control for   <7.0% adults with diabetes  11/21/2019 01:23 PM 6.9 (H) 4.8 - 5.6 % Final    Comment:    (NOTE) Pre diabetes:          5.7%-6.4%  Diabetes:              >6.4%  Glycemic control for   <7.0% adults with diabetes     LOS: 0 days   Lonia Blood, MD [facility] Office  [phone] Pager - Text Page per Amion  If 7PM-7AM, please contact night-coverage per Amion 11/27/2021, 5:37 PM

## 2021-11-27 NOTE — Evaluation (Signed)
Physical Therapy Evaluation Patient Details Name: Jasmine Cohen MRN: [id number] DOB: 21-Jun-1945 Today's Date: 11/27/2021  History of Present Illness  76 yo female presents to [facility] on 8/30 for AMS, workup for AME. PMH of HTN, DM 2, morbid obesity.  Clinical Impression  Pt presents with generalized weakness, impaired balance, poor safety awareness, and decreased activity tolerance vs baseline. Pt to benefit from acute PT to address deficits. Pt ambulated hallway distance with use of RW, pt reports using cane as needed at baseline but anticipate pt uses environment to self-steady frequently. PT recommending HHPT to maximize safety in home and address deficits. PT to progress mobility as tolerated, and will continue to follow acutely.         Recommendations for follow up therapy are one component of a multi-disciplinary discharge planning process, led by the attending physician.  Recommendations may be updated based on patient status, additional functional criteria and insurance authorization.  Follow Up Recommendations Home health PT      Assistance Recommended at Discharge PRN  Patient can return home with the following  A little help with walking and/or transfers    Equipment Recommendations None recommended by PT  Recommendations for Other Services       Functional Status Assessment Patient has had a recent decline in their functional status and demonstrates the ability to make significant improvements in function in a reasonable and predictable amount of time.     Precautions / Restrictions Precautions Precautions: Fall Restrictions Weight Bearing Restrictions: No      Mobility  Bed Mobility Overal bed mobility: Needs Assistance Bed Mobility: Supine to Sit     Supine to sit: Min guard, HOB elevated     General bed mobility comments: for safety, cues for sequencing    Transfers Overall transfer level: Needs assistance Equipment used: Rolling walker (2  wheels) Transfers: Sit to/from Stand Sit to Stand: Min assist           General transfer comment: light rise assist with initial stand    Ambulation/Gait Ambulation/Gait assistance: Min assist, Min guard Gait Distance (Feet): 170 Feet Assistive device: Rolling walker (2 wheels) Gait Pattern/deviations: Step-through pattern, Decreased stride length, Trunk flexed, Knee flexed in stance - left, Knee flexed in stance - right Gait velocity: decr     General Gait Details: initially min assist to steady, reinforce proper use of RW as pt frequently letting go of walker during gait  Stairs            Wheelchair Mobility    Modified Rankin (Stroke Patients Only)       Balance Overall balance assessment: Needs assistance, History of Falls Sitting-balance support: No upper extremity supported, Feet supported Sitting balance-Leahy Scale: Fair     Standing balance support: Bilateral upper extremity supported, During functional activity Standing balance-Leahy Scale: Poor                               Pertinent Vitals/Pain Pain Assessment Pain Assessment: No/denies pain    Home Living Family/patient expects to be discharged to:: Private residence Living Arrangements: Alone Available Help at Discharge: Family Type of Home: Mobile home Home Access: Ramped entrance       Home Layout: One level Home Equipment: Rollator (4 wheels);Rolling Walker (2 wheels);Shower seat;Cane - single point      Prior Function Prior Level of Function : Independent/Modified Independent  Mobility Comments: pt reports using cane sometimes, pt does not drive so her grandson takes her to appointments       Hand Dominance   Dominant Hand: Right    Extremity/Trunk Assessment   Upper Extremity Assessment Upper Extremity Assessment: Defer to OT evaluation    Lower Extremity Assessment Lower Extremity Assessment: Generalized weakness    Cervical / Trunk  Assessment Cervical / Trunk Assessment: Normal  Communication   Communication: HOH  Cognition Arousal/Alertness: Awake/alert Behavior During Therapy: WFL for tasks assessed/performed Overall Cognitive Status: No family/caregiver present to determine baseline cognitive functioning                                 General Comments: Highly distractible, safety deficits throughout mobility with use of RW        General Comments      Exercises     Assessment/Plan    PT Assessment Patient needs continued PT services  PT Problem List Decreased strength;Decreased mobility;Decreased safety awareness;Decreased activity tolerance;Decreased balance;Decreased knowledge of use of DME       PT Treatment Interventions DME instruction;Therapeutic activities;Gait training;Therapeutic exercise;Patient/family education;Balance training;Stair training;Functional mobility training;Neuromuscular re-education    PT Goals (Current goals can be found in the Care Plan section)  Acute Rehab PT Goals Patient Stated Goal: home PT Goal Formulation: With patient Time For Goal Achievement: 12/11/21 Potential to Achieve Goals: Good    Frequency Min 3X/week     Co-evaluation               AM-PAC PT "6 Clicks" Mobility  Outcome Measure Help needed turning from your back to your side while in a flat bed without using bedrails?: A Little Help needed moving from lying on your back to sitting on the side of a flat bed without using bedrails?: A Little Help needed moving to and from a bed to a chair (including a wheelchair)?: A Little Help needed standing up from a chair using your arms (e.g., wheelchair or bedside chair)?: A Little Help needed to walk in hospital room?: A Little Help needed climbing 3-5 steps with a railing? : A Lot 6 Click Score: 17    End of Session   Activity Tolerance: Patient tolerated treatment well Patient left: in chair;with call bell/phone within reach;with  chair alarm set Nurse Communication: Mobility status PT Visit Diagnosis: Other abnormalities of gait and mobility (R26.89);Muscle weakness (generalized) (M62.81)    Time: 6759-1638 PT Time Calculation (min) (ACUTE ONLY): 24 min   Charges:   PT Evaluation $PT Eval Low Complexity: 1 Low        Starlette Thurow S, PT DPT Acute Rehabilitation Services Pager [phone]  Office [phone]   Adrik Khim E Christain [place] 11/27/2021, 9:25 AM

## 2021-11-27 NOTE — TOC Initial Note (Addendum)
Transition of Care [facility]) - Initial/Assessment Note    Patient Details  Name: Jasmine Cohen MRN: [id number] Date of Birth: 1945/10/27  Transition of Care [facility]) CM/SW Contact:    Lawerance Sabal, RN Phone Number: 11/27/2021, 9:31 AM  Clinical Narrative:           Verified with [facility] that patient is active for [facility] PT.  Spoke w patient and she would like to continue [facility] services. She has a RW and shower seat at home, lives alone and feels she can return to current living situation.  Will need resumption orders for [facility] therapies         Expected Discharge Plan: Home w Home Health Services Barriers to Discharge: Continued Medical Work up   Patient Goals and CMS Choice        Expected Discharge Plan and Services Expected Discharge Plan: Home w Home Health Services   Discharge Planning Services: CM Consult   Living arrangements for the past 2 months: Single Family Home                                      Prior Living Arrangements/Services Living arrangements for the past 2 months: Single Family Home Lives with:: Self              Current home services: Home PT, DME ([facility])    Activities of Daily Living      Permission Sought/Granted                  Emotional Assessment              Admission diagnosis:  Renal insufficiency [N28.9] Altered mental status, unspecified altered mental status type [R41.82] Acute metabolic encephalopathy [G93.41] Patient Active Problem List   Diagnosis Date Noted   Morbid obesity (HCC) 11/26/2021   Acute metabolic encephalopathy 03/13/2019   Essential hypertension 03/13/2019   Type 2 diabetes mellitus without complication (HCC) 03/13/2019   Abdominal pain 03/27/2014   Leucocytosis 03/27/2014   Diabetes mellitus type 2, controlled (HCC) 03/27/2014   Hyperlipidemia 03/27/2014   Fall 11/27/2012   Avulsed toenail 11/27/2012   Contusion of left knee 11/27/2012   PCP:  Kaleen Mask, MD Pharmacy:    [id number] [address]), [place] [address] [place] ([place]) [place] [address] Phone: [phone] Fax: [phone]     Social Determinants of Health (SDOH) Interventions    Readmission Risk Interventions     No data to display

## 2021-11-27 NOTE — Evaluation (Signed)
Occupational Therapy Evaluation Patient Details Name: Jasmine Cohen MRN: [id number] DOB: Mar 03, 1946 Today's Date: 11/27/2021   History of Present Illness 76 yo female presents to [facility] on 8/30 for AMS, workup for AME. PMH of HTN, DM 2, morbid obesity.   Clinical Impression   Pt scored 4/5 on Mini-Cog, did not recall one word after drawing clock without error. She had no errors on Short Blessed Test. Clinically, pt presents with impaired safety awareness which places her at risk for falls at home. Pt is overall functioning at up to a min assist level, primarily for safety. Recommending HHOT to address safety and IADLs in the home setting as pt lives alone with intermittent visits by her grandson. Will follow acutely.      Recommendations for follow up therapy are one component of a multi-disciplinary discharge planning process, led by the attending physician.  Recommendations may be updated based on patient status, additional functional criteria and insurance authorization.   Follow Up Recommendations  Home health OT    Assistance Recommended at Discharge Intermittent Supervision/Assistance  Patient can return home with the following Assist for transportation;Help with stairs or ramp for entrance    Functional Status Assessment  Patient has had a recent decline in their functional status and demonstrates the ability to make significant improvements in function in a reasonable and predictable amount of time.  Equipment Recommendations  None recommended by OT    Recommendations for Other Services       Precautions / Restrictions Precautions Precautions: Fall Restrictions Weight Bearing Restrictions: No      Mobility Bed Mobility Overal bed mobility: Needs Assistance Bed Mobility: Supine to Sit     Supine to sit: Supervision, HOB elevated          Transfers Overall transfer level: Needs assistance Equipment used: Rolling walker (2 wheels) Transfers: Sit to/from Stand Sit  to Stand: Min assist           General transfer comment: light rise assist with initial stand, seeking stability on furniture upon standing      Balance Overall balance assessment: Needs assistance, History of Falls Sitting-balance support: No upper extremity supported, Feet supported Sitting balance-Leahy Scale: Fair     Standing balance support: Bilateral upper extremity supported, During functional activity Standing balance-Leahy Scale: Poor                             ADL either performed or assessed with clinical judgement   ADL Overall ADL's : Needs assistance/impaired Eating/Feeding: Independent;Sitting   Grooming: Wash/dry hands;Min guard;Standing   Upper Body Bathing: Set up;Sitting   Lower Body Bathing: Min guard;Sit to/from stand   Upper Body Dressing : Set up;Sitting   Lower Body Dressing: Minimal assistance;Sit to/from stand   Toilet Transfer: Minimal assistance;Ambulation;Rolling walker (2 wheels) Toilet Transfer Details (indicate cue type and reason): pt hovered over toilet, she is accustomed to and elevated toilet Toileting- Clothing Manipulation and Hygiene: Min guard;Sit to/from stand       Functional mobility during ADLs: Min guard;Minimal assistance;Rolling walker (2 wheels);Cueing for safety       Vision Baseline Vision/History: 1 Wears glasses Ability to See in Adequate Light: 0 Adequate Patient Visual Report: No change from baseline       Perception     Praxis      Pertinent Vitals/Pain Pain Assessment Pain Assessment: No/denies pain     Hand Dominance Right   Extremity/Trunk Assessment Upper Extremity Assessment  Upper Extremity Assessment: Overall WFL for tasks assessed   Lower Extremity Assessment Lower Extremity Assessment: Defer to PT evaluation   Cervical / Trunk Assessment Cervical / Trunk Assessment: Normal   Communication Communication Communication: HOH   Cognition Arousal/Alertness:  Awake/alert Behavior During Therapy: WFL for tasks assessed/performed Overall Cognitive Status: No family/caregiver present to determine baseline cognitive functioning                                 General Comments: Highly distractible, safety deficits throughout mobility with use of RW     General Comments       Exercises     Shoulder Instructions      Home Living Family/patient expects to be discharged to:: Private residence Living Arrangements: Alone Available Help at Discharge: Family;Available PRN/intermittently Type of Home: Mobile home Home Access: Ramped entrance     Home Layout: One level     Bathroom Shower/Tub: Chief Strategy Officer: Handicapped height     Home Equipment: Rollator (4 wheels);Rolling Walker (2 wheels);Shower seat;Cane - single point          Prior Functioning/Environment Prior Level of Function : Needs assist             Mobility Comments: pt reports using cane in the house and RW outside of the house, pt does not drive so her grandson takes her to appointments ADLs Comments: independent in ADLs and IADLs        OT Problem List: Impaired balance (sitting and/or standing);Decreased safety awareness;Decreased knowledge of use of DME or AE      OT Treatment/Interventions: Self-care/ADL training;DME and/or AE instruction;Patient/family education;Balance training    OT Goals(Current goals can be found in the care plan section) Acute Rehab OT Goals OT Goal Formulation: With patient Time For Goal Achievement: 12/11/21 Potential to Achieve Goals: Good ADL Goals Pt Will Perform Grooming: with modified independence;standing Pt Will Perform Lower Body Bathing: with modified independence;sit to/from stand Pt Will Perform Lower Body Dressing: with modified independence;sit to/from stand Pt Will Transfer to Toilet: with modified independence;ambulating;bedside commode Pt Will Perform Toileting - Clothing  Manipulation and hygiene: with modified independence;sit to/from stand Additional ADL Goal #1: Pt will state at least 3 strategies to prevent falls.  OT Frequency: Min 2X/week    Co-evaluation              AM-PAC OT "6 Clicks" Daily Activity     Outcome Measure Help from another person eating meals?: None Help from another person taking care of personal grooming?: A Little Help from another person toileting, which includes using toliet, bedpan, or urinal?: A Little Help from another person bathing (including washing, rinsing, drying)?: A Little Help from another person to put on and taking off regular upper body clothing?: None Help from another person to put on and taking off regular lower body clothing?: A Little 6 Click Score: 20   End of Session Equipment Utilized During Treatment: Gait belt;Rolling walker (2 wheels)  Activity Tolerance: Patient tolerated treatment well Patient left: in chair;with call bell/phone within reach;with chair alarm set  OT Visit Diagnosis: Unsteadiness on feet (R26.81);Other abnormalities of gait and mobility (R26.89);Muscle weakness (generalized) (M62.81)                Time: 8099-8338 OT Time Calculation (min): 26 min Charges:  OT General Charges $OT Visit: 1 Visit OT Evaluation $OT Eval Moderate Complexity: 1 Mod OT  Treatments $Self Care/Home Management : 8-22 mins  Berna Spare, OTR/L Acute Rehabilitation Services Office: [phone]   Evern Bio 11/27/2021, 10:27 AM

## 2021-11-28 DIAGNOSIS — G9341 Metabolic encephalopathy: Secondary | ICD-10-CM | POA: Diagnosis not present

## 2021-11-28 LAB — GLUCOSE, CAPILLARY
Glucose-Capillary: 111 mg/dL — ABNORMAL HIGH (ref 70–99)
Glucose-Capillary: 144 mg/dL — ABNORMAL HIGH (ref 70–99)

## 2021-11-28 NOTE — Discharge Summary (Signed)
DISCHARGE SUMMARY  SARA KEYS  MR#: [id number]  DOB:June 28, 1945  Date of Admission: 11/25/2021 Date of Discharge: 11/28/2021  Attending Physician:Valda Christenson Silvestre Gunner, MD  Patient's XLK:GMWNUU, Curly Rim, MD  Consults: None  Disposition: Discharge home  Follow-up Appts:  Follow-up Information     Kaleen Mask, MD Follow up.   Specialty: Family Medicine Contact information: [address] [place] [address] [phone]                 Discharge Diagnoses: Acute metabolic encephalopathy of unknown etiology Abdominal pain Morbid obesity - There is no height or weight on file to calculate BMI. Essential hypertension Diabetes mellitus type 2  Initial presentation: 76 year old with a history of DM2, HLD, and HTN who was brought to the ER via EMS with altered mental status. The patient's son arrived at her home at 7:30 PM and found her to be confused. In the ER a UA was unrevealing and a CXR was normal. CT head noted no acute intracranial abnormalities but did note small vessel ischemic disease and generalized cerebral atrophy.  Hospital Course:  Acute metabolic encephalopathy of unknown etiology she is not on any medicines that would alter her mentation - CT head was negative - ammonia normal - support w/ hydration and monitor - PT/OT evals -mental status rapidly improved after admission -work-up unrevealing -no recurrent episodes during hospital stay -CBG stable throughout hospital stay -did well with PT/OT evaluations with the recommendation for home health PT OT to continue -cleared for discharge 11/28/2021   Abdominal pain CT abdomen/pelvis without acute findings -resolved quickly with no recurrence during remainder of hospital stay   Morbid obesity - There is no height or weight on file to calculate BMI.   Essential hypertension Resume usual home medications at time of discharge   Diabetes mellitus type 2 A1c 6.3 -no evidence of  significant hypoglycemia at this admission and not on medications that are classically implicated in severe hypoglycemia -no change in treatment regimen at time of discharge  Allergies as of 11/28/2021       Reactions   Codeine Other (See Comments)   Numbness and tingling   Contrast Media [iodinated Contrast Media] Hives   Vistaril [hydroxyzine] Other (See Comments)   hallucinations        Medication List     TAKE these medications    acetaminophen 650 MG CR tablet Commonly known as: TYLENOL Take 650 mg by mouth as needed for pain.   Januvia 50 MG tablet Generic drug: sitaGLIPtin Take 50 mg by mouth daily.   lisinopril 20 MG tablet Commonly known as: ZESTRIL Take 20 mg by mouth daily.   OVER THE COUNTER MEDICATION Take 1 tablet by mouth daily. Eye vitamin   pantoprazole 20 MG tablet Commonly known as: PROTONIX Take 20 mg by mouth 2 (two) times daily.   saccharomyces boulardii 250 MG capsule Commonly known as: FLORASTOR Take 250 mg by mouth daily.   VITAMIN K2-VITAMIN D3 PO        Day of Discharge BP (!) 155/66 (BP Location: Right Arm)   Pulse 61   Temp 97.8 F (36.6 C) (Oral)   Resp 15   SpO2 96%   Physical Exam: General: No acute respiratory distress Lungs: Clear to auscultation bilaterally without wheezes or crackles Cardiovascular: Regular rate and rhythm without murmur gallop or rub normal S1 and S2 Abdomen: Nontender, nondistended, soft, bowel sounds positive, no rebound, no ascites, no appreciable mass Extremities: No significant cyanosis, clubbing,  or edema bilateral lower extremities  Basic Metabolic Panel: Recent Labs  Lab 11/26/21 0013 11/27/21 0745  NA 138 140  K 3.9 3.5  CL 104 107  CO2 20* 26  GLUCOSE 139* 91  BUN 19 12  CREATININE 1.25* 1.13*  CALCIUM 9.4 8.9   CBC: Recent Labs  Lab 11/26/21 0013 11/27/21 0745  WBC 12.5* 6.9  NEUTROABS 10.2*  --   HGB 14.4 13.3  HCT 43.4 40.0  MCV 89.1 89.5  PLT 321 281    Time  spent in discharge (includes decision making & examination of pt): 30 minutes  11/28/2021, 11:55 AM   Lonia Blood, MD [facility] Office  [phone]

## 2021-12-01 LAB — CULTURE, BLOOD (ROUTINE X 2)
Culture: NO GROWTH
Culture: NO GROWTH
Special Requests: ADEQUATE

## 2023-01-25 ENCOUNTER — Encounter (HOSPITAL_COMMUNITY): Payer: Self-pay

## 2023-01-25 ENCOUNTER — Emergency Department (HOSPITAL_COMMUNITY): Payer: Medicare PPO

## 2023-01-25 ENCOUNTER — Other Ambulatory Visit: Payer: Self-pay

## 2023-01-25 ENCOUNTER — Emergency Department (HOSPITAL_COMMUNITY)
Admission: EM | Admit: 2023-01-25 | Discharge: 2023-01-26 | Disposition: A | Discharge status: Home / Self Care | Payer: Medicare PPO | Attending: Emergency Medicine | Admitting: Emergency Medicine

## 2023-01-25 DIAGNOSIS — E119 Type 2 diabetes mellitus without complications: Secondary | ICD-10-CM | POA: Diagnosis not present

## 2023-01-25 DIAGNOSIS — I10 Essential (primary) hypertension: Secondary | ICD-10-CM | POA: Diagnosis not present

## 2023-01-25 DIAGNOSIS — R1030 Lower abdominal pain, unspecified: Secondary | ICD-10-CM | POA: Insufficient documentation

## 2023-01-25 DIAGNOSIS — Z7984 Long term (current) use of oral hypoglycemic drugs: Secondary | ICD-10-CM | POA: Diagnosis not present

## 2023-01-25 DIAGNOSIS — Z79899 Other long term (current) drug therapy: Secondary | ICD-10-CM | POA: Diagnosis not present

## 2023-01-25 DIAGNOSIS — K625 Hemorrhage of anus and rectum: Secondary | ICD-10-CM | POA: Insufficient documentation

## 2023-01-25 DIAGNOSIS — R300 Dysuria: Secondary | ICD-10-CM | POA: Insufficient documentation

## 2023-01-25 LAB — URINALYSIS, ROUTINE W REFLEX MICROSCOPIC
Bilirubin Urine: NEGATIVE
Glucose, UA: NEGATIVE mg/dL
Hgb urine dipstick: NEGATIVE
Ketones, ur: 20 mg/dL — AB
Nitrite: NEGATIVE
Protein, ur: NEGATIVE mg/dL
Specific Gravity, Urine: 1.025 (ref 1.005–1.030)
pH: 5 (ref 5.0–8.0)

## 2023-01-25 LAB — COMPREHENSIVE METABOLIC PANEL
ALT: 16 U/L (ref 0–44)
AST: 22 U/L (ref 15–41)
Albumin: 4.1 g/dL (ref 3.5–5.0)
Alkaline Phosphatase: 68 U/L (ref 38–126)
Anion gap: 11 (ref 5–15)
BUN: 15 mg/dL (ref 8–23)
CO2: 22 mmol/L (ref 22–32)
Calcium: 9.2 mg/dL (ref 8.9–10.3)
Chloride: 108 mmol/L (ref 98–111)
Creatinine, Ser: 0.9 mg/dL (ref 0.44–1.00)
GFR, Estimated: >60 mL/min (ref >60)
Glucose, Bld: 120 mg/dL — ABNORMAL HIGH (ref 70–99)
Potassium: 3.6 mmol/L (ref 3.5–5.1)
Sodium: 141 mmol/L (ref 135–145)
Total Bilirubin: 0.8 mg/dL (ref 0.3–1.2)
Total Protein: 7 g/dL (ref 6.5–8.1)

## 2023-01-25 LAB — CBC
HCT: 43.1 % (ref 36.0–46.0)
Hemoglobin: 14.4 g/dL (ref 12.0–15.0)
MCH: 30.1 pg (ref 26.0–34.0)
MCHC: 33.4 g/dL (ref 30.0–36.0)
MCV: 90.2 fL (ref 80.0–100.0)
Platelets: 341 10*3/uL (ref 150–400)
RBC: 4.78 MIL/uL (ref 3.87–5.11)
RDW: 13.6 % (ref 11.5–15.5)
WBC: 9.2 10*3/uL (ref 4.0–10.5)
nRBC: 0 % (ref 0.0–0.2)

## 2023-01-25 LAB — LIPASE, BLOOD: Lipase: 32 U/L (ref 11–51)

## 2023-01-25 LAB — POC OCCULT BLOOD, ED: Fecal Occult Bld: POSITIVE — AB

## 2023-01-25 NOTE — ED Triage Notes (Addendum)
Pt is coming from home. Pt complaining of pelvic/lower stomach pain, lower back pain, endorses pain with urination, and foul smell in urine. Pt expressing blood in stool too.

## 2023-01-25 NOTE — ED Provider Notes (Signed)
[place] EMERGENCY DEPARTMENT AT [facility] Provider Note   CSN: [id number] Arrival date & time: 01/25/23  1649     History  Chief Complaint  Patient presents with   Dysuria   Back Pain   Melena         Jasmine Cohen is a 77 y.o. female with PMH as listed below who presents with dysuria, lower abdominal pain, and bloody BMs that started last week. Had urine that was dark and red in color, possibly bloody as well, with foul odor. Possible tactile fever at home but didn't check it. Has h/o cholecystectomy. No h/o GIB. Has had colonoscopies in the past and found two polyps before. Endorses some lightheadedness/wooziness. No BMs that are frank blood. Blood is bright red and mixed in with stool. Has had 2-3 BMs.    Past Medical History:  Diagnosis Date   Diabetes mellitus without complication (HCC)    GERD (gastroesophageal reflux disease)    Hyperlipidemia    Hypertension        Home Medications Prior to Admission medications   Medication Sig Start Date End Date Taking? Authorizing Provider  acetaminophen (TYLENOL) 650 MG CR tablet Take 650 mg by mouth as needed for pain.     [provider]  JANUVIA 50 MG tablet Take 50 mg by mouth daily. 02/28/20   [provider]  lisinopril (PRINIVIL,ZESTRIL) 20 MG tablet Take 20 mg by mouth daily.     [provider]  OVER THE COUNTER MEDICATION Take 1 tablet by mouth daily. Eye vitamin    [provider]  pantoprazole (PROTONIX) 20 MG tablet Take 20 mg by mouth 2 (two) times daily.    [provider]  saccharomyces boulardii (FLORASTOR) 250 MG capsule Take 250 mg by mouth daily.    [provider]  Vitamin D-Vitamin K (VITAMIN K2-VITAMIN D3 PO)     [provider]      Allergies    Codeine, Contrast media [iodinated contrast media], and Vistaril [hydroxyzine]    Review of Systems   Review of Systems A 10 point review of systems was performed and is  negative unless otherwise reported in HPI.  Physical Exam Updated Vital Signs BP (!) 161/85 (BP Location: Left Arm)   Pulse 75   Temp 97.8 F (36.6 C) (Oral)   Resp 16   Ht 5\' 7"  (1.702 m)   SpO2 100%   BMI 38.84 kg/m  Physical Exam General: Normal appearing female, lying in bed.  HEENT:  Sclera anicteric, MMM, trachea midline.  Cardiology: RRR, no murmurs/rubs/gallops.  Resp: Normal respiratory rate and effort. CTAB, no wheezes, rhonchi, crackles.  Abd: Soft, mild suprapubic tenderness palpation, non-distended. No rebound tenderness or guarding.  Rectal: Performed with nurse chaperone normal-appearing external anal sphincter, no anal fissure or hemorrhoids noted.  No internal hemorrhoids or masses palpated.  No fluctuance or induration. MSK: No peripheral edema or signs of trauma. Skin: warm, dry.  Back: No CVA tenderness Neuro: A&Ox4, CNs II-XII grossly intact. MAEs. Sensation grossly intact.  Psych: Normal mood and affect.   ED Results / Procedures / Treatments   Labs (all labs ordered are listed, but only abnormal results are displayed) Labs Reviewed  COMPREHENSIVE METABOLIC PANEL - Abnormal; Notable for the following components:   Glucose, Bld 120 (*)    All other components within normal limits  URINALYSIS, ROUTINE W REFLEX MICROSCOPIC - Abnormal; Notable for the following components:   Ketones, ur 20 (*)  Leukocytes,Ua SMALL (*)    Bacteria, UA RARE (*)    All other components within normal limits  POC OCCULT BLOOD, ED - Abnormal; Notable for the following components:   Fecal Occult Bld POSITIVE (*)    All other components within normal limits  LIPASE, BLOOD  CBC    EKG None  Radiology No results found.  Procedures Procedures    Medications Ordered in ED Medications  cephALEXin (KEFLEX) capsule 250 mg (250 mg Oral Given 01/26/23 0027)    ED Course/ Medical Decision Making/ A&P                          Medical Decision Making Amount and/or  Complexity of Data Reviewed Labs: ordered. Decision-making details documented in ED Course. Radiology: ordered. Decision-making details documented in ED Course.  Risk Prescription drug management.    This patient presents to the ED for concern of rectal bleeding, this involves an extensive number of treatment options, and is a complaint that carries with it a high risk of complications and morbidity.  I considered the following differential and admission for this acute, potentially life threatening condition.  Patient is hemodynamically stable albeit mildly hypertensive and very well-appearing.  MDM:    Based on clinical presentation, hematochezia reported and clinical exam history and findings, most concerned about LGIB.   DDx for LGIB includes but is not limited to: No hemorrhoids, anal fissure, rectal foreign body noted. Lower c/f ischemic colitis, IBD given no abd pain out of proportion to exam and no h/o IBD. Consider AVMs or Dieulafoy lesions, infectious colitis, malignancy. For her dysuria, consider cystitis at the top of differential.  No vaginal symptoms to indicate vulvovaginal candidiasis, doubt urethritis.  No flank pain to indicate pyelonephritis.  She has mild suprapubic tenderness palpation but no overt right lower quadrant or left lower quadrant tenderness, however given her report of rectal bleeding will consider diverticulitis or appendicitis and will get a CT abdomen pelvis.  She does indicate some lightheadedness however she is very well-appearing and hemodynamically stable, low concern for GI hemorrhage or hypovolemia.  Will also assess to make sure she does not have acute blood loss anemia.   Clinical Course as of 02/02/23 0709  Wed Jan 26, 2023  0000 Fecal Occult Blood, POC(!): POSITIVE [HN]  0000 Comprehensive metabolic panel(!) Unremarkable in context [HN]  0001 CBC wnl [HN]  0001 Lipase: 32 neg [HN]  0001 Urinalysis, Routine w reflex microscopic -Urine, Clean  Catch(!) +ketonuria, small leukocytes, 6-10 WBC, rare bacteria with mucus. Patient is having several symptoms of UTI including dysuria/frequency and suprapubic pain. She denies vaginal symptoms.  [HN]  0002 CT ABDOMEN PELVIS WO CONTRAST 1. Negative for hydronephrosis or nephrolithiasis. 2. 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 3. Aortic atherosclerosis.   [HN]  0014 Will treat for UTI and get culture. No WBC or fever to indicate bacteremia or serious bacterial infection. Patient is overall very well-appearing. Encouraged patient to f/u with PCP within 1 week. Additionally, for her rectal bleeding, she has no drop in Hgb and she is vitally stable. Recommended f/u with GI for evaluation and possible colonoscopy.   [HN]    Clinical Course User Index [HN] Loetta Rough, MD    Labs: I Ordered, and personally interpreted labs.  The pertinent results include:  those listed above  Imaging Studies ordered: I ordered imaging studies including CT abd pelvis I independently visualized and interpreted imaging. I agree with  the radiologist interpretation  Additional history obtained from chart review.    Cardiac Monitoring: The patient was maintained on a cardiac monitor.  I personally viewed and interpreted the cardiac monitored which showed an underlying rhythm of: NSR  Reevaluation: After the interventions noted above, I reevaluated the patient and found that they have :improved  Social Determinants of Health: Lives independently  Disposition:  DC w/ discharge instructions/return precautions. All questions answered to patient's satisfaction.    Co morbidities that complicate the patient evaluation  Past Medical History:  Diagnosis Date   Diabetes mellitus without complication (HCC)    GERD (gastroesophageal reflux disease)    Hyperlipidemia    Hypertension      Medicines No orders of the defined types were placed in this encounter.   I have reviewed the  patients home medicines and have made adjustments as needed  Problem List / ED Course: Problem List Items Addressed This Visit   None Visit Diagnoses     Dysuria    -  Primary   Rectal bleeding                       This note was created using dictation software, which may contain spelling or grammatical errors.    Loetta Rough, MD 02/02/23 [phone]

## 2023-01-26 MED ORDER — CEPHALEXIN 500 MG PO CAPS: 500.0000 mg | ORAL_CAPSULE | Freq: Four times a day (QID) | ORAL | 0 refills | Status: DC

## 2023-01-26 MED ORDER — CEPHALEXIN 250 MG PO CAPS
250.0000 mg | ORAL_CAPSULE | Freq: Once | ORAL | Status: AC
  Administered 2023-01-26: 250 mg via ORAL
  Filled 2023-01-26: qty 1

## 2023-01-26 NOTE — ED Notes (Signed)
Have tried several times to get in contact with family to pick up patient from discharge.

## 2023-01-26 NOTE — Discharge Instructions (Addendum)
Thank you for coming to [facility] Emergency Department. You were seen for painful urination, abdominal pain, and . We did an exam, labs, and imaging, and these showed a possible urinary tract infection, especially given your symptoms. It also showed your rectal bleeding. Please take keflex four times per day for 5 days for your urine infection. Please follow up with your primary care provider within 1 week. Please also follow up with your gastroenterologist within 2 weeks about your rectal bleeding. They may want to do another colonoscopy.   Do not hesitate to return to the ED or call 911 if you experience: -Worsening symptoms -Worsening rectal bleeding -Worsening abdominal pain -Nausea/vomiting so severe you cannot eat/drink anything -Lightheadedness, passing out -Fevers/chills -Anything else that concerns you

## 2023-01-27 LAB — URINE CULTURE: Culture: <10000 — AB

## 2023-08-09 ENCOUNTER — Emergency Department (HOSPITAL_COMMUNITY)
Admission: EM | Admit: 2023-08-09 | Discharge: 2023-08-09 | Disposition: A | Discharge status: Home / Self Care | Attending: Student | Admitting: Student

## 2023-08-09 ENCOUNTER — Encounter (HOSPITAL_COMMUNITY): Payer: Self-pay

## 2023-08-09 ENCOUNTER — Emergency Department (HOSPITAL_COMMUNITY)

## 2023-08-09 ENCOUNTER — Other Ambulatory Visit: Payer: Self-pay

## 2023-08-09 DIAGNOSIS — D72829 Elevated white blood cell count, unspecified: Secondary | ICD-10-CM | POA: Diagnosis not present

## 2023-08-09 DIAGNOSIS — K5792 Diverticulitis of intestine, part unspecified, without perforation or abscess without bleeding: Secondary | ICD-10-CM | POA: Diagnosis not present

## 2023-08-09 DIAGNOSIS — R1084 Generalized abdominal pain: Secondary | ICD-10-CM | POA: Diagnosis present

## 2023-08-09 LAB — CBC WITH DIFFERENTIAL/PLATELET
Abs Immature Granulocytes: 0.04 10*3/uL (ref 0.00–0.07)
Basophils Absolute: 0.1 10*3/uL (ref 0.0–0.1)
Basophils Relative: 1 %
Eosinophils Absolute: 0.3 10*3/uL (ref 0.0–0.5)
Eosinophils Relative: 3 %
HCT: 41.4 % (ref 36.0–46.0)
Hemoglobin: 13.5 g/dL (ref 12.0–15.0)
Immature Granulocytes: 0 %
Lymphocytes Relative: 15 %
Lymphs Abs: 1.7 10*3/uL (ref 0.7–4.0)
MCH: 29 pg (ref 26.0–34.0)
MCHC: 32.6 g/dL (ref 30.0–36.0)
MCV: 89 fL (ref 80.0–100.0)
Monocytes Absolute: 0.9 10*3/uL (ref 0.1–1.0)
Monocytes Relative: 8 %
Neutro Abs: 8.3 10*3/uL — ABNORMAL HIGH (ref 1.7–7.7)
Neutrophils Relative %: 73 %
Platelets: 340 10*3/uL (ref 150–400)
RBC: 4.65 MIL/uL (ref 3.87–5.11)
RDW: 13.6 % (ref 11.5–15.5)
WBC: 11.3 10*3/uL — ABNORMAL HIGH (ref 4.0–10.5)
nRBC: 0 % (ref 0.0–0.2)

## 2023-08-09 LAB — URINALYSIS, W/ REFLEX TO CULTURE (INFECTION SUSPECTED)
Bilirubin Urine: NEGATIVE
Glucose, UA: NEGATIVE mg/dL
Hgb urine dipstick: NEGATIVE
Ketones, ur: NEGATIVE mg/dL
Nitrite: NEGATIVE
Protein, ur: NEGATIVE mg/dL
Specific Gravity, Urine: 1.012 (ref 1.005–1.030)
pH: 8 (ref 5.0–8.0)

## 2023-08-09 LAB — COMPREHENSIVE METABOLIC PANEL WITH GFR
ALT: 17 U/L (ref 0–44)
AST: 27 U/L (ref 15–41)
Albumin: 3.4 g/dL — ABNORMAL LOW (ref 3.5–5.0)
Alkaline Phosphatase: 63 U/L (ref 38–126)
Anion gap: 12 (ref 5–15)
BUN: 21 mg/dL (ref 8–23)
CO2: 24 mmol/L (ref 22–32)
Calcium: 9.2 mg/dL (ref 8.9–10.3)
Chloride: 105 mmol/L (ref 98–111)
Creatinine, Ser: 1.05 mg/dL — ABNORMAL HIGH (ref 0.44–1.00)
GFR, Estimated: 55 mL/min — ABNORMAL LOW (ref >60)
Glucose, Bld: 107 mg/dL — ABNORMAL HIGH (ref 70–99)
Potassium: 4 mmol/L (ref 3.5–5.1)
Sodium: 141 mmol/L (ref 135–145)
Total Bilirubin: 0.5 mg/dL (ref 0.0–1.2)
Total Protein: 6.2 g/dL — ABNORMAL LOW (ref 6.5–8.1)

## 2023-08-09 LAB — LIPASE, BLOOD: Lipase: 36 U/L (ref 11–51)

## 2023-08-09 MED ORDER — FENTANYL CITRATE PF 50 MCG/ML IJ SOSY
50.0000 ug | PREFILLED_SYRINGE | Freq: Once | INTRAMUSCULAR | Status: AC
  Administered 2023-08-09: 50 ug via INTRAVENOUS
  Filled 2023-08-09: qty 1

## 2023-08-09 MED ORDER — SODIUM CHLORIDE 0.9 % IV SOLN
2.0000 g | Freq: Once | INTRAVENOUS | Status: AC
  Administered 2023-08-09: 2 g via INTRAVENOUS
  Filled 2023-08-09: qty 20

## 2023-08-09 MED ORDER — ONDANSETRON 4 MG PO TBDP: 4.0000 mg | ORAL_TABLET | Freq: Three times a day (TID) | ORAL | 0 refills | Status: AC | PRN

## 2023-08-09 MED ORDER — AMOXICILLIN-POT CLAVULANATE 875-125 MG PO TABS: 1.0000 | ORAL_TABLET | Freq: Two times a day (BID) | ORAL | 0 refills | Status: AC

## 2023-08-09 MED ORDER — ONDANSETRON HCL 4 MG/2ML IJ SOLN
4.0000 mg | Freq: Once | INTRAMUSCULAR | Status: AC
  Administered 2023-08-09: 4 mg via INTRAVENOUS
  Filled 2023-08-09: qty 2

## 2023-08-09 MED ORDER — METRONIDAZOLE 500 MG/100ML IV SOLN
500.0000 mg | Freq: Once | INTRAVENOUS | Status: AC
  Administered 2023-08-09: 500 mg via INTRAVENOUS
  Filled 2023-08-09: qty 100

## 2023-08-09 MED ORDER — OXYCODONE HCL 5 MG PO TABS: 5.0000 mg | ORAL_TABLET | ORAL | 0 refills | Status: AC | PRN

## 2023-08-09 MED ORDER — DICYCLOMINE HCL 20 MG PO TABS: 20.0000 mg | ORAL_TABLET | Freq: Two times a day (BID) | ORAL | 0 refills | Status: AC

## 2023-08-09 NOTE — ED Triage Notes (Signed)
 Presents to ED via EMS from home with diffuse abd pain. Awoke at 0100 with severe abd pain, groin pain on right radiates down right leg. Recent UTI approx 3 months ago. Hx diverticulitis, seen 2 weeks ago by PCP for constipation, increased fluid, fiber; continues to have small bowel movements. Endorses nausea. Expectorating phlegm.  Temp 98.5.

## 2023-08-09 NOTE — ED Notes (Signed)
 Ambulated pt, pt dressed herself, and ambulated well. PA notified.

## 2023-08-09 NOTE — Discharge Instructions (Addendum)
 It was a pleasure taking care of you here in the emergency department.  Your CT scan showed diverticulitis.  We have started you on antibiotics.  Please take as prescribed.  I have also written for some Zofran  which is a nausea medication  I have written you for Bentyl which is an abdominal antispasm medicine  I have written you for a short course of pain medicine, oxycodone.  Please use caution as this medication may make you sleepy.  It will also increase your constipation.  Make sure to follow-up with Dr. Tova Fresh your gastroenterologist at the know you were seen here in the emergency department  Return for new or worsening symptoms

## 2023-08-09 NOTE — ED Provider Notes (Signed)
 [place] EMERGENCY DEPARTMENT AT [facility] Provider Note   CSN: [id number] Arrival date & time: 08/09/23  1548    History  Chief Complaint  Patient presents with   Abdominal Pain    Jasmine Cohen is a 78 y.o. female here for evaluation of mental pain.  Has had some generalized abdominal pain over the last few weeks.  Severely worsening today.  Diffuse lower abdominal pain which radiates into her right groin.  She has known chronic issues to her right leg was told she needed surgery for "bone-on-bone."  States she had a UTI about 3 months ago, states she was also diagnosed with diverticulitis about 2 weeks ago however was not prescribed antibiotics was prescribed stool softeners, oral fiber supplementation told to increase her fluid intake.  She continues to have small bowel movements however states this morning she had a "normal" bowel movement.  Some nausea without emesis.  No fever.  Difficult to move.  No numbness or weakness.  No chest pain, shortness of breath. No melena or BRBPR.  HPI     Home Medications Prior to Admission medications   Medication Sig Start Date End Date Taking? Authorizing Provider  amoxicillin -clavulanate (AUGMENTIN ) 875-125 MG tablet Take 1 tablet by mouth every 12 (twelve) hours. 08/09/23  Yes Keymari Sato A, PA-C  dicyclomine (BENTYL) 20 MG tablet Take 1 tablet (20 mg total) by mouth 2 (two) times daily. 08/09/23  Yes Neal Oshea A, PA-C  ondansetron  (ZOFRAN -ODT) 4 MG disintegrating tablet Take 1 tablet (4 mg total) by mouth every 8 (eight) hours as needed. 08/09/23  Yes Ieasha Boerema A, PA-C  oxyCODONE (ROXICODONE) 5 MG immediate release tablet Take 1 tablet (5 mg total) by mouth every 4 (four) hours as needed for severe pain (pain score 7-10). 08/09/23  Yes Abednego Yeates A, PA-C  acetaminophen  (TYLENOL ) 650 MG CR tablet Take 650 mg by mouth as needed for pain.     [provider]  JANUVIA 50 MG tablet Take 50 mg by mouth  daily. 02/28/20   [provider]  lisinopril  (PRINIVIL ,ZESTRIL ) 20 MG tablet Take 20 mg by mouth daily.     [provider]  OVER THE COUNTER MEDICATION Take 1 tablet by mouth daily. Eye vitamin    [provider]  pantoprazole  (PROTONIX ) 20 MG tablet Take 20 mg by mouth 2 (two) times daily.    [provider]  saccharomyces boulardii (FLORASTOR) 250 MG capsule Take 250 mg by mouth daily.    [provider]  Vitamin D-Vitamin K (VITAMIN K2-VITAMIN D3 PO)     [provider]      Allergies    Codeine, Contrast media [iodinated contrast media], and Vistaril [hydroxyzine]    Review of Systems   Review of Systems  Constitutional: Negative.   HENT: Negative.    Respiratory: Negative.    Cardiovascular: Negative.   Gastrointestinal:  Positive for abdominal pain, constipation, nausea and vomiting. Negative for abdominal distention, anal bleeding, blood in stool, diarrhea and rectal pain.  Genitourinary: Negative.   Musculoskeletal:  Positive for gait problem (chronic). Negative for back pain.       Chronic left leg pain  Skin: Negative.   All other systems reviewed and are negative.   Physical Exam Updated Vital Signs BP (!) 150/71   Pulse 65   Temp 98.7 F (37.1 C) (Oral)   Resp (!) 21   Ht 5' 7.5" (1.715 m)   Wt 101.6 kg   SpO2  99%   BMI 34.57 kg/m  Physical Exam Vitals and nursing note reviewed.  Constitutional:      General: She is not in acute distress.    Appearance: She is well-developed. She is not ill-appearing, toxic-appearing or diaphoretic.  HENT:     Head: Normocephalic and atraumatic.  Eyes:     Pupils: Pupils are equal, round, and reactive to light.  Cardiovascular:     Rate and Rhythm: Normal rate.     Pulses: Normal pulses.          Radial pulses are 2+ on the right side and 2+ on the left side.       Dorsalis pedis pulses are 2+ on the right side and 2+ on the left side.     Heart sounds: Normal heart  sounds.  Pulmonary:     Effort: Pulmonary effort is normal. No respiratory distress.     Breath sounds: Normal breath sounds.  Abdominal:     General: Bowel sounds are normal. There is no distension.     Palpations: Abdomen is soft.     Tenderness: There is generalized abdominal tenderness and tenderness in the right lower quadrant, periumbilical area, suprapubic area and left lower quadrant. There is no right CVA tenderness, left CVA tenderness, guarding or rebound. Negative signs include Murphy's sign and McBurney's sign.     Hernia: No hernia is present.       Comments: Diffuse tenderness, no guarding.  No obvious hernias.  Musculoskeletal:        General: Normal range of motion.     Cervical back: Normal range of motion.     Comments: Decreased range of motion right lower extremity-patient states chronic.  No lower extremity edema, redness or warmth.  Compartments are soft.  Skin:    General: Skin is warm and dry.  Neurological:     General: No focal deficit present.     Mental Status: She is alert.  Psychiatric:        Mood and Affect: Mood normal.     ED Results / Procedures / Treatments   Labs (all labs ordered are listed, but only abnormal results are displayed) Labs Reviewed  CBC WITH DIFFERENTIAL/PLATELET - Abnormal; Notable for the following components:      Result Value   WBC 11.3 (*)    Neutro Abs 8.3 (*)    All other components within normal limits  COMPREHENSIVE METABOLIC PANEL WITH GFR - Abnormal; Notable for the following components:   Glucose, Bld 107 (*)    Creatinine, Ser 1.05 (*)    Total Protein 6.2 (*)    Albumin  3.4 (*)    GFR, Estimated 55 (*)    All other components within normal limits  URINALYSIS, W/ REFLEX TO CULTURE (INFECTION SUSPECTED) - Abnormal; Notable for the following components:   APPearance CLOUDY (*)    Leukocytes,Ua SMALL (*)    Bacteria, UA FEW (*)    All other components within normal limits  LIPASE, BLOOD     EKG None  Radiology CT ABDOMEN PELVIS WO CONTRAST Result Date: 08/09/2023 CLINICAL DATA:  Abdominal pain. EXAM: CT ABDOMEN AND PELVIS WITHOUT CONTRAST TECHNIQUE: Multidetector CT imaging of the abdomen and pelvis was performed following the standard protocol without IV contrast. RADIATION DOSE REDUCTION: This exam was performed according to the departmental dose-optimization program which includes automated exposure control, adjustment of the mA and/or kV according to patient size and/or use of iterative reconstruction technique. COMPARISON:  CT abdomen pelvis dated  01/25/2023. FINDINGS: Evaluation of this exam is limited in the absence of intravenous contrast. Lower chest: The visualized lung bases are clear. No intra-abdominal free air or free fluid. Hepatobiliary: The liver is unremarkable. No biliary dilatation. Cholecystectomy. Pancreas: Unremarkable. No pancreatic ductal dilatation or surrounding inflammatory changes. Spleen: Normal in size without focal abnormality. Adrenals/Urinary Tract: The adrenal glands are unremarkable. There is no hydronephrosis or nephrolithiasis on either side. The visualized ureters and urinary bladder appear unremarkable. Stomach/Bowel: There is pancolonic diverticulosis. There is segmental thickening with mild inflammatory changes of the sigmoid colon (72/3) which may represent colitis or diverticulitis. A sigmoid mass is not excluded. Follow-up with colonoscopy after resolution of active symptoms recommended. There is no bowel obstruction. The appendix is normal. Vascular/Lymphatic: Mild aortoiliac atherosclerotic disease. The IVC is unremarkable. No portal venous gas. There is no adenopathy. There is nonspecific mesenteric stranding similar to prior CT. Reproductive: Hysterectomy.  No suspicious adnexal masses. Other: None Musculoskeletal: Osteopenia with degenerative changes of the spine. No acute osseous pathology. IMPRESSION: 1. Pancolonic diverticulosis. Segmental  thickening with mild inflammatory changes of the sigmoid colon may represent colitis or diverticulitis. A sigmoid mass is not excluded. Follow-up with colonoscopy after resolution of active symptoms recommended. 2. No bowel obstruction. Normal appendix. 3.  Aortic Atherosclerosis (ICD10-I70.0). Electronically Signed   By: Angus Bark M.D.   On: 08/09/2023 16:33    Procedures Procedures    Medications Ordered in ED Medications  ondansetron  (ZOFRAN ) injection 4 mg (4 mg Intravenous Given 08/09/23 1640)  fentaNYL  (SUBLIMAZE ) injection 50 mcg (50 mcg Intravenous Given 08/09/23 1641)  cefTRIAXone  (ROCEPHIN ) 2 g in sodium chloride  0.9 % 100 mL IVPB (0 g Intravenous Stopped 08/09/23 1904)    And  metroNIDAZOLE  (FLAGYL ) IVPB 500 mg (500 mg Intravenous New Bag/Given 08/09/23 1821)    ED Course/ Medical Decision Making/ A&P   78 year old here for evaluation of abdominal pain.  Has been ongoing over the last 2 weeks however severely worsened today.  Generalized pain however worsened to lower abdomen right greater than left.  She is some chronic right leg pain.  Was told she needed surgery for "bone-on-bone" to the right leg.  She has difficulty mobilizing at baseline due to her chronic leg pain.  No melena or BRBPR. She is afebrile, nonseptic, non-ill-appearing.  Will plan on labs, imaging and reassess.  Chart reviewed- Seen BY GI Dr. Tova Fresh on 07/26/23 for constipation  Labs and imaging personally viewed and interpreted:  CBC leukocytosis at 11.3 CMP creatinine 1.05 Lipase 36 UA wo infection CT abdomen pelvis colitis vs diverticulitis EKG without ischemic changes  Patient reassessed.  We discussed labs and imaging.  We discussed outpatient management versus inpatient management of diverticulitis.  She states her pain is controlled and she would like to try outpatient management if possible.  Will reassess in an hour and see if her pain is still controlled as well as her nausea and  vomiting.  Patient reassessed.  Feels better will plan on ambulating, p.o. challenge and reassess.  Patient reassessed.  Pain controlled.  Tolerating p.o. intake, ambulating.  Will write for antibiotics for her diverticulitis as well as short course of pain medicine.  Will have her follow-up with Dr. Tova Fresh her gastroenterologist.  She will return for new or worsening symptoms.  No indication of appendicitis, bowel obstruction, bowel perforation, cholecystitis, AAA, dissection, PID, pyelonephritis, UTI, radiculopathy, septic hip joint, gout, VTE, ischemia.  Patient discharged home with symptomatic treatment and given strict instructions for follow-up with their primary  care physician.  I have also discussed reasons to return immediately to the ER.  Patient expresses understanding and agrees with plan.                                  Medical Decision Making Amount and/or Complexity of Data Reviewed Independent Historian: EMS External Data Reviewed: labs, radiology, ECG and notes. Labs: ordered. Decision-making details documented in ED Course. Radiology: ordered and independent interpretation performed. Decision-making details documented in ED Course. ECG/medicine tests: ordered and independent interpretation performed. Decision-making details documented in ED Course.  Risk OTC drugs. Prescription drug management. Parenteral controlled substances. Decision regarding hospitalization. Diagnosis or treatment significantly limited by social determinants of health.          Final Clinical Impression(s) / ED Diagnoses Final diagnoses:  Diverticulitis    Rx / DC Orders ED Discharge Orders          Ordered    amoxicillin -clavulanate (AUGMENTIN ) 875-125 MG tablet  Every 12 hours        08/09/23 2016    ondansetron  (ZOFRAN -ODT) 4 MG disintegrating tablet  Every 8 hours PRN        08/09/23 2016    oxyCODONE (ROXICODONE) 5 MG immediate release tablet  Every 4 hours PRN         08/09/23 2016    dicyclomine (BENTYL) 20 MG tablet  2 times daily        08/09/23 2016              Lamichael Youkhana A, PA-C 08/09/23 2019    Kommor, Alyse July, MD 08/10/23 1212

## 2023-08-09 NOTE — ED Notes (Signed)
Tolerated ginger ale.

## 2023-08-09 NOTE — ED Notes (Signed)
 Patient transported to CT

## 2024-05-08 ENCOUNTER — Ambulatory Visit: Admitting: Podiatry

## 2024-05-09 ENCOUNTER — Ambulatory Visit: Admitting: Podiatry
